# Patient Record
Sex: Female | Born: 1970 | Race: Black or African American | Hispanic: No | Marital: Married | Smoking: Never smoker
Health system: Southern US, Community
[De-identification: ages and names within clinical notes are randomized; demographics above are authoritative.]

## PROBLEM LIST (undated history)

## (undated) DIAGNOSIS — E669 Obesity, unspecified: Secondary | ICD-10-CM

## (undated) DIAGNOSIS — Z87768 Personal history of other specified (corrected) congenital malformations of integument, limbs and musculoskeletal system: Secondary | ICD-10-CM

## (undated) DIAGNOSIS — D352 Benign neoplasm of pituitary gland: Secondary | ICD-10-CM

## (undated) DIAGNOSIS — D259 Leiomyoma of uterus, unspecified: Secondary | ICD-10-CM

## (undated) DIAGNOSIS — O26899 Other specified pregnancy related conditions, unspecified trimester: Secondary | ICD-10-CM

## (undated) DIAGNOSIS — D649 Anemia, unspecified: Secondary | ICD-10-CM

## (undated) DIAGNOSIS — Z86010 Personal history of colonic polyps: Secondary | ICD-10-CM

## (undated) DIAGNOSIS — Z8776 Personal history of (corrected) congenital malformations of integument, limbs and musculoskeletal system: Secondary | ICD-10-CM

## (undated) DIAGNOSIS — R12 Heartburn: Secondary | ICD-10-CM

## (undated) DIAGNOSIS — Z8619 Personal history of other infectious and parasitic diseases: Secondary | ICD-10-CM

## (undated) DIAGNOSIS — Z5189 Encounter for other specified aftercare: Secondary | ICD-10-CM

## (undated) DIAGNOSIS — O09529 Supervision of elderly multigravida, unspecified trimester: Secondary | ICD-10-CM

## (undated) DIAGNOSIS — Z8742 Personal history of other diseases of the female genital tract: Secondary | ICD-10-CM

## (undated) DIAGNOSIS — IMO0002 Reserved for concepts with insufficient information to code with codable children: Secondary | ICD-10-CM

## (undated) DIAGNOSIS — Z8639 Personal history of other endocrine, nutritional and metabolic disease: Secondary | ICD-10-CM

## (undated) DIAGNOSIS — A630 Anogenital (venereal) warts: Secondary | ICD-10-CM

## (undated) DIAGNOSIS — I10 Essential (primary) hypertension: Secondary | ICD-10-CM

## (undated) DIAGNOSIS — T7840XA Allergy, unspecified, initial encounter: Secondary | ICD-10-CM

## (undated) HISTORY — DX: Personal history of other specified (corrected) congenital malformations of integument, limbs and musculoskeletal system: Z87.768

## (undated) HISTORY — DX: Obesity, unspecified: E66.9

## (undated) HISTORY — DX: Personal history of other endocrine, nutritional and metabolic disease: Z86.39

## (undated) HISTORY — DX: Leiomyoma of uterus, unspecified: D25.9

## (undated) HISTORY — DX: Personal history of colonic polyps: Z86.010

## (undated) HISTORY — DX: Anogenital (venereal) warts: A63.0

## (undated) HISTORY — DX: Reserved for concepts with insufficient information to code with codable children: IMO0002

## (undated) HISTORY — DX: Personal history of other diseases of the female genital tract: Z87.42

## (undated) HISTORY — PX: TUBAL LIGATION: SHX77

## (undated) HISTORY — DX: Personal history of other infectious and parasitic diseases: Z86.19

## (undated) HISTORY — DX: Allergy, unspecified, initial encounter: T78.40XA

## (undated) HISTORY — PX: COLONOSCOPY: SHX174

## (undated) HISTORY — DX: Supervision of elderly multigravida, unspecified trimester: O09.529

## (undated) HISTORY — DX: Benign neoplasm of pituitary gland: D35.2

## (undated) HISTORY — DX: Personal history of (corrected) congenital malformations of integument, limbs and musculoskeletal system: Z87.76

## (undated) SURGERY — Surgical Case
Anesthesia: *Unknown

---

## 1993-04-07 DIAGNOSIS — Z8619 Personal history of other infectious and parasitic diseases: Secondary | ICD-10-CM

## 1993-04-07 HISTORY — DX: Personal history of other infectious and parasitic diseases: Z86.19

## 1993-04-26 DIAGNOSIS — IMO0002 Reserved for concepts with insufficient information to code with codable children: Secondary | ICD-10-CM

## 1993-04-26 DIAGNOSIS — R87619 Unspecified abnormal cytological findings in specimens from cervix uteri: Secondary | ICD-10-CM

## 1993-04-26 HISTORY — DX: Reserved for concepts with insufficient information to code with codable children: IMO0002

## 1993-04-26 HISTORY — DX: Unspecified abnormal cytological findings in specimens from cervix uteri: R87.619

## 1998-10-13 ENCOUNTER — Other Ambulatory Visit: Admission: RE | Admit: 1998-10-13 | Discharge: 1998-10-13 | Payer: Self-pay | Admitting: Obstetrics and Gynecology

## 1999-10-25 DIAGNOSIS — D259 Leiomyoma of uterus, unspecified: Secondary | ICD-10-CM

## 1999-10-25 HISTORY — DX: Leiomyoma of uterus, unspecified: D25.9

## 1999-11-10 ENCOUNTER — Other Ambulatory Visit: Admission: RE | Admit: 1999-11-10 | Discharge: 1999-11-10 | Payer: Self-pay | Admitting: Obstetrics and Gynecology

## 2000-11-01 ENCOUNTER — Other Ambulatory Visit: Admission: RE | Admit: 2000-11-01 | Discharge: 2000-11-01 | Payer: Self-pay | Admitting: Obstetrics and Gynecology

## 2002-03-06 ENCOUNTER — Other Ambulatory Visit: Admission: RE | Admit: 2002-03-06 | Discharge: 2002-03-06 | Payer: Self-pay | Admitting: Obstetrics and Gynecology

## 2002-04-26 DIAGNOSIS — Z8742 Personal history of other diseases of the female genital tract: Secondary | ICD-10-CM | POA: Insufficient documentation

## 2002-04-26 HISTORY — DX: Personal history of other diseases of the female genital tract: Z87.42

## 2002-08-25 DIAGNOSIS — Z8639 Personal history of other endocrine, nutritional and metabolic disease: Secondary | ICD-10-CM | POA: Insufficient documentation

## 2002-08-25 HISTORY — DX: Personal history of other endocrine, nutritional and metabolic disease: Z86.39

## 2003-03-07 ENCOUNTER — Other Ambulatory Visit: Admission: RE | Admit: 2003-03-07 | Discharge: 2003-03-07 | Payer: Self-pay | Admitting: Obstetrics and Gynecology

## 2003-04-27 HISTORY — PX: UTERINE FIBROID SURGERY: SHX826

## 2003-05-08 ENCOUNTER — Ambulatory Visit (HOSPITAL_COMMUNITY): Admission: RE | Admit: 2003-05-08 | Discharge: 2003-05-08 | Payer: Self-pay | Admitting: Obstetrics and Gynecology

## 2003-07-26 HISTORY — PX: MYOMECTOMY: SHX85

## 2003-08-22 ENCOUNTER — Encounter (INDEPENDENT_AMBULATORY_CARE_PROVIDER_SITE_OTHER): Payer: Self-pay | Admitting: Specialist

## 2003-08-22 ENCOUNTER — Inpatient Hospital Stay (HOSPITAL_COMMUNITY): Admission: RE | Admit: 2003-08-22 | Discharge: 2003-08-25 | Payer: Self-pay | Admitting: Obstetrics and Gynecology

## 2004-03-30 ENCOUNTER — Other Ambulatory Visit: Admission: RE | Admit: 2004-03-30 | Discharge: 2004-03-30 | Payer: Self-pay | Admitting: Obstetrics and Gynecology

## 2004-04-26 DIAGNOSIS — Z5189 Encounter for other specified aftercare: Secondary | ICD-10-CM

## 2004-04-26 DIAGNOSIS — IMO0001 Reserved for inherently not codable concepts without codable children: Secondary | ICD-10-CM

## 2004-04-26 HISTORY — DX: Encounter for other specified aftercare: Z51.89

## 2004-04-26 HISTORY — DX: Reserved for inherently not codable concepts without codable children: IMO0001

## 2004-06-11 ENCOUNTER — Ambulatory Visit (HOSPITAL_COMMUNITY): Admission: RE | Admit: 2004-06-11 | Discharge: 2004-06-11 | Payer: Self-pay | Admitting: Obstetrics and Gynecology

## 2004-06-25 ENCOUNTER — Ambulatory Visit (HOSPITAL_COMMUNITY): Admission: RE | Admit: 2004-06-25 | Discharge: 2004-06-25 | Payer: Self-pay | Admitting: Obstetrics and Gynecology

## 2004-09-09 ENCOUNTER — Ambulatory Visit (HOSPITAL_COMMUNITY): Admission: RE | Admit: 2004-09-09 | Discharge: 2004-09-09 | Payer: Self-pay | Admitting: Obstetrics and Gynecology

## 2005-01-07 ENCOUNTER — Ambulatory Visit (HOSPITAL_COMMUNITY): Admission: RE | Admit: 2005-01-07 | Discharge: 2005-01-07 | Payer: Self-pay | Admitting: Obstetrics and Gynecology

## 2005-01-25 ENCOUNTER — Inpatient Hospital Stay (HOSPITAL_COMMUNITY): Admission: RE | Admit: 2005-01-25 | Discharge: 2005-01-28 | Payer: Self-pay | Admitting: Obstetrics and Gynecology

## 2005-01-25 ENCOUNTER — Encounter (INDEPENDENT_AMBULATORY_CARE_PROVIDER_SITE_OTHER): Payer: Self-pay | Admitting: Specialist

## 2005-09-09 ENCOUNTER — Other Ambulatory Visit: Admission: RE | Admit: 2005-09-09 | Discharge: 2005-09-09 | Payer: Self-pay | Admitting: Obstetrics and Gynecology

## 2005-11-10 ENCOUNTER — Ambulatory Visit: Payer: Self-pay | Admitting: Family Medicine

## 2006-01-05 ENCOUNTER — Ambulatory Visit: Payer: Self-pay | Admitting: Family Medicine

## 2006-06-20 ENCOUNTER — Ambulatory Visit: Payer: Self-pay | Admitting: Family Medicine

## 2008-09-02 ENCOUNTER — Ambulatory Visit: Payer: Self-pay | Admitting: Occupational Medicine

## 2009-07-09 ENCOUNTER — Ambulatory Visit: Payer: Self-pay | Admitting: Family Medicine

## 2009-07-10 ENCOUNTER — Encounter: Payer: Self-pay | Admitting: Family Medicine

## 2009-07-15 ENCOUNTER — Encounter: Admission: RE | Admit: 2009-07-15 | Discharge: 2009-07-15 | Payer: Self-pay | Admitting: Obstetrics and Gynecology

## 2009-08-24 LAB — HM MAMMOGRAPHY: HM Mammogram: NORMAL

## 2010-02-16 ENCOUNTER — Encounter: Payer: Self-pay | Admitting: Internal Medicine

## 2010-02-23 ENCOUNTER — Ambulatory Visit: Payer: Self-pay | Admitting: Internal Medicine

## 2010-02-23 DIAGNOSIS — E049 Nontoxic goiter, unspecified: Secondary | ICD-10-CM | POA: Insufficient documentation

## 2010-02-24 ENCOUNTER — Ambulatory Visit: Payer: Self-pay | Admitting: Internal Medicine

## 2010-02-25 ENCOUNTER — Encounter (INDEPENDENT_AMBULATORY_CARE_PROVIDER_SITE_OTHER): Payer: Self-pay | Admitting: *Deleted

## 2010-02-26 LAB — CONVERTED CEMR LAB
AST: 15 units/L (ref 0–37)
Basophils Absolute: 0 10*3/uL (ref 0.0–0.1)
CO2: 27 meq/L (ref 19–32)
Calcium: 8.8 mg/dL (ref 8.4–10.5)
Chloride: 109 meq/L (ref 96–112)
Eosinophils Relative: 1.4 % (ref 0.0–5.0)
Free T4: 0.7 ng/dL (ref 0.60–1.60)
HDL: 42.8 mg/dL (ref >39.00)
LDL Cholesterol: 55 mg/dL (ref 0–99)
Lymphocytes Relative: 22.9 % (ref 12.0–46.0)
Monocytes Relative: 5.2 % (ref 3.0–12.0)
Platelets: 276 10*3/uL (ref 150.0–400.0)
Potassium: 4.4 meq/L (ref 3.5–5.1)
RDW: 14.2 % (ref 11.5–14.6)
Sodium: 140 meq/L (ref 135–145)
T3, Free: 3 pg/mL (ref 2.3–4.2)
TSH: 1.99 microintl units/mL (ref 0.35–5.50)
Total CHOL/HDL Ratio: 3
Triglycerides: 46 mg/dL (ref 0.0–149.0)
WBC: 7.9 10*3/uL (ref 4.5–10.5)

## 2010-03-04 ENCOUNTER — Ambulatory Visit: Payer: Self-pay | Admitting: Internal Medicine

## 2010-03-05 LAB — CONVERTED CEMR LAB: Ferritin: 110.9 ng/mL (ref 10.0–291.0)

## 2010-04-03 ENCOUNTER — Encounter (INDEPENDENT_AMBULATORY_CARE_PROVIDER_SITE_OTHER): Payer: Self-pay

## 2010-04-06 ENCOUNTER — Ambulatory Visit: Payer: Self-pay | Admitting: Emergency Medicine

## 2010-04-07 ENCOUNTER — Ambulatory Visit: Payer: Self-pay | Admitting: Internal Medicine

## 2010-04-21 ENCOUNTER — Ambulatory Visit: Payer: Self-pay | Admitting: Internal Medicine

## 2010-04-21 DIAGNOSIS — Z860100 Personal history of colon polyps, unspecified: Secondary | ICD-10-CM

## 2010-04-21 HISTORY — DX: Personal history of colonic polyps: Z86.010

## 2010-04-21 HISTORY — DX: Personal history of colon polyps, unspecified: Z86.0100

## 2010-04-22 ENCOUNTER — Encounter: Payer: Self-pay | Admitting: Internal Medicine

## 2010-05-14 ENCOUNTER — Ambulatory Visit (HOSPITAL_COMMUNITY)
Admission: RE | Admit: 2010-05-14 | Discharge: 2010-05-14 | Payer: Self-pay | Source: Home / Self Care | Attending: Obstetrics and Gynecology | Admitting: Obstetrics and Gynecology

## 2010-05-28 NOTE — Miscellaneous (Signed)
Summary: Lec previsit  Clinical Lists Changes  Medications: Added new medication of MOVIPREP 100 GM  SOLR (PEG-KCL-NACL-NASULF-NA ASC-C) As per prep instructions. - Signed Rx of MOVIPREP 100 GM  SOLR (PEG-KCL-NACL-NASULF-NA ASC-C) As per prep instructions.;  #1 x 0;  Signed;  Entered by: Ulis Rias RN;  Authorized by: Iva Boop MD, [facility];  Method used: Electronically to [facility] [phone]*, [address] [place], [place]  [address], Ph: [phone], Fax: [phone] Observations: Added new observation of NKA: T (04/07/2010 10:04)    Prescriptions: MOVIPREP 100 GM  SOLR (PEG-KCL-NACL-NASULF-NA ASC-C) As per prep instructions.  #1 x 0   Entered by:   Ulis Rias RN   Authorized by:   Iva Boop MD, [facility]   Signed by:   Ulis Rias RN on 04/07/2010   Method used:   Electronically to        [facility] [phone]* (retail)       [address] [place], [place]  [address]       Ph: [phone]       Fax: [phone]   RxID:   [id number]

## 2010-05-28 NOTE — Letter (Signed)
Summary: Pre Visit Letter Revised  [facility]  [address] [place], [place] [address]   Phone: [phone]  Fax: [phone]        02/25/2010 MRN: [id number] Felicia Monroe [address] [name], [place]  [address]             Procedure Date:  04-21-10   Welcome to the Gastroenterology Division at [facility]    You are scheduled to see a nurse for your pre-procedure visit on 04-07-10 at 10:00a.m. on the 3rd floor at [facility], [address]  We ask that you try to arrive at our office 15 minutes prior to your appointment time to allow for check-in.  Please take a minute to review the attached form.  If you answer "Yes" to one or more of the questions on the first page, we ask that you call the person listed at your earliest opportunity.  If you answer "No" to all of the questions, please complete the rest of the form and bring it to your appointment.    Your nurse visit will consist of discussing your medical and surgical history, your immediate family medical history, and your medications.   If you are unable to list all of your medications on the form, please bring the medication bottles to your appointment and we will list them.  We will need to be aware of both prescribed and over the counter drugs.  We will need to know exact dosage information as well.    Please be prepared to read and sign documents such as consent forms, a financial agreement, and acknowledgement forms.  If necessary, and with your consent, a friend or relative is welcome to sit-in on the nurse visit with you.  Please bring your insurance card so that we may make a copy of it.  If your insurance requires a referral to see a specialist, please bring your referral form from your primary care physician.  No co-pay is required for this nurse visit.     If you cannot keep your appointment, please call [phone] to cancel or reschedule prior to your appointment date.   This allows [place] the opportunity to schedule an appointment for another patient in need of care.    Thank you for choosing [facility] for your medical needs.  We appreciate the opportunity to care for you.  Please visit [place] at our website  to learn more about our practice.  Sincerely, The Gastroenterology Division

## 2010-05-28 NOTE — Assessment & Plan Note (Signed)
Summary: BACK PAIN/STUFFY NOSE Room 4   Vital Signs:  Patient Profile:   40 Years Old Female CC:      Lower back pain x 1 wk/Congestion, cough x 3 wks LMP:     03/30/2010 Height:     65 inches Weight:      284 pounds O2 Sat:      100 % O2 treatment:    Room Air Temp:     97.6 degrees F oral Pulse rate:   85 / minute Pulse rhythm:   regular Resp:     16 per minute BP sitting:   136 / 84  (left arm) Cuff size:   large  Pt. in pain?   yes    Location:   lower back    Intensity:   4    Type:       dull  Vitals Entered By: Emilio Math (April 06, 2010 10:28 AM)  Menstrual History: LMP (date): 03/30/2010                   Current Allergies (reviewed today): No known allergies History of Present Illness Chief Complaint: Lower back pain x 1 wk/Congestion, cough x 3 wks History of Present Illness: Patient complains of onset of cold symptoms for 3 wks.  They have been using Claritin-D and Mucinex-DM which is helping a little bit. No sore throat No cough No pleuritic pain No wheezing + nasal congestion + post-nasal drainage + sinus pain/pressure No itchy/red eyes No earache No hemoptysis No SOB No chills/sweats No fever No nausea No vomiting No abdominal pain No diarrhea No skin rashes No fatigue No myalgias No headache   Also c/o back pain, left sided, doesn't radiate.  Happened last week when stepping out of shower, felt it freeze up and tighten.  Pain and discomfort since then.  No B/B dyxfx or saddle anesthesia.  No UTI symptoms noted  Current Meds * PRENATAL VITAMIN once a day LORATADINE 10 MG TABS (LORATADINE)  MUCINEX DM 30-600 MG XR12H-TAB (DEXTROMETHORPHAN-GUAIFENESIN)  ZITHROMAX Z-PAK 250 MG TABS (AZITHROMYCIN) use as directed MELOXICAM 7.5 MG TABS (MELOXICAM) 1 by mouth two times a day as needed for pain FLEXERIL 5 MG TABS (CYCLOBENZAPRINE HCL) 1 by mouth three times a day as needed for spasms  REVIEW OF SYSTEMS Constitutional Symptoms  Denies fever, chills, night sweats, weight loss, weight gain, and fatigue.  Eyes       Denies change in vision, eye pain, eye discharge, glasses, contact lenses, and eye surgery. Ear/Nose/Throat/Mouth       Complains of sinus problems.      Denies hearing loss/aids, change in hearing, ear pain, ear discharge, dizziness, frequent runny nose, frequent nose bleeds, sore throat, hoarseness, and tooth pain or bleeding.  Respiratory       Denies dry cough, productive cough, wheezing, shortness of breath, asthma, bronchitis, and emphysema/COPD.  Cardiovascular       Denies murmurs, chest pain, and tires easily with exhertion.    Gastrointestinal       Denies stomach pain, nausea/vomiting, diarrhea, constipation, blood in bowel movements, and indigestion. Genitourniary       Denies painful urination, kidney stones, and loss of urinary control. Neurological       Denies paralysis, seizures, and fainting/blackouts. Musculoskeletal       Complains of muscle pain.      Denies joint pain, joint stiffness, decreased range of motion, redness, swelling, muscle weakness, and gout.  Skin  Denies bruising, unusual mles/lumps or sores, and hair/skin or nail changes.  Psych       Denies mood changes, temper/anger issues, anxiety/stress, speech problems, depression, and sleep problems.  Past History:  Past Medical History: Reviewed history from 06/08/2006 and no changes required. Unremarkable  Past Surgical History: Reviewed history from 09/02/2008 and no changes required. c-section fibroid removal 2005  Family History: Reviewed history from 02/23/2010 and no changes required. breast ca-- no colon ca-- F , dx in his 5s  colon polyps-- B x 2 (2nd time at age 51) and sister  CAD-- aunts  DM--M, S, aunt  HTN-- M, F , B  Social History: Reviewed history from 02/23/2010 and no changes required. 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  Back FROM.  No midline tenderness.  L paraspinal spasms felt upper lumbar/lower thoracic Assessment New Problems: UPPER RESPIRATORY INFECTION, ACUTE (ICD-465.9) LOWER BACK PAIN (ICD-724.2)  likely viral URI   Patient Education: Patient and/or caregiver instructed in the following: rest, fluids, Tylenol prn.  Plan New Medications/Changes: FLEXERIL 5 MG TABS (CYCLOBENZAPRINE HCL) 1 by mouth three times a day as needed for spasms  #15 x 0, 04/06/2010, Hoyt Koch MD MELOXICAM 7.5 MG TABS (MELOXICAM) 1 by mouth two times a day as needed for pain  #30 x 0, 04/06/2010, Hoyt Koch MD ZITHROMAX Z-PAK 250 MG TABS (AZITHROMYCIN) use as directed  #1 x 0, 04/06/2010, Hoyt Koch MD  New Orders: Est. Patient Level IV [78469] UA Dipstick w/o Micro (automated)  [81003] Planning Comments:   1)  Take the prescribed antibiotic as instructed.  Hold Zpak for at least 5 days. 2)  Use nasal saline solution (over the counter) at least 3 times a day. 3)  Use over the counter decongestants like Zyrtec-D every 12 hours as needed to help with congestion. 4)  Can take tylenol every 6 hours or motrin every 8 hours for pain or fever. 5)  Follow up with your primary doctor  if no improvement in 5-7 days, sooner if increasing pain, fever, or new symptoms.  6) For your back pain, use a heating pad and the prescriptions as directed.  Massage and rest, but do work on gentle ROM.  If getting worse, if blood in urine, if UTI  symptoms, then please follow up with a physician.    The patient and/or caregiver has been counseled thoroughly with regard to medications prescribed including dosage, schedule, interactions, rationale for use, and possible side effects and they verbalize understanding.  Diagnoses and expected course of recovery discussed and will return if not improved as expected or if the condition worsens. Patient and/or caregiver verbalized understanding.  Prescriptions: FLEXERIL 5 MG TABS (CYCLOBENZAPRINE HCL) 1 by mouth three times a day as needed for spasms  #15 x 0   Entered and Authorized by:   Hoyt Koch MD   Signed by:   Hoyt Koch MD on 04/06/2010   Method used:   Print then Give to Patient   RxID:   [id number] MELOXICAM 7.5 MG  TABS (MELOXICAM) 1 by mouth two times a day as needed for pain  #30 x 0   Entered and Authorized by:   Hoyt Koch MD   Signed by:   Hoyt Koch MD on 04/06/2010   Method used:   Print then Give to Patient   RxID:   [id number] ZITHROMAX Z-PAK 250 MG TABS (AZITHROMYCIN) use as directed  #1 x 0   Entered and Authorized by:   Hoyt Koch MD   Signed by:   Hoyt Koch MD on 04/06/2010   Method used:   Print then Give to Patient   RxID:   [id number]   Orders Added: 1)  Est. Patient Level IV [32440] 2)  UA Dipstick w/o Micro (automated)  [81003]  Appended Document: BACK PAIN/STUFFY NOSE Room 4 UA: GLU: Neg BIL: Neg KET: Trace SG: 1.020 BLO: 1+ pH: 7.0 PRO: Neg URO: 1.0 NIT: Neg LEU: Neg

## 2010-05-28 NOTE — Letter (Signed)
Summary: Patient Notice- Polyp Results  [facility]  [address] [place], [place] [address]   Phone: [phone]  Fax: [phone]        April 22, 2010 MRN: [id number]    [facility] [address] [place], [place]  [address]    Dear Ms. 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 This means that they were pre-cancerous or that they had the potential to change into cancer over time.   I recommend that you have a repeat colonoscopy in 3 years to determine if you have developed any new polyps over time and screen for colorectal cancer. If you develop any new rectal bleeding, abdominal pain or significant bowel habit changes, please contact us before then.  In addition to repeating colonoscopy, changing health habits may reduce your risk of having more colon or rectal  polyps and possibly, colorectal cancer. You may lower your risk of future polyps and colorectal cancer by adopting healthy habits such as not smoking or using tobacco (if you do), being physically active, losing weight (if overweight), and eating a diet which includes fruits and vegetables and limits red meat.  Please call us if you are having persistent problems or have questions about your condition that have not been fully answered at this time.  Sincerely,  Iva Boop MD, [facility]  This letter has been electronically signed by your physician.  Appended Document: Patient Notice- Polyp Results Letter mailed

## 2010-05-28 NOTE — Assessment & Plan Note (Signed)
Summary: cpx/cbs   Vital Signs:  Patient profile:   40 year old female Height:      65 inches Weight:      283 pounds BMI:     47.26 Pulse rate:   91 / minute Pulse rhythm:   regular BP sitting:   132 / 84  (left arm) Cuff size:   large  Vitals Entered By: Army Fossa CMA (February 23, 2010 1:26 PM) CC: CPX, not fasting Comments having Ha's off and on [facility] due for pap in dec   History of Present Illness: last office visit w/ [place]  [facility] care more than 3 years ago NEW PATIENT CPX   Preventive Screening-Counseling & Management  Alcohol-Tobacco     Smoking Status: never  Caffeine-Diet-Exercise     Does Patient Exercise: no  Current Medications (verified): 1)  Prenatal Vitamin .... Once A Day  Allergies (verified): No Known Drug Allergies  Past History:  Past Medical History: Reviewed history from 06/08/2006 and no changes required. Unremarkable  Past Surgical History: Reviewed history from 09/02/2008 and no changes required. 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 CV:  Denies chest pain or discomfort and swelling of feet. Resp:  Denies cough and shortness of breath. GI:  Denies bloody stools, diarrhea, nausea, and vomiting. GU:  Denies dysuria and hematuria. Neuro:  HA from time to time , last month she had frequent headaches. They were dull, lasted about one day, no associated nausea, phono or photophobia. There were no there worse headache of her life. She is back to normal now. Endo:  her previous MD found her to have a large  thyroid gland, ultrasound showed thyromegaly with less than 1 cm nodules.  Physical Exam  General:  alert, well-developed, and overweight-appearing.   Head:  face symmetric Ears:  R ear normal and L ear normal.   Nose:  not congested Mouth:  no redness or discharge Neck:  barely palpable thyroid gland, nontender, no nodular Lungs:  normal respiratory effort, no intercostal retractions, no accessory muscle use, and normal breath sounds.   Heart:  normal rate, regular rhythm, and no murmur.   Abdomen:  soft, non-tender, no distention, no masses, no guarding, and no rigidity.   Extremities:  no pretibial edema bilaterally  Neurologic:  speech, gait and motor are intact Psych:  Oriented X3, memory intact for recent and remote, normally interactive, good eye contact, not anxious appearing, and not depressed appearing.     Impression & Recommendations:  Problem # 1:  ROUTINE GENERAL MEDICAL EXAM@HEALTH  CARE FACL (ICD-V70.0) Td last ? we agreed to do it next year  Had a flu shot already  sees gyn [facility]  ~ 08-2009  never had a Cscope , +FH, will refer to [facility] GI diet and exercise discussed  Orders: Gastroenterology Referral (GI)  Problem # 2:  GOITER (ICD-240.9) slightly enlarged thuroid gland  by physical exam  Ultrasound done few  days ago to thyromegaly with less than 1 cm nodules. Plan: TFTs Repeat ultrasound in 6  months  Complete Medication List: 1)  Prenatal Vitamin  .... Once a day  Patient Instructions: 1)  Fasting 2)  FLP, CBC, BMP, AST, ALT--v70 3)  TSH, free T3, free T4 --- dx goiter 4)  Please schedule a follow-up appointment in 6 months .    Orders Added: 1)  Gastroenterology Referral [GI] 2)  New Patient 40-64 years [99386]   Immunization History:  Influenza Immunization History:    Influenza:  historical (02/12/2010)   Immunization History:  Influenza Immunization History:    Influenza:  Historical (02/12/2010)   Risk Factors:  Tobacco use:   never Alcohol use:  no Exercise:  no  Mammogram History:     Date of Last Mammogram:  08/24/2009    Results:  normal-per pt     Preventive Care Screening  Mammogram:    Date:  08/24/2009    Results:  normal-per pt

## 2010-05-28 NOTE — Procedures (Signed)
Summary: Colonoscopy  Patient: Kanyia Heaslip Note: All result statuses are Final unless otherwise noted.  Tests: (1) Colonoscopy (COL)   COL Colonoscopy           DONE     [facility]     [address]     [place], [place]  [address]           COLONOSCOPY PROCEDURE REPORT           PATIENT:  Lace, Chenevert  MR#:  [id number]     BIRTHDATE:  [birth year], 39 yrs. old  GENDER:  female     ENDOSCOPIST:  Iva Boop, MD, [facility]     REF. BY:  Willow Ora, M.D.     PROCEDURE DATE:  04/21/2010     PROCEDURE:  Colonoscopy with snare polypectomy     ASA CLASS:  Class II     INDICATIONS:  Elevated Risk Screening, family history of colon     cancer, family Hx of polyps Father had colon cancer in early 61's,     brother and sister with polyps (40's-50's) - but not polyposis it     seems     MEDICATIONS:   Fentanyl 50 mcg IV, Versed 5 mg IV           DESCRIPTION OF PROCEDURE:   After the risks benefits and     alternatives of the procedure were thoroughly explained, informed     consent was obtained.  Digital rectal exam was performed and     revealed no abnormalities.   The LB CF-[id number] endoscope     was introduced through the anus and advanced to the cecum, which     was identified by both the appendix and ileocecal valve, without     limitations.  The quality of the prep was excellent, using     MoviPrep.  The instrument was then slowly withdrawn as the colon     was fully examined. Insertion: 1:40 minutes Withdrawal: 9:54     minutes     <<PROCEDUREIMAGES>>           FINDINGS:  Two polyps were found in the cecum. They were     diminutive. Polyps were snared without cautery. Retrieval was     successful. This was otherwise a normal examination of the colon.     Retroflexed views in the right colon and rectum revealed no     abnormalities.    The scope was then withdrawn from the patient     and the procedure completed.           COMPLICATIONS:  None     ENDOSCOPIC  IMPRESSION:     1) Two diminutive polyps removed from the cecum     2) Otherwise normal examination           REPEAT EXAM:  In for Colonoscopy, pending biopsy results.           Iva Boop, MD, [facility]           CC:  Willow Ora, MD     The Patient           n.     eSIGNED:   Iva Boop at 04/21/2010 09:46 AM           Salley Scarlet, [id number]  Note: An exclamation mark (!) indicates a result that was not dispersed into the flowsheet. Document Creation Date: 04/21/2010 9:47 AM _______________________________________________________________________  (1) Order result  status: Final Collection or observation date-time: 04/21/2010 09:38 Requested date-time:  Receipt date-time:  Reported date-time:  Referring Physician:   Ordering Physician: Stan Head [phone]) Specimen Source:  Source: Launa Grill Order Number: [id number] Lab site:   Appended Document: Colonoscopy   Colonoscopy  Procedure date:  04/21/2010  Findings:          1) Two diminutive polyps removed from the cecum     2) Otherwise normal examination   1. Colon, polyp(s), cecum :  -  TUBULAR ADENOMA (ONE FRAGMENT) -  SESSILE SERRATED POLYPS (THREE FRAGMENTS) -  NO EVIDENCE OF HIGH GRADE DYSPLASIA OR MALIGNANCY  Comments:      Repeat colonoscopy in 3 years.     Procedures Next Due Date:    Colonoscopy: 04/2013   Appended Document: Colonoscopy     Procedures Next Due Date:    Colonoscopy: 03/2013

## 2010-05-28 NOTE — Letter (Signed)
Summary: [facility] Instructions  [facility]  [address] [place], [place] [address]   Phone: [phone]  Fax: [phone]       Felicia Monroe    22-Dec-1970    MRN: [id number]        Procedure Day /Date:  Tuesday 04/21/2010     Arrival Time:  8:00 am      Procedure Time:  9:00 am     Location of Procedure:                    _x _  [facility] (4th Floor)                        PREPARATION FOR COLONOSCOPY WITH MOVIPREP   Starting 5 days prior to your procedure Thursday 12/22 do not eat nuts, seeds, popcorn, corn, beans, peas,  salads, or any raw vegetables.  Do not take any fiber supplements (e.g. Metamucil, Citrucel, and Benefiber).  THE DAY BEFORE YOUR PROCEDURE         DATE: Monday 12/26  1.  Drink clear liquids the entire day-NO SOLID FOOD  2.  Do not drink anything colored red or purple.  Avoid juices with pulp.  No orange juice.  3.  Drink at least 64 oz. (8 glasses) of fluid/clear liquids during the day to prevent dehydration and help the prep work efficiently.  CLEAR LIQUIDS INCLUDE: Water Jello Ice Popsicles Tea (sugar ok, no milk/cream) Powdered fruit flavored drinks Coffee (sugar ok, no milk/cream) Gatorade Juice: apple, white grape, white cranberry  Lemonade Clear bullion, consomm, broth Carbonated beverages (any kind) Strained chicken noodle soup Hard Candy                             4.  In the morning, mix first dose of MoviPrep solution:    Empty 1 Pouch A and 1 Pouch B into the disposable container    Add lukewarm drinking water to the top line of the container. Mix to dissolve    Refrigerate (mixed solution should be used within 24 hrs)  5.  Begin drinking the prep at 5:00 p.m. The MoviPrep container is divided by 4 marks.   Every 15 minutes drink the solution down to the next mark (approximately 8 oz) until the full liter is complete.   6.  Follow completed prep with 16 oz of clear liquid of your choice  (Nothing red or purple).  Continue to drink clear liquids until bedtime.  7.  Before going to bed, mix second dose of MoviPrep solution:    Empty 1 Pouch A and 1 Pouch B into the disposable container    Add lukewarm drinking water to the top line of the container. Mix to dissolve    Refrigerate  THE DAY OF YOUR PROCEDURE      DATE: Tuesday 12/27  Beginning at 4:00 a.m. (5 hours before procedure):         1. Every 15 minutes, drink the solution down to the next mark (approx 8 oz) until the full liter is complete.  2. Follow completed prep with 16 oz. of clear liquid of your choice.    3. You may drink clear liquids until 7:00 am (2 HOURS BEFORE PROCEDURE).   MEDICATION INSTRUCTIONS  Unless otherwise instructed, you should take regular prescription medications with a small sip of water   as early as possible the  morning of your procedure.         OTHER INSTRUCTIONS  You will need a responsible adult at least 42 years of age to accompany you and drive you home.   This person must remain in the waiting room during your procedure.  Wear loose fitting clothing that is easily removed.  Leave jewelry and other valuables at home.  However, you may wish to bring a book to read or  an iPod/MP3 player to listen to music as you wait for your procedure to start.  Remove all body piercing jewelry and leave at home.  Total time from sign-in until discharge is approximately 2-3 hours.  You should go home directly after your procedure and rest.  You can resume normal activities the  day after your procedure.  The day of your procedure you should not:   Drive   Make legal decisions   Operate machinery   Drink alcohol   Return to work  You will receive specific instructions about eating, activities and medications before you leave.    The above instructions have been reviewed and explained to me by   Ulis Rias RN  April 07, 2010 10:20 AM     I fully understand  and can verbalize these instructions _____________________________ Date _________

## 2010-05-28 NOTE — Assessment & Plan Note (Signed)
Summary: SORE THROAT & HEADACHE/KH   Vital Signs:  Patient Profile:   40 Years Old Female CC:      Sore throat, headaches x 2 days-son dx with strep yesterday Height:     67 inches Weight:      280 pounds O2 Sat:      100 % O2 treatment:    Room Air Temp:     98.1 degrees F oral Pulse rate:   78 / minute Pulse rhythm:   regular Resp:     18 per minute BP sitting:   146 / 91  (left arm) Cuff size:   large  Vitals Entered By: Avel Sensor, CMA                  Prior Medication List:  * PRENATAL VITAMIN once a day FLONASE 50 MCG/ACT SUSP (FLUTICASONE PROPIONATE) 2 sprays each nostril once a day   Current Allergies (reviewed today): No known allergies History of Present Illness Chief Complaint: Sore throat, headaches x 2 days-son dx with strep yesterday History of Present Illness: Patient is here due to a sore throat nnow of 2 days duration. Her son was DX 3 days ago. She also has general malaise and a headache that has been going on fo atkeast  week.  The headache was catorgorized as a 3/10 or mild.   Current Problems: ACUTE SINUSITIS, UNSPECIFIED (ICD-461.9) HEADACHE (ICD-784.0) ACUTE NASOPHARYNGITIS (ICD-460)   Current Meds * PRENATAL VITAMIN once a day FLONASE 50 MCG/ACT SUSP (FLUTICASONE PROPIONATE) 2 sprays each nostril once a day * CLARITIN -D 24HRS,OR ALLEGRA-D 24HRS sig 1 by mouth qday AUGMENTIN 875-125 MG TABS (AMOXICILLIN-POT CLAVULANATE) 1 by mouth 2 times daily  REVIEW OF SYSTEMS Constitutional Symptoms      Denies fever, chills, night sweats, weight loss, weight gain, and fatigue.  Eyes       Denies change in vision, eye pain, eye discharge, glasses, contact lenses, and eye surgery. Ear/Nose/Throat/Mouth       Complains of sore throat.      Denies hearing loss/aids, change in hearing, ear pain, ear discharge, dizziness, frequent runny nose, frequent nose bleeds, sinus problems, hoarseness, and tooth pain or bleeding.  Respiratory       Denies dry  cough, productive cough, wheezing, shortness of breath, asthma, bronchitis, and emphysema/COPD.  Cardiovascular       Denies murmurs, chest pain, and tires easily with exhertion.    Gastrointestinal       Denies stomach pain, nausea/vomiting, diarrhea, constipation, blood in bowel movements, and indigestion. Genitourniary       Denies painful urination, kidney stones, and loss of urinary control. Neurological       Complains of headaches.      Denies paralysis, seizures, and fainting/blackouts. Musculoskeletal       Denies muscle pain, joint pain, joint stiffness, decreased range of motion, redness, swelling, muscle weakness, and gout.  Skin       Denies bruising, unusual mles/lumps or sores, and hair/skin or nail changes.  Psych       Denies mood changes, temper/anger issues, anxiety/stress, speech problems, depression, and sleep problems.  Past History:  Family History: Last updated: 09/02/2008 mother - diabetes and high blood pressure father  - high blood pressure and previous colon cancer brother - high blood pressure sister - healthy  Social History: Last updated: 09/02/2008 denies smoking, drinking or recreational drug use  Past Medical History: Reviewed history from 06/08/2006 and no changes required. Unremarkable  Past Surgical  History: Reviewed history from 09/02/2008 and no changes required. c-section fibroid removal 2005  Family History: Reviewed history from 09/02/2008 and no changes required. mother - diabetes and high blood pressure father  - high blood pressure and previous colon cancer brother - high blood pressure sister - healthy  Social History: Reviewed history from 09/02/2008 and no changes required. denies smoking, drinking or recreational drug use Physical Exam General appearance: well developed, well nourished,mild  distress Head: normocephalic, atraumatic Ears: normal, no lesions or deformities Nasal: swollen red turbinates with  congestion Oral/Pharynx: tongue normal, posterior pharynx without erythema or exudate Neck: neck supple,  trachea midline, no masses Neurological: grossly intact and non-focal Skin: no obvious rashes or lesions MSE: oriented to time, place, and person Assessment New Problems: ACUTE SINUSITIS, UNSPECIFIED (ICD-461.9) HEADACHE (ICD-784.0) ACUTE NASOPHARYNGITIS (ICD-460)  sinusitis  strep exposure  Patient Education: Patient and/or caregiver instructed in the following: rest fluids and Tylenol.  Plan New Medications/Changes: AUGMENTIN 875-125 MG TABS (AMOXICILLIN-POT CLAVULANATE) 1 by mouth 2 times daily  #20 x 0, 07/09/2009, Hassan Rowan MD CLARITIN -D 24HRS,OR ALLEGRA-D 24HRS sig 1 by mouth qday  #30 x 0, 07/09/2009, Hassan Rowan MD  New Orders: Rapid Strep [04540] T-Culture, Rapid Strep [[id number]] New Patient Level III [95621] Planning Comments:   strep culture pending  Follow Up: Follow up in 2-3 days if no improvement, Follow up on an as needed basis, Follow up with Primary Physician  The patient and/or caregiver has been counseled thoroughly with regard to medications prescribed including dosage, schedule, interactions, rationale for use, and possible side effects and they verbalize understanding.  Diagnoses and expected course of recovery discussed and will return if not improved as expected or if the condition worsens. Patient and/or caregiver verbalized understanding.  Prescriptions: AUGMENTIN 875-125 MG TABS (AMOXICILLIN-POT CLAVULANATE) 1 by mouth 2 times daily  #20 x 0   Entered and Authorized by:   Hassan Rowan MD   Signed by:   Hassan Rowan MD on 07/09/2009   Method used:   Printed then faxed to ...       [facility] [phone]* (retail)       [address] [place], [place]  [address]       Ph: [phone]       Fax: [phone]   RxID:   [id number] CLARITIN -D 24HRS,OR ALLEGRA-D 24HRS sig 1 by mouth qday  #30 x 0   Entered and Authorized by:    Hassan Rowan MD   Signed by:   Hassan Rowan MD on 07/09/2009   Method used:   Printed then faxed to ...       [facility] [phone]* (retail)       [address] [place], [place]  [address]       Ph: [phone]       Fax: [phone]   RxID:   [id number]   Patient Instructions: 1)  Please schedule a follow-up appointment as needed. 2)  Please schedule an appointment with your primary doctor in :3 3)  Take your antibiotic as prescribed until ALL of it is gone, but stop if you develop a rash or swelling and contact our office as soon as possible. 4)  Acute sinusitis symptoms for less than 10 days are not helped by antibiotics.Use warm moist compresses, and over the counter decongestants ( only as directed). Call if no improvement in 5-7 days, sooner if increasing  pain, fever, or new symptoms.  Laboratory Results  Date/Time Received: July 09, 2009 11:29 AM  Date/Time Reported: July 09, 2009 11:29 AM   Other Tests  Rapid Strep: negative

## 2010-06-06 ENCOUNTER — Encounter: Payer: Self-pay | Admitting: Internal Medicine

## 2010-07-27 ENCOUNTER — Encounter: Payer: Self-pay | Admitting: Internal Medicine

## 2010-07-27 ENCOUNTER — Ambulatory Visit (INDEPENDENT_AMBULATORY_CARE_PROVIDER_SITE_OTHER): Payer: BC Managed Care – PPO | Admitting: Internal Medicine

## 2010-07-27 DIAGNOSIS — M549 Dorsalgia, unspecified: Secondary | ICD-10-CM | POA: Insufficient documentation

## 2010-07-27 DIAGNOSIS — E049 Nontoxic goiter, unspecified: Secondary | ICD-10-CM

## 2010-07-27 HISTORY — DX: Dorsalgia, unspecified: M54.9

## 2010-07-27 NOTE — Patient Instructions (Signed)
Check your BP twice a week Call if BP is > 145/85 consistently

## 2010-07-27 NOTE — Assessment & Plan Note (Signed)
Back pain, continue with Mobic and Flexeril as needed, if symptoms increase will refer to ortho or physical therapy

## 2010-07-27 NOTE — Progress Notes (Signed)
  Subjective:    Patient ID: Felicia Monroe, female    DOB: 09-May-1970, 40 y.o.   MRN: [id number]  HPI F/u on goiter, see last OV Also complaining of back pain, right-sided, no radiation, episodes happen ~ once a month. Saw another practitioner, prescribed Mobic and Flexeril which helped.    Review of Systems No dysuria or gross hematuria Her BPs found to be slightly high today, it has been okay at other medical offices. Menopause? Having hot flashes, encouraged to discuss her with her gynecologist    Objective:   Physical Exam  Constitutional: She is oriented to person, place, and time. She appears well-developed.       Morbidly obese  Neck:       Thyroid nonpalpable  Cardiovascular: Normal rate, regular rhythm and normal heart sounds.   Pulmonary/Chest: Effort normal and breath sounds normal. No respiratory distress.  Musculoskeletal: She exhibits no edema.       Tender in the back to palpation. No CVA tenderness  Neurological: She is alert and oriented to person, place, and time.       No motor deficits DTRs normal          Assessment & Plan:

## 2010-07-27 NOTE — Assessment & Plan Note (Signed)
Goiter: Thyroid gland was barely palpable @ last OV, today it is not palpable,all TFTs were (-) Plan observation

## 2010-07-30 ENCOUNTER — Other Ambulatory Visit: Payer: Self-pay | Admitting: Obstetrics and Gynecology

## 2010-07-30 DIAGNOSIS — Z0000 Encounter for general adult medical examination without abnormal findings: Secondary | ICD-10-CM

## 2010-08-11 ENCOUNTER — Ambulatory Visit
Admission: RE | Admit: 2010-08-11 | Discharge: 2010-08-11 | Disposition: A | Discharge status: Home / Self Care | Payer: BC Managed Care – PPO | Source: Ambulatory Visit | Attending: Obstetrics and Gynecology | Admitting: Obstetrics and Gynecology

## 2010-08-11 DIAGNOSIS — Z0000 Encounter for general adult medical examination without abnormal findings: Secondary | ICD-10-CM

## 2010-08-25 DIAGNOSIS — D369 Benign neoplasm, unspecified site: Secondary | ICD-10-CM

## 2010-08-25 HISTORY — DX: Benign neoplasm, unspecified site: D36.9

## 2010-09-11 NOTE — Discharge Summary (Signed)
NAME:  Felicia Monroe, Felicia Monroe              ACCOUNT NO.:  [id number]   MEDICAL RECORD NO.:  [id number]          PATIENT TYPE:  INP   LOCATION:  9125                          FACILITY:  [facility]   PHYSICIAN:  Crist Fat. Rivard, M.D. DATE OF BIRTH:  1971/02/22   DATE OF ADMISSION:  01/25/2005  DATE OF DISCHARGE:                                 DISCHARGE SUMMARY   ADMITTING DIAGNOSES:  1.  Intrauterine pregnancy at 38-and-a-half weeks.  2.  Prior myomectomy.  3.  Fibroids.   PROCEDURE:  Primary low transverse cesarean section.   DISCHARGE DIAGNOSES:  1.  Intrauterine pregnancy at term, delivered.  2.  Prior myomectomy.  3.  Fibroids.  4.  Primary low transverse cesarean section.  5.  Pelvic adhesions.   [name] is a 40 year old gravida 1 para 0 who presents at 38-and-a-half  weeks gestation for primary low transverse cesarean section secondary to  history of prior myomectomy. This was accomplished on January 25, 2005 by Dr.  Marline Backbone with the birth of a 7-pound 8-ounce female infant named Selena Batten  with Apgar scores of 8 at one minute and 9 at five minutes. Both patient and  infant have done well in the postoperative period. The patient's hemoglobin  on the first postoperative day was 7.3. She has been asymptomatic in her  anemia and declined transfusion. Her vital signs have remained stable. She  is afebrile. Her incision is clean, dry and intact. On this her third  postoperative day, her JP drain was removed easily without any difficulty.  It collected only a small amount of serosanguineous drainage in the past 24  hours. The patient is stable and ready for discharge.   DISCHARGE INSTRUCTIONS:  Per [facility] handout.   DISCHARGE MEDICATIONS:  1.  Motrin 600 mg p.o. q.6h. p.r.n. pain.  2.  Tylox one to two p.o. q.3-4h. pain.  3.  Prenatal vitamins.   The patient will follow up in the office of [facility] in 6 weeks      Rica Koyanagi, C.N.M.      Crist Fat Rivard,  M.D.  Electronically Signed    SDM/MEDQ  D:  01/28/2005  T:  01/28/2005  Job:  [id number]

## 2010-09-11 NOTE — Op Note (Signed)
NAME:  Felicia Monroe, Felicia Monroe                        ACCOUNT NO.:  [id number]   MEDICAL RECORD NO.:  [id number]                   PATIENT TYPE:  INP   LOCATION:  9399                                 [facility]ACILITY:  [facility]   PHYSICIAN:  Janine Limbo, M.D.            DATE OF BIRTH:  1970/12/07   DATE OF PROCEDURE:  08/22/2003  DATE OF DISCHARGE:                                 OPERATIVE REPORT   PREOPERATIVE DIAGNOSES:  1. Menorrhagia.  2. 14-week size fibroids.  3. Obesity (weight 267 pounds).  4. Anemia (hemoglobin 11.8).   POSTOPERATIVE DIAGNOSES:  1. Menorrhagia.  2. 20-week size fibroids.  3. Obesity (weight 267 pounds).  4. Anemia (hemoglobin 11.8).   PROCEDURES:  1. Multiple abdominal myomectomy.  2. Placement of a Jackson-Pratt drain.   SURGEON:  Janine Limbo, M.D.   FIRST ASSISTANT:  Elmira J. Powell, P.A.C.   ANESTHESIA:  General anesthesia.   INDICATIONS FOR PROCEDURE:  Felicia Monroe is a 40 year old female, gravida 0,  who presents with the above mentioned diagnoses.  She understands the  indications for her procedure and she accepts the risks of, but not limited  to, anesthetic complications, bleeding, infections, and possible damage to  the surrounding organs.   FINDINGS:  The uterus was 20-week size and there were multiple very large  fibroids.  The largest fibroid measured approximately 14 cm in size and it  was pedunculated at the left fundus.  There were multiple subserosal,  intramural, and submucosal fibroids present.  The fallopian tubes and the  ovaries appear normal.  The bowel and the other pelvic structures appeared  normal.  The estimated weight of the fibroids was 2784 g.   DESCRIPTION OF PROCEDURE:  The patient was taken to the operating room where  a general anesthetic was given.  The patient's abdomen, perineum, and vagina  were prepped with multiple layers of Betadine.  A Foley catheter was placed  in the bladder.  The patient was sterilely  draped.  The lower abdomen was  injected with 20 mL of 0.5% Marcaine with epinephrine.  A low transverse  incision was made and carried sharply through the subcutaneous tissue, the  fascia and the anterior peritoneum.  The pelvic organs were examined with  findings as mentioned above.  The uterus was much larger than we had  anticipated.  The low transverse incision in the abdomen was then extended.  The fascia was also extended.  Attempts were made to rotate the large  pedunculated fibroid through the incision but these attempts were  unsuccessful.  We then began to morcellate the large pedunculated uterus.  Bleeding was brisk.  We were able to finally bring the large fibroid uterus  through the incision.  It was at that point that we could appreciate that  the uterus was indeed 20-week size.  We then began to remove each fibroid by  injecting the serosal surface of the uterus  with Pitressin and saline,  incising the serosal surface, dissecting the fibroid from the surrounding  myometrium, and then completely removing each fibroid.  Bleeding was brisk  as we proceeded.  Hemostasis was achieved using a combination of figure-of-  eight sutures of 0 Vicryl and 3-0 Vicryl and pressure in the incisions.  Care was taken not to damage the fallopian tubes.  We were able to remove  approximately 15 fibroids and the estimated weight was 2784 g.  We then  closed the deep defects in the myometrium using figure-of-eight sutures.  The serosal surface of the uterus was closed over each defect using 3-0  Vicryl.  At the end of our procedure, hemostasis was noted to be adequate.  There was deep penetration into the uterus and therefore, a cesarean section  is recommended when we are ready to deliver her children in the future.  The  cause of the extensive blood loss and because there were episodes where the  patient's urine output was decreased and there was a question of a drop in  blood pressure.  We  elected to transfuse the patient two units of packed red  cells.  The family was notified prior to the transfusion.  Risks and  benefits of blood transfusions were reviewed with the family.  Their consent  was given.  The pelvis was vigorously irrigated.  Intercede was placed on  each incision.  The anterior peritoneum was closed using a running suture of  3-0 Vicryl.  The fascia was closed using a running suture of 0 Vicryl  followed by three interrupted sutures of 0 Vicryl.  A Jackson-Pratt drain  was placed in the subcutaneous layer and brought out through the right lower  quadrant.  It was sutured into place using 3-0 silk.  The subcutaneous layer  was closed using 0 Vicryl.  The skin was reapproximated using skin staples.  Sponge, needle and instrument counts were correct on two occasions.  The  estimated blood loss for the procedure was 2500 mL.  The patient received  5100 mL of IV fluid.  She was noted to have 200 mL of clear urine output.  Her blood pressure and her pulse were stable when the patient was taken to  the recovery room.  The patient tolerated the procedure well.  Pictures were  taken of the uterus before all of the fibroids were removed and pictures  were taken of the fibroids as well.                                               Janine Limbo, M.D.    AVS/MEDQ  D:  08/22/2003  T:  08/22/2003  Job:  [id number]

## 2010-09-11 NOTE — Discharge Summary (Signed)
NAME:  Felicia Monroe, Felicia Monroe                        ACCOUNT NO.:  [id number]   MEDICAL RECORD NO.:  [id number]                   PATIENT TYPE:  INP   LOCATION:  9310                                 [facility]   PHYSICIAN:  Janine Limbo, M.D.            DATE OF BIRTH:  1970/08/28   DATE OF ADMISSION:  08/22/2003  DATE OF DISCHARGE:  08/25/2003                                 DISCHARGE SUMMARY   DISCHARGE DIAGNOSIS:  Symptomatic uterine fibroids and menorrhagia.   OPERATION:  On the date of admission the patient underwent an abdominal  myomectomy with placement of a JP drain, tolerating all procedures well.  The patient was also transfused intraoperatively 2 units of packed red blood  cells.  The patient was found to have a fibroid uterus measuring 18- to 20-  week size with normal-appearing tubes and ovaries bilaterally.  The  aggregate weight of the patient's fibroids upon removal was 2739 g.   HISTORY OF PRESENT ILLNESS:  Felicia Monroe is a 40 year old married African-  American female gravida 0 who presents for abdominal myomectomy because of  symptomatic uterine fibroids.  Please see the patient's dictated History and  Physical Examination for details.   PREOPERATIVE PHYSICAL EXAMINATION:  VITAL SIGNS:  The weight is 267 pounds.  GENERAL:  Within normal limits.  PELVIC:  External genitalia is normal, vagina is normal, cervix is  nontender, uterus is approximately 14 weeks size and irregular.  Adnexa  without masses.  Rectovaginal exam confirms.   HOSPITAL COURSE:  On the date of admission the patient underwent  aforementioned procedures, tolerating them all well.  Postoperative course  was marked by the patient spiking a temperature of 101.8.  However, she  quickly defervesced and was afebrile for 24 hours prior to discharge.  The  patient also had a postoperative hemoglobin (immediately postoperative) of  8.7, postoperative day #1 7.1, and at the time of discharge of 6.6.   The  patient's preoperative hemoglobin was 11.8.  A discussion was held with the  patient regarding further transfusion.  However, due to her being  asymptomatic with this hemoglobin she declined.  By postoperative day #3 the  patient had resumed bowel and bladder function and was therefore deemed  ready for discharge home.   DISCHARGE MEDICATIONS:  1. Iron 325 mg one tablet twice daily for 6 weeks.  2. Colace 100 mg one tablet twice daily until bowel movements are regular.  3. Phenergan 25 mg one tablet q.6h. for nausea.  4. Ibuprofen 600 mg one tablet with food q.6h. for 5 days then as needed for     pain.  5. Vicodin one to two tablets q.4-6h. as needed for pain   FOLLOW-UP:  The patient is scheduled for a staple removal at [facility] on July 29, 2003 at 2 p.m.  She is scheduled for a 6 weeks  postoperative visit with Dr. Stefano Gaul on October 03, 2003 at 2:30 p.m.   DISCHARGE INSTRUCTIONS:  1. The patient was given a copy of [facility] postoperative     instruction sheet.  2. She was further advised to avoid driving for 2 weeks, heavy lifting for 4     weeks, and intercourse for 6 weeks.  3. The patient's diet was without restriction.   FINAL PATHOLOGY:  Myomectomy:  Leiomyomata.     Felicia Monroe.                    Janine Limbo, M.D.    EJP/MEDQ  D:  09/04/2003  T:  09/04/2003  Job:  [id number]

## 2010-09-11 NOTE — H&P (Signed)
NAME:  Felicia Monroe, GARRETSON NO.:  [id number]   MEDICAL RECORD NO.:  [id number]          PATIENT TYPE:  INP   LOCATION:  NA                            [facility]   PHYSICIAN:  Janine Limbo, M.D.DATE OF BIRTH:  [birth year]   DATE OF ADMISSION:  01/25/2005  DATE OF DISCHARGE:                                HISTORY & PHYSICAL   HISTORY OF PRESENT ILLNESS:  [name] is a 40 year old female, gravida 1,  para 0, who presents at [redacted] weeks gestation [facility] February 02, 2005) for  cesarean section. She has been followed at the [facility]  and Gynecology division of [facility].  This pregnancy  has been complicated by the fact that her age is greater than 35.  The  patient declined amniocentesis and quad screen.  The patient has had a prior  myomectomy with deep penetration into the uterus.  She desires primary  cesarean delivery.  She also is obese (weight 268 pounds).  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   PAST MEDICAL HISTORY:  Please see history of present illness.  The patient  denies hypertension and diabetes.   SOCIAL HISTORY:  The patient denies alcohol use, cigarette use, and  recreational drug use.   REVIEW OF SYSTEMS:  Normal pregnancy complaints.   FAMILY HISTORY:  Noncontributory.   PHYSICAL EXAMINATION:  VITAL SIGNS:  Weight 268 pounds.  HEENT: Within normal limits.  CHEST:  Clear.  HEART:  Regular rate and rhythm.  BREASTS: Without masses.  ABDOMEN:  Gravid, with a fundal height of 40 cm.  EXTREMITIES:  Grossly normal.  NEUROLOGIC:  Grossly normal.  PELVIC:  Cervix was closed and long when last checked.   LABORATORY DATA:  Blood type is B positive, antibody screen negative.  VDRL  nonreactive.  Rubella immune.  HBSAG negative.  HIV negative.  Third  trimester beta strep is negative.  Third trimester GC negative.  Third  trimester Chlamydia is negative.   ASSESSMENT:  1.  Thirty-nine weeks gestation.  2.  Prior myomectomy with deep penetration.  3.  Obesity.  4.  Advanced maternal age.   PLAN:  The patient will undergo a primary cesarean delivery.  She  understands the indications for her surgical procedure and she accepts the  risks of, but not the limited to, anesthetic complications, bleeding,  infection, and possible damage to the surrounding organs.      Janine Limbo, M.D.  Electronically Signed     AVS/MEDQ  D:  01/21/2005  T:  01/21/2005  Job:  [id number]

## 2010-09-11 NOTE — H&P (Signed)
NAME:  Felicia Monroe, Felicia Monroe                        ACCOUNT NO.:  [id number]   MEDICAL RECORD NO.:  [id number]                   PATIENT TYPE:  INP   LOCATION:  NA                                   [facility]   PHYSICIAN:  Janine Limbo, M.D.            DATE OF BIRTH:  09/29/1970   DATE OF ADMISSION:  08/22/2003  DATE OF DISCHARGE:                                HISTORY & PHYSICAL   HISTORY OF PRESENT ILLNESS:  Felicia Monroe is a 40 year old female, gravida 0,  who presents for an abdominal myomectomy. The patient has a history of  fibroids.  Her most recent ultrasound showed a 17 cm x 12.4 cm uterus with  multiple fibroids.  The largest fibroid measured 8.3 cm in size.  The  patient did have a sonohysterogram performed that showed that the  endometrial cavity was irregular.  The patient has a past history of  irregular cycles.  The patient has had an abnormal Pap smear in the past.  She has a history of the human papilloma virus.  Her most recent Pap smear  was in November of 2004 and it was within normal limits.   PAST MEDICAL HISTORY:  The patient has a history of hyperprolactinemia.  She  has been evaluated by a neurosurgeon and she does not have a pituitary  microadenoma.  She is doing well. She has had galactorrhea in the past.   SOCIAL HISTORY:  The patient is married and she currently works as a [employer] officer. She drinks alcohol socially. She denies cigarette use and  recreational drug use.   ALLERGIES:  No known drug allergies.   REVIEW OF SYSTEMS:  The patient has occasional hot flashes.   FAMILY HISTORY:  The patient's father had hypertension.  Her father also had  colon cancer.   PHYSICAL EXAMINATION:  GENERAL:  Weight is 267 pounds.  HEENT:  Within normal limits.  CHEST:  Clear.  HEART:  Regular rate and rhythm.  BREASTS:  Without masses.  ABDOMEN:  Nontender.  EXTREMITIES:  Grossly normal.  NEUROLOGIC:  Gross normal.  PELVIC:  External genitalia is normal,  vagina is normal.  Cervix is  nontender. The uterus is 14 week size and irregular.  Adnexa no masses.  Rectovaginal exam confirms.   PLAN:  The patient will undergo an abdominal myomectomy. She understands the  indications for her procedure and she accepts the risks of, but not limited  to, anesthetic complications, bleeding, infections and possible damage to  the surrounding organs.                                               Janine Limbo, M.D.   AVS/MEDQ  D:  08/20/2003  T:  08/20/2003  Job:  [id number]

## 2010-09-11 NOTE — H&P (Signed)
NAME:  Felicia Monroe, Felicia Monroe NO.:  [id number]   MEDICAL RECORD NO.:  [id number]          PATIENT TYPE:  INP   LOCATION:  9199                          FACILITY:  [facility]   PHYSICIAN:  Janine Limbo, M.D.DATE OF BIRTH:  1970-10-18   DATE OF ADMISSION:  01/25/2005  DATE OF DISCHARGE:                                HISTORY & PHYSICAL   HISTORY OF PRESENT ILLNESS:  Felicia Monroe is a 40 year old married African-  American female gravida 1 para 0 admitted at 43 weeks 6 days gestation for  primary low transverse cesarean section secondary to history of previous  uterine surgery in 2005 for a myomectomy. The patient's pregnancy remarkable  for:  1.  Advanced maternal age.  2.  Questionable LMP.  3.  History of myomectomy.  4.  History of infertility.  5.  History of polydactyly.   The patient denies any uterine contractions, leakage of fluid, bleeding. The  patient reports that her fetus is moving normally.   PRENATAL LABORATORY DATA:  Hemoglobin 11.1, hematocrit 32.6, platelets 317  at initial prenatal visit. Blood type B positive, antibody screen negative.  RPR nonreactive. Rubella immune. Hepatitis B surface antigen negative. HIV  negative. Pap smear December 2005 within normal limits. Gonorrhea and  chlamydia February 2006 negative. Urine culture negative. Glucola at 23  weeks 124. RPR nonreactive. Hemoglobin 10.5 at 23 weeks. Glucola repeat at  26 weeks 134. Group B strep is negative.   HISTORY OF PRESENT PREGNANCY:  The patient entered care at [facility] at [redacted] weeks gestation. The patient followed by M.D. service at [facility]. The patient declined amniocentesis and quadruple screen  secondary to maternal age of 33 years ago. The patient with constipation in  the early second trimester which resolved. The patient underwent ultrasound  at [facility] [place] at [redacted] weeks gestation revealing a single  intrauterine pregnancy, normal  fluid, normal placenta, cervix 4.5 cm, and  normal anatomy with Down's risk of 1 in 216 based on ultrasound. The patient  underwent Glucola at 23 weeks which returned with a value of 124, hemoglobin  10.5, RPR was negative. The patient also underwent repeat ultrasound at 26  weeks secondary to size greater than dates revealing normal fluid, cervix  4.7 cm, vertex presentation, and EFW at 90th percentile. The patient  underwent repeat Glucola at 26 weeks with a value of 134 and hemoglobin of  10.4. The remainder of the patient's pregnancy has been unremarkable.   OBSTETRICAL HISTORY:  The patient is a G1 P0.   PAST GYNECOLOGICAL HISTORY:  The patient reports menarche at age 8 with  menses every 28 days. The patient with a history of myomectomy in 2005  secondary to fibroids. The patient also with a history of infertility. The  patient used oral contraceptive pills in the past. The patient reports  history of abnormal Pap in college. However, follow-ups have been normal.  The patient with a history of chlamydia, Trichomonas, and condyloma in the  past, none currently.   PAST MEDICAL HISTORY:  Varicose veins, blood transfusion secondary to  myomectomy in 2005, uterine fibroids, and history of pyelonephritis x1  treated as an outpatient.   PAST SURGICAL HISTORY:  Myomectomy, April 2005; otherwise negative.   ALLERGIES:  No known drug allergies.   MEDICATIONS:  Prenatal vitamins daily; otherwise, negative.   FAMILY HISTORY:  A maternal grandmother with coronary artery disease and  diabetes. Mother:  Hypertension, diabetes.  Father:  Hypertension, varicose  veins, colon cancer. Brother:  Hypertension. Sister:  Anemia and  polydactyly.   GENETIC HISTORY:  The patient with a history of polydactyly with one extra  finger on each hand, as well as her sister with polydactyly as well. Father-  of-the-baby's sister is a twin. Maternal aunt with a hole in her heart.   SOCIAL HISTORY:  The  patient is a Emergency planning/management officer. The patient is married to  [place], who is involved and supportive. The patient denies use of alcohol,  tobacco, or street drugs throughout the pregnancy; however, she did have one  serving of alcohol prior to knowing that she was pregnant. The patient's  domestic violence screen is negative.   REVIEW OF SYSTEMS:  Negative.   PHYSICAL EXAMINATION:  VITAL SIGNS:  The patient is afebrile. Vital signs  are stable.  HEENT:  Within normal limits.  LUNGS:  Clear.  HEART:  Regular rate and rhythm without murmur, rub, or gallop.  BREASTS:  Soft.  ABDOMEN:  Soft and nontender, gravid in contour. Uterine fundus is soft and  nontender and fundal height consistent with 38+ weeks gestation. Post fetal  heart tones with Doppler.  PELVIC:  Cervical exam and pelvic exam are declined.  EXTREMITIES:  No edema present and Felicia Monroe' sign is negative bilaterally.   ASSESSMENT:  1.  Intrauterine pregnancy at 81 and six-sevenths weeks.  2.  History of previous uterine surgery in 2005.   PLAN:  1.  The patient admitted to [facility] [place].  2.  The patient is to undergo primary low transverse cesarean section      secondary to history of previous uterine surgery.   The patient is in agreement with this plan.     ______________________________  Rhona Leavens, CNM      Janine Limbo, M.D.  Electronically Signed    NOS/MEDQ  D:  01/25/2005  T:  01/25/2005  Job:  [id number]

## 2010-09-11 NOTE — Op Note (Signed)
NAME:  Felicia Monroe, Felicia Monroe NO.:  [id number]   MEDICAL RECORD NO.:  [id number]          PATIENT TYPE:  INP   LOCATION:  9125                          [facility]:  [facility]   PHYSICIAN:  Janine Limbo, M.D.DATE OF BIRTH:  04-14-1971   DATE OF PROCEDURE:  01/25/2005  DATE OF DISCHARGE:                                 OPERATIVE REPORT   PREOPERATIVE DIAGNOSES:  1.  Term intrauterine pregnancy.  2.  Prior myomectomy.  3.  Fibroid uterus.   POSTOPERATIVE DIAGNOSES:  1.  Term intrauterine pregnancy.  2.  Prior myomectomy.  3.  Fibroid uterus.  4.  Pelvic adhesions   PROCEDURE:  Primary low transverse cesarean section.   SURGEON:  Janine Limbo, M.D.   FIRST ASSISTANT:  Rhona Leavens, C.N.M.   ANESTHETIC:  Spinal.   DISPOSITION:  Felicia Monroe is a 40 year old female, gravida 1, para 0, who  presents at 62 weeks' gestation.  The patient has had a prior myomectomy  with deep penetration into the uterine muscle.  She understands the  indications for her cesarean section and she accepts the risk of, but not  limited to, anesthetic complications, bleeding, infections, and possible  damage to surrounding organs.   FINDINGS:  A 7 pound 8 ounce female infant Selena Batten) was delivered.  The Apgars  were 8 at one minute and 9 at five minutes.  There were small fibroids  present measuring less than 2 cm in size.  The fallopian tubes and the  ovaries appeared normal.  There were some moderate adhesions between the  omentum and the left adnexa.   PROCEDURE:  The the patient was taken to the operating room, where a spinal  anesthetic was given.  The patient's abdomen and perineum were prepped with  multiple layers of Betadine.  A Foley catheter was placed in the bladder.  The patient was sterilely draped.  The lower abdomen was injected with 10 mL  of 0.5% Marcaine with epinephrine.  A low transverse incision was made and  carried sharply through the subcutaneous tissue, the  fascia, and the  anterior peritoneum.  There was scarring present in the layers and it was  quite difficult to enter the peritoneal cavity.  An incision was made in the  lower uterine segment and brisk bleeding was encountered from varicosities.  The incision was extended in a low transverse fashion.  The amniotic cavity  was entered..  The fluid was noted to be clear.  The fetal head was  delivered without difficulty.  One nuchal cord was present.  The nuchal cord  was reduced and the mouth and nose were suctioned using bulb suction.  The  remainder of the infant was then delivered.  The cord was clamped and cut and the infant was handed to the awaiting  pediatric team.  Routine cord blood studies were obtained.  The placenta was  removed.  The uterine cavity was cleaned of amniotic fluid, clotted blood,  and membranes.  The uterine incision was closed using a running locking  suture of 2-0 Vicryl followed by an imbricating suture of 2-0  Vicryl.  Figure-of-eight sutures of 2-0 Vicryl were used for hemostasis.  Hemostasis  was noted be adequate throughout.  The pelvis was vigorously irrigated.  The  anterior peritoneum and the abdominal musculature were reapproximated in the  midline using 2-0 Vicryl.  The fascia and the subcutaneous layer were  irrigated.  Again hemostasis was adequate.  The fascia was closed using a  running suture of 0 Vicryl followed by three interrupted sutures of 0  Vicryl.  A Jackson-Pratt drain was placed in the subcutaneous space and  brought out through the left lower quadrant.  It was sutured into place  using 3-0 silk.  The subcutaneous layer was closed using a running suture of  0 Vicryl.  The skin was reapproximated using a subcuticular suture of 3-0  Monocryl.  Sponge, needle, and instrument counts were correct on two  occasions.  The estimated blood loss for the procedure was 1400 mL.  The  patient tolerated her procedure well.  She was taken to the recovery  room in  stable condition.  The infant was taken to the full-term nursery in stable  condition.      Janine Limbo, M.D.  Electronically Signed     AVS/MEDQ  D:  01/25/2005  T:  01/25/2005  Job:  [id number]

## 2010-09-23 ENCOUNTER — Encounter: Payer: Self-pay | Admitting: *Deleted

## 2010-09-23 ENCOUNTER — Telehealth: Payer: Self-pay

## 2010-09-23 NOTE — Telephone Encounter (Signed)
She is okay to do exercises as long as she does not hurt her back. If she has severe back pain, she needs to see orthopedics

## 2010-09-23 NOTE — Telephone Encounter (Signed)
Left detailed message for pt that letter was ready for pick up.

## 2010-09-23 NOTE — Telephone Encounter (Signed)
Pt is getting ready to join exercise program but needs note from Dr.

## 2011-02-11 LAB — OB RESULTS CONSOLE HIV ANTIBODY (ROUTINE TESTING): HIV: NONREACTIVE

## 2011-02-11 LAB — OB RESULTS CONSOLE ABO/RH: RH Type: POSITIVE

## 2011-02-11 LAB — OB RESULTS CONSOLE RPR: RPR: NONREACTIVE

## 2011-02-11 LAB — OB RESULTS CONSOLE GC/CHLAMYDIA
Chlamydia: NEGATIVE
Gonorrhea: NEGATIVE

## 2011-03-28 ENCOUNTER — Emergency Department
Admission: EM | Admit: 2011-03-28 | Discharge: 2011-03-28 | Disposition: A | Discharge status: Nursing Home (Includes ICF) | Payer: BC Managed Care – PPO | Source: Home / Self Care

## 2011-03-28 ENCOUNTER — Emergency Department (HOSPITAL_BASED_OUTPATIENT_CLINIC_OR_DEPARTMENT_OTHER)
Admission: EM | Admit: 2011-03-28 | Discharge: 2011-03-28 | Disposition: A | Discharge status: Home / Self Care | Payer: BC Managed Care – PPO

## 2011-03-30 ENCOUNTER — Emergency Department (INDEPENDENT_AMBULATORY_CARE_PROVIDER_SITE_OTHER)
Admission: EM | Admit: 2011-03-30 | Discharge: 2011-03-30 | Disposition: A | Discharge status: Home / Self Care | Payer: BC Managed Care – PPO | Source: Home / Self Care | Attending: Family Medicine | Admitting: Family Medicine

## 2011-03-30 ENCOUNTER — Encounter: Payer: Self-pay | Admitting: *Deleted

## 2011-03-30 DIAGNOSIS — J069 Acute upper respiratory infection, unspecified: Secondary | ICD-10-CM

## 2011-03-30 LAB — POCT CBC W AUTO DIFF (K'VILLE URGENT CARE)

## 2011-03-30 NOTE — ED Notes (Signed)
Patient c/o dry cough x 1 week. Denies fever. Vomited once last night. Taken sudafed and delsym OTC.

## 2011-03-30 NOTE — ED Provider Notes (Signed)
History     CSN: [id number] Arrival date & time: 03/30/2011  8:57 AM   First MD Initiated Contact with Patient 03/30/11 0920      Chief Complaint  Patient presents with  . Cough    (Consider location/radiation/quality/duration/timing/severity/associated sxs/prior treatment) HPI Comments: Patient complains of approximately 7 day history of gradually progressive URI symptoms beginning with progressive nasal congestion but no sore throat.  A cough started about 5 days ago.  Complains of fatigue but no myalgias.  Cough is now worse at night and generally non-productive during the day.  There has been no pleuritic pain, shortness of breath, or wheezes.  She is now about 15+ weeks pregnant.  Her husband has been diagnosed with the flu.  She has not had a flu shot  Patient is a 40 y.o. female presenting with cough.  Cough    Past Medical History  Diagnosis Date  . Gestational age less than 24 weeks     15weeks    Past Surgical History  Procedure Date  . Cesarean section   . Cesarean section   . Uterine fibroid surgery     Family History  Problem Relation Age of Onset  . Cancer Father 52    colon  . Colon polyps Brother     x 2  . Colon polyps Sister   . Coronary artery disease      aunt  . Diabetes Mother   . Diabetes Sister   . Hypertension Mother   . Hypertension Father   . Hypertension Brother     History  Substance Use Topics  . Smoking status: Never Smoker   . Smokeless tobacco: Not on file  . Alcohol Use: No    OB History    Grav Para Term Preterm Abortions TAB SAB Ect Mult Living   1               Review of Systems  Respiratory: Positive for cough.   No sore throat + cough No pleuritic pain No wheezing + nasal congestion No post-nasal drainage No sinus pain/pressure No itchy/red eyes No earache No hemoptysis No SOB No fever/chills No nausea No vomiting No abdominal pain No diarrhea No urinary symptoms No skin rashes + fatigue No  myalgias + mild headache Used OTC meds without relief  Allergies  Review of patient's allergies indicates no known allergies.  Home Medications   Current Outpatient Rx  Name Route Sig Dispense Refill  . CYCLOBENZAPRINE HCL 5 MG PO TABS Oral Take 5 mg by mouth 3 (three) times daily as needed.      Marland Kitchen LORATADINE 10 MG PO TABS Oral Take 10 mg by mouth daily.      . MELOXICAM 7.5 MG PO TABS Oral Take 7.5 mg by mouth 2 (two) times daily as needed.      Marland Kitchen PRENATAL VITAMIN PO Oral Take by mouth.        BP 124/83  Pulse 107  Temp(Src) 98.8 F (37.1 C) (Oral)  Resp 18  Ht 5\' 5"  (1.651 m)  Wt 282 lb 12.8 oz (128.277 kg)  BMI 47.06 kg/m2  SpO2 98%  Physical Exam Nursing notes and Vital Signs reviewed. Appearance:  Patient appears healthy, stated age, and in no acute distress.  She is morbidly obese (BMI 47.2) Eyes:  Pupils are equal, round, and reactive to light and accomodation.  Extraocular movement is intact.  Conjunctivae are not inflamed  Ears:  Canals normal.  Tympanic membranes normal.  Nose:  Mildly congested turbinates.  No sinus tenderness.   Pharynx:  Normal  Neck:  Supple.  No adenopathy is present.  Lungs:  Clear to auscultation.  Breath sounds are equal.  Heart:  Regular rate and rhythm without murmurs, rubs, or gallops.  Abdomen:  Gravid, nontender without masses or hepatosplenomegaly.  Bowel sounds are present.  No CVA or flank tenderness.  Extremities:  No edema.   Skin:  No rash present.  ED Course  Procedures  none   Labs Reviewed  POCT CBC W AUTO DIFF ([facility]) CBC:  WBC 8.0; LY 21.5; MO 3.2; GR 75.3; Hgb 11/3       1. Acute upper respiratory infections of unspecified site       MDM  There is no evidence of bacterial infection today.   Treat symptomatically for now:  Rest, increased fluids. follow-up with OB        Donna Christen, MD 03/31/11 2310

## 2011-04-27 HISTORY — DX: Maternal care for unspecified type scar from previous cesarean delivery: O34.219

## 2011-06-14 ENCOUNTER — Other Ambulatory Visit: Payer: Self-pay | Admitting: Obstetrics and Gynecology

## 2011-06-15 ENCOUNTER — Other Ambulatory Visit: Payer: Self-pay | Admitting: Obstetrics and Gynecology

## 2011-06-17 ENCOUNTER — Other Ambulatory Visit: Payer: Self-pay | Admitting: Obstetrics and Gynecology

## 2011-06-21 ENCOUNTER — Other Ambulatory Visit: Payer: Self-pay | Admitting: Obstetrics and Gynecology

## 2011-06-29 ENCOUNTER — Other Ambulatory Visit: Payer: Managed Care, Other (non HMO)

## 2011-06-29 ENCOUNTER — Encounter: Payer: Managed Care, Other (non HMO) | Admitting: Obstetrics and Gynecology

## 2011-06-29 ENCOUNTER — Other Ambulatory Visit (INDEPENDENT_AMBULATORY_CARE_PROVIDER_SITE_OTHER): Payer: Managed Care, Other (non HMO)

## 2011-06-29 DIAGNOSIS — Z331 Pregnant state, incidental: Secondary | ICD-10-CM

## 2011-06-29 DIAGNOSIS — O09529 Supervision of elderly multigravida, unspecified trimester: Secondary | ICD-10-CM

## 2011-07-15 ENCOUNTER — Encounter (INDEPENDENT_AMBULATORY_CARE_PROVIDER_SITE_OTHER): Payer: Managed Care, Other (non HMO) | Admitting: Obstetrics and Gynecology

## 2011-07-15 DIAGNOSIS — Z3480 Encounter for supervision of other normal pregnancy, unspecified trimester: Secondary | ICD-10-CM

## 2011-07-26 DIAGNOSIS — E669 Obesity, unspecified: Secondary | ICD-10-CM

## 2011-07-26 HISTORY — DX: Morbid (severe) obesity due to excess calories: E66.01

## 2011-08-02 ENCOUNTER — Encounter (INDEPENDENT_AMBULATORY_CARE_PROVIDER_SITE_OTHER): Payer: Managed Care, Other (non HMO) | Admitting: Obstetrics and Gynecology

## 2011-08-02 DIAGNOSIS — Z331 Pregnant state, incidental: Secondary | ICD-10-CM

## 2011-08-11 ENCOUNTER — Ambulatory Visit (INDEPENDENT_AMBULATORY_CARE_PROVIDER_SITE_OTHER): Payer: Managed Care, Other (non HMO) | Admitting: Obstetrics and Gynecology

## 2011-08-11 ENCOUNTER — Encounter: Payer: Self-pay | Admitting: Obstetrics and Gynecology

## 2011-08-11 VITALS — BP 116/72 | Wt 278.0 lb

## 2011-08-11 DIAGNOSIS — Z331 Pregnant state, incidental: Secondary | ICD-10-CM

## 2011-08-11 DIAGNOSIS — Z9889 Other specified postprocedural states: Secondary | ICD-10-CM

## 2011-08-11 DIAGNOSIS — O34219 Maternal care for unspecified type scar from previous cesarean delivery: Secondary | ICD-10-CM | POA: Insufficient documentation

## 2011-08-11 HISTORY — DX: Other specified postprocedural states: Z98.890

## 2011-08-11 NOTE — Progress Notes (Signed)
Pt. Stated no issues today.  

## 2011-08-11 NOTE — Progress Notes (Signed)
No complaints 1 hour Glucola 07/15/11=147 Seen by Dr Stefano Gaul 08/02/11: recommended 3hr GTT was not scheduled Instructed with loading diet

## 2011-08-16 ENCOUNTER — Other Ambulatory Visit: Payer: Self-pay | Admitting: Obstetrics and Gynecology

## 2011-08-17 ENCOUNTER — Telehealth: Payer: Self-pay | Admitting: Obstetrics and Gynecology

## 2011-08-17 LAB — GLUCOSE TOLERANCE, 3 HOURS
Glucose Tolerance, 1 hour: 124 mg/dL (ref 70–189)
Glucose Tolerance, 2 hour: 122 mg/dL (ref 70–164)
Glucose Tolerance, Fasting: 86 mg/dL (ref 70–104)
Glucose, GTT - 3 Hour: 105 mg/dL (ref 70–144)

## 2011-08-17 NOTE — Telephone Encounter (Signed)
Routed to triage 

## 2011-08-17 NOTE — Telephone Encounter (Signed)
TC from pt.  States x2 nights had vag pressure and lower abd pain. Is better today.  Also felt constipated.  +FM.   Irreg cont. No vag leakage. Had normal BM last PM.  Not drinking sufficient water.   Advised rest, increase water to 8-10 glasses /day, warm bath, Tylenol.   To call if no improvement or other concerns. Pt verbalizes comprehension.

## 2011-08-19 ENCOUNTER — Ambulatory Visit (INDEPENDENT_AMBULATORY_CARE_PROVIDER_SITE_OTHER): Payer: Managed Care, Other (non HMO) | Admitting: Obstetrics and Gynecology

## 2011-08-19 VITALS — BP 118/64 | Wt 273.0 lb

## 2011-08-19 DIAGNOSIS — Z331 Pregnant state, incidental: Secondary | ICD-10-CM

## 2011-08-19 DIAGNOSIS — O34219 Maternal care for unspecified type scar from previous cesarean delivery: Secondary | ICD-10-CM

## 2011-08-19 DIAGNOSIS — M549 Dorsalgia, unspecified: Secondary | ICD-10-CM

## 2011-08-19 DIAGNOSIS — Z9889 Other specified postprocedural states: Secondary | ICD-10-CM

## 2011-08-19 NOTE — Progress Notes (Signed)
3hr GTT nl No complaints Checklist reviewed GBS done

## 2011-08-22 LAB — CULTURE, BETA STREP (GROUP B ONLY)

## 2011-08-23 ENCOUNTER — Encounter (HOSPITAL_COMMUNITY): Payer: Self-pay | Admitting: Pharmacist

## 2011-08-25 ENCOUNTER — Ambulatory Visit (INDEPENDENT_AMBULATORY_CARE_PROVIDER_SITE_OTHER): Payer: Managed Care, Other (non HMO) | Admitting: Obstetrics and Gynecology

## 2011-08-25 VITALS — BP 130/64 | Wt 280.0 lb

## 2011-08-25 DIAGNOSIS — Z331 Pregnant state, incidental: Secondary | ICD-10-CM

## 2011-08-25 NOTE — Progress Notes (Signed)
GBS done 08-19-11 was Negative. Pt states she has no concerns today.

## 2011-08-25 NOTE — Progress Notes (Signed)
Doing well.  Return to office in 1 week.  C-section in 2 weeks.  Dr. Stefano Gaul

## 2011-09-01 ENCOUNTER — Encounter (HOSPITAL_COMMUNITY)
Admission: RE | Admit: 2011-09-01 | Discharge: 2011-09-01 | Disposition: A | Discharge status: Home / Self Care | Payer: Managed Care, Other (non HMO) | Source: Ambulatory Visit | Attending: Obstetrics and Gynecology | Admitting: Obstetrics and Gynecology

## 2011-09-01 ENCOUNTER — Encounter (HOSPITAL_COMMUNITY): Payer: Self-pay

## 2011-09-01 VITALS — BP 146/88 | Ht 65.0 in | Wt 283.0 lb

## 2011-09-01 DIAGNOSIS — O34219 Maternal care for unspecified type scar from previous cesarean delivery: Secondary | ICD-10-CM

## 2011-09-01 HISTORY — DX: Encounter for other specified aftercare: Z51.89

## 2011-09-01 HISTORY — DX: Other specified pregnancy related conditions, unspecified trimester: O26.899

## 2011-09-01 HISTORY — DX: Anemia, unspecified: D64.9

## 2011-09-01 HISTORY — DX: Heartburn: R12

## 2011-09-01 LAB — CBC
Hemoglobin: 9 g/dL — ABNORMAL LOW (ref 12.0–15.0)
MCH: 28.1 pg (ref 26.0–34.0)
Platelets: 208 10*3/uL (ref 150–400)
RBC: 3.2 MIL/uL — ABNORMAL LOW (ref 3.87–5.11)
WBC: 10 10*3/uL (ref 4.0–10.5)

## 2011-09-01 LAB — RPR: RPR Ser Ql: NONREACTIVE

## 2011-09-01 NOTE — Patient Instructions (Addendum)
YOUR PROCEDURE IS SCHEDULED ON:09/10/11  ENTER THROUGH THE MAIN ENTRANCE OF [facility] AT:8am  USE DESK PHONE AND DIAL [phone] TO INFORM us OF YOUR ARRIVAL  CALL [phone] IF YOU HAVE ANY QUESTIONS OR PROBLEMS PRIOR TO YOUR ARRIVAL.  REMEMBER: DO NOT EAT AFTER MIDNIGHT : Thursday  SPECIAL INSTRUCTIONS:water ok until 5am Friday   YOU MAY BRUSH YOUR TEETH THE MORNING OF SURGERY   TAKE THESE MEDICINES THE DAY OF SURGERY WITH SIP OF WATER:none   DO NOT WEAR JEWELRY, EYE MAKEUP, LIPSTICK OR DARK FINGERNAIL POLISH DO NOT WEAR LOTIONS  DO NOT SHAVE FOR 48 HOURS PRIOR TO SURGERY  YOU WILL NOT BE ALLOWED TO DRIVE YOURSELF HOME.  NAME OF DRIVER: Josie Saunders

## 2011-09-02 ENCOUNTER — Encounter: Payer: Self-pay | Admitting: Obstetrics and Gynecology

## 2011-09-02 ENCOUNTER — Ambulatory Visit (INDEPENDENT_AMBULATORY_CARE_PROVIDER_SITE_OTHER): Payer: Managed Care, Other (non HMO) | Admitting: Obstetrics and Gynecology

## 2011-09-02 VITALS — BP 128/80 | Wt 280.0 lb

## 2011-09-02 DIAGNOSIS — Z331 Pregnant state, incidental: Secondary | ICD-10-CM

## 2011-09-02 NOTE — Progress Notes (Signed)
The patient complains of pressure. Ready for C-section and tubal ligation next week.  Dr. Stefano Gaul

## 2011-09-02 NOTE — Progress Notes (Signed)
Pt want cervix checked  Lm  Pt states that her bp was elevated in right arm at the hospital. Today it is better. lm

## 2011-09-09 MED ORDER — CEFAZOLIN SODIUM-DEXTROSE 2-3 GM-% IV SOLR
2.0000 g | INTRAVENOUS | Status: AC
  Administered 2011-09-10: 2 g via INTRAVENOUS
  Filled 2011-09-09: qty 50

## 2011-09-09 NOTE — H&P (Signed)
NAME:  Felicia Monroe, CRANDLE NO.:  [id number]  MEDICAL RECORD NO.:  [id number]  LOCATION:  PERIO                         [facility]ACILITY:  [facility]  PHYSICIAN:  Janine Limbo, M.D.DATE OF BIRTH:  11-Mar-1971  DATE OF ADMISSION:  09/10/2011 DATE OF DISCHARGE:                             HISTORY & PHYSICAL   HISTORY OF PRESENT ILLNESS:  Ms. Tep is a 41 year old female, gravida 2, para 1-0-0-1, who presents at [redacted] weeks gestation (EDC is Sep 16, 2011).  The patient has been followed at the [facility].  The patient presents for repeat cesarean section and bilateral tubal ligation.  She had a myomectomy performed in the past.  She has had a prior cesarean section.  She desires repeat cesarean section.  OBSTETRICAL HISTORY:  In October 2006, the patient had a cesarean section at term because of a prior myomectomy.  She was noted to have adhesions at that time.  DRUG ALLERGIES:  No known drug allergies.  PAST MEDICAL HISTORY:  Please see history of present illness.  SOCIAL HISTORY:  The patient denies alcohol use, cigarette use, and recreational drug use.  REVIEW OF SYSTEMS:  Normal pregnancy complaints.  FAMILY HISTORY:  Noncontributory.  PHYSICAL EXAMINATION:  VITAL SIGNS:  Weight is 280 pounds. HEENT:  Within normal limits. CHEST:  Clear. HEART:  Regular rate and rhythm. BREASTS:  Without masses. ABDOMEN:  Gravid. EXTREMITIES:  Grossly normal. NEUROLOGIC:  Grossly normal. PELVIC:  Cervix was closed and long.  LABORATORY VALUES:  Blood type is B positive.  Antibody screen negative, sickle cell negative, VDRL negative, rubella immune, HBsAg negative, HIV is negative.  Third trimester beta strep is negative.  Hemoglobin 9.0, hematocrit is 27.1%.  ASSESSMENT: 1. A [redacted] weeks gestation. 2. Prior myomectomy. 3. Prior cesarean section. 4. Desires repeat cesarean section and desires  sterilization. 5. Obesity. 6. Anemia.  PLAN:  The patient will undergo a repeat low transverse cesarean section and bilateral tubal sterilization procedure.  She understands the indications for her surgical procedure and she accepts the risks of, but not limited to, anesthetic complications, bleeding, infection, possible damage to surrounding organs, and possible tubal failure (17/1000).     Janine Limbo, M.D.     AVS/MEDQ  D:  09/09/2011  T:  09/09/2011  Job:  [id number]

## 2011-09-10 ENCOUNTER — Encounter (HOSPITAL_COMMUNITY): Payer: Self-pay | Admitting: *Deleted

## 2011-09-10 ENCOUNTER — Encounter (HOSPITAL_COMMUNITY): Payer: Self-pay | Admitting: Anesthesiology

## 2011-09-10 ENCOUNTER — Inpatient Hospital Stay (HOSPITAL_COMMUNITY)
Admission: RE | Admit: 2011-09-10 | Discharge: 2011-09-15 | DRG: 765 | Disposition: A | Discharge status: Home / Self Care | Payer: Managed Care, Other (non HMO) | Source: Ambulatory Visit | Attending: Obstetrics and Gynecology | Admitting: Obstetrics and Gynecology

## 2011-09-10 ENCOUNTER — Inpatient Hospital Stay (HOSPITAL_COMMUNITY): Payer: Managed Care, Other (non HMO) | Admitting: Anesthesiology

## 2011-09-10 ENCOUNTER — Encounter (HOSPITAL_COMMUNITY)
Admission: RE | Disposition: A | Discharge status: Home / Self Care | Payer: Self-pay | Source: Ambulatory Visit | Attending: Obstetrics and Gynecology

## 2011-09-10 DIAGNOSIS — Z01812 Encounter for preprocedural laboratory examination: Secondary | ICD-10-CM

## 2011-09-10 DIAGNOSIS — I10 Essential (primary) hypertension: Secondary | ICD-10-CM

## 2011-09-10 DIAGNOSIS — Z98891 History of uterine scar from previous surgery: Secondary | ICD-10-CM | POA: Diagnosis not present

## 2011-09-10 DIAGNOSIS — O34219 Maternal care for unspecified type scar from previous cesarean delivery: Principal | ICD-10-CM | POA: Diagnosis present

## 2011-09-10 DIAGNOSIS — O9902 Anemia complicating childbirth: Secondary | ICD-10-CM | POA: Diagnosis present

## 2011-09-10 DIAGNOSIS — O09529 Supervision of elderly multigravida, unspecified trimester: Secondary | ICD-10-CM | POA: Diagnosis present

## 2011-09-10 DIAGNOSIS — Z01818 Encounter for other preprocedural examination: Secondary | ICD-10-CM

## 2011-09-10 DIAGNOSIS — O135 Gestational [pregnancy-induced] hypertension without significant proteinuria, complicating the puerperium: Secondary | ICD-10-CM | POA: Diagnosis not present

## 2011-09-10 DIAGNOSIS — Z302 Encounter for sterilization: Secondary | ICD-10-CM

## 2011-09-10 DIAGNOSIS — D649 Anemia, unspecified: Secondary | ICD-10-CM | POA: Diagnosis present

## 2011-09-10 DIAGNOSIS — R34 Anuria and oliguria: Secondary | ICD-10-CM | POA: Diagnosis not present

## 2011-09-10 LAB — CBC
HCT: 19.3 % — ABNORMAL LOW (ref 36.0–46.0)
Hemoglobin: 6.5 g/dL — CL (ref 12.0–15.0)
MCHC: 33.7 g/dL (ref 30.0–36.0)
RBC: 2.29 MIL/uL — ABNORMAL LOW (ref 3.87–5.11)

## 2011-09-10 SURGERY — Surgical Case
Anesthesia: Spinal | Site: Abdomen | Wound class: Clean Contaminated

## 2011-09-10 MED ORDER — SIMETHICONE 80 MG PO CHEW
80.0000 mg | CHEWABLE_TABLET | Freq: Three times a day (TID) | ORAL | Status: DC
  Administered 2011-09-11 – 2011-09-15 (×10): 80 mg via ORAL

## 2011-09-10 MED ORDER — NALOXONE HCL 0.4 MG/ML IJ SOLN: 0.4000 mg | INTRAMUSCULAR | Status: DC | PRN

## 2011-09-10 MED ORDER — PHENYLEPHRINE HCL 10 MG/ML IJ SOLN
INTRAMUSCULAR | Status: DC | PRN
  Administered 2011-09-10 (×2): 40 ug via INTRAVENOUS
  Administered 2011-09-10 (×2): 80 ug via INTRAVENOUS

## 2011-09-10 MED ORDER — LACTATED RINGERS IV SOLN: INTRAVENOUS | Status: DC

## 2011-09-10 MED ORDER — TETANUS-DIPHTH-ACELL PERTUSSIS 5-2.5-18.5 LF-MCG/0.5 IM SUSP
0.5000 mL | Freq: Once | INTRAMUSCULAR | Status: AC
  Administered 2011-09-12: 0.5 mL via INTRAMUSCULAR
  Filled 2011-09-10: qty 0.5

## 2011-09-10 MED ORDER — SCOPOLAMINE 1 MG/3DAYS TD PT72
1.0000 | MEDICATED_PATCH | Freq: Once | TRANSDERMAL | Status: DC
  Administered 2011-09-10: 1.5 mg via TRANSDERMAL

## 2011-09-10 MED ORDER — MEPERIDINE HCL 25 MG/ML IJ SOLN: 6.2500 mg | INTRAMUSCULAR | Status: DC | PRN

## 2011-09-10 MED ORDER — METOCLOPRAMIDE HCL 5 MG/ML IJ SOLN
INTRAMUSCULAR | Status: DC | PRN
  Administered 2011-09-10: 10 mg via INTRAVENOUS

## 2011-09-10 MED ORDER — PANTOPRAZOLE SODIUM 40 MG PO TBEC
DELAYED_RELEASE_TABLET | ORAL | Status: AC
  Administered 2011-09-10: 40 mg via ORAL
  Filled 2011-09-10: qty 1

## 2011-09-10 MED ORDER — PSEUDOEPHEDRINE HCL 30 MG PO TABS: 30.0000 mg | ORAL_TABLET | Freq: Two times a day (BID) | ORAL | Status: DC | PRN

## 2011-09-10 MED ORDER — EPHEDRINE 5 MG/ML INJ
INTRAVENOUS | Status: AC
  Filled 2011-09-10: qty 10

## 2011-09-10 MED ORDER — MEDROXYPROGESTERONE ACETATE 150 MG/ML IM SUSP: 150.0000 mg | INTRAMUSCULAR | Status: DC | PRN

## 2011-09-10 MED ORDER — IBUPROFEN 600 MG PO TABS
600.0000 mg | ORAL_TABLET | Freq: Four times a day (QID) | ORAL | Status: DC
  Administered 2011-09-11 – 2011-09-15 (×17): 600 mg via ORAL
  Filled 2011-09-10 (×17): qty 1

## 2011-09-10 MED ORDER — SENNOSIDES-DOCUSATE SODIUM 8.6-50 MG PO TABS
2.0000 | ORAL_TABLET | Freq: Every day | ORAL | Status: DC
  Administered 2011-09-11: 2 via ORAL

## 2011-09-10 MED ORDER — PRENATAL MULTIVITAMIN CH
1.0000 | ORAL_TABLET | Freq: Every day | ORAL | Status: DC
  Administered 2011-09-11 – 2011-09-15 (×4): 1 via ORAL
  Filled 2011-09-10 (×5): qty 1

## 2011-09-10 MED ORDER — LACTATED RINGERS IV SOLN
Freq: Once | INTRAVENOUS | Status: AC
  Administered 2011-09-10 (×5): via INTRAVENOUS

## 2011-09-10 MED ORDER — ONDANSETRON HCL 4 MG/2ML IJ SOLN: 4.0000 mg | Freq: Three times a day (TID) | INTRAMUSCULAR | Status: DC | PRN

## 2011-09-10 MED ORDER — ONDANSETRON HCL 4 MG PO TABS: 4.0000 mg | ORAL_TABLET | ORAL | Status: DC | PRN

## 2011-09-10 MED ORDER — LACTATED RINGERS IV SOLN
INTRAVENOUS | Status: DC
  Administered 2011-09-10 – 2011-09-11 (×4): via INTRAVENOUS

## 2011-09-10 MED ORDER — DIPHENHYDRAMINE HCL 50 MG/ML IJ SOLN: 12.5000 mg | INTRAMUSCULAR | Status: DC | PRN

## 2011-09-10 MED ORDER — MEASLES, MUMPS & RUBELLA VAC ~~LOC~~ INJ: 0.5000 mL | INJECTION | Freq: Once | SUBCUTANEOUS | Status: DC

## 2011-09-10 MED ORDER — SODIUM CHLORIDE 0.9 % IV SOLN: 1.0000 ug/kg/h | INTRAVENOUS | Status: DC | PRN

## 2011-09-10 MED ORDER — DIPHENHYDRAMINE HCL 25 MG PO CAPS: 25.0000 mg | ORAL_CAPSULE | ORAL | Status: DC | PRN

## 2011-09-10 MED ORDER — MORPHINE SULFATE 0.5 MG/ML IJ SOLN
INTRAMUSCULAR | Status: AC
  Filled 2011-09-10: qty 10

## 2011-09-10 MED ORDER — METOCLOPRAMIDE HCL 5 MG/ML IJ SOLN
INTRAMUSCULAR | Status: AC
  Filled 2011-09-10: qty 2

## 2011-09-10 MED ORDER — IBUPROFEN 600 MG PO TABS: 600.0000 mg | ORAL_TABLET | Freq: Four times a day (QID) | ORAL | Status: DC | PRN

## 2011-09-10 MED ORDER — OXYTOCIN 10 UNIT/ML IJ SOLN
INTRAMUSCULAR | Status: AC
  Filled 2011-09-10: qty 4

## 2011-09-10 MED ORDER — OXYTOCIN 20 UNITS IN LACTATED RINGERS INFUSION - SIMPLE: 125.0000 mL/h | INTRAVENOUS | Status: AC

## 2011-09-10 MED ORDER — PHENYLEPHRINE 40 MCG/ML (10ML) SYRINGE FOR IV PUSH (FOR BLOOD PRESSURE SUPPORT)
PREFILLED_SYRINGE | INTRAVENOUS | Status: AC
  Filled 2011-09-10: qty 5

## 2011-09-10 MED ORDER — ZOLPIDEM TARTRATE 5 MG PO TABS: 5.0000 mg | ORAL_TABLET | Freq: Every evening | ORAL | Status: DC | PRN

## 2011-09-10 MED ORDER — KETOROLAC TROMETHAMINE 60 MG/2ML IM SOLN
60.0000 mg | Freq: Once | INTRAMUSCULAR | Status: AC | PRN
  Administered 2011-09-10: 60 mg via INTRAMUSCULAR

## 2011-09-10 MED ORDER — SODIUM CHLORIDE 0.9 % IJ SOLN: 3.0000 mL | INTRAMUSCULAR | Status: DC | PRN

## 2011-09-10 MED ORDER — KETOROLAC TROMETHAMINE 30 MG/ML IJ SOLN
30.0000 mg | Freq: Four times a day (QID) | INTRAMUSCULAR | Status: AC | PRN
  Administered 2011-09-10 – 2011-09-11 (×2): 30 mg via INTRAVENOUS
  Filled 2011-09-10 (×2): qty 1

## 2011-09-10 MED ORDER — OXYCODONE-ACETAMINOPHEN 5-325 MG PO TABS: 1.0000 | ORAL_TABLET | ORAL | Status: DC | PRN

## 2011-09-10 MED ORDER — LACTATED RINGERS IV BOLUS (SEPSIS)
500.0000 mL | Freq: Once | INTRAVENOUS | Status: AC
  Administered 2011-09-10: 500 mL via INTRAVENOUS

## 2011-09-10 MED ORDER — PANTOPRAZOLE SODIUM 40 MG PO TBEC
40.0000 mg | DELAYED_RELEASE_TABLET | Freq: Every day | ORAL | Status: DC
  Administered 2011-09-10: 40 mg via ORAL

## 2011-09-10 MED ORDER — BUPIVACAINE-EPINEPHRINE 0.5% -1:200000 IJ SOLN
INTRAMUSCULAR | Status: DC | PRN
  Administered 2011-09-10: 10 mL

## 2011-09-10 MED ORDER — KETOROLAC TROMETHAMINE 30 MG/ML IJ SOLN: 30.0000 mg | Freq: Four times a day (QID) | INTRAMUSCULAR | Status: AC | PRN

## 2011-09-10 MED ORDER — DIPHENHYDRAMINE HCL 25 MG PO CAPS: 25.0000 mg | ORAL_CAPSULE | Freq: Four times a day (QID) | ORAL | Status: DC | PRN

## 2011-09-10 MED ORDER — SCOPOLAMINE 1 MG/3DAYS TD PT72
MEDICATED_PATCH | TRANSDERMAL | Status: AC
  Administered 2011-09-10: 1.5 mg via TRANSDERMAL
  Filled 2011-09-10: qty 1

## 2011-09-10 MED ORDER — FENTANYL CITRATE 0.05 MG/ML IJ SOLN
INTRAMUSCULAR | Status: AC
  Filled 2011-09-10: qty 2

## 2011-09-10 MED ORDER — MENTHOL 3 MG MT LOZG: 1.0000 | LOZENGE | OROMUCOSAL | Status: DC | PRN

## 2011-09-10 MED ORDER — DIBUCAINE 1 % RE OINT: 1.0000 "application " | TOPICAL_OINTMENT | RECTAL | Status: DC | PRN

## 2011-09-10 MED ORDER — MORPHINE SULFATE (PF) 0.5 MG/ML IJ SOLN
INTRAMUSCULAR | Status: DC | PRN
  Administered 2011-09-10: .1 mg via INTRATHECAL

## 2011-09-10 MED ORDER — BUPIVACAINE-EPINEPHRINE (PF) 0.5% -1:200000 IJ SOLN
INTRAMUSCULAR | Status: AC
  Filled 2011-09-10: qty 10

## 2011-09-10 MED ORDER — DIPHENHYDRAMINE HCL 50 MG/ML IJ SOLN: 25.0000 mg | INTRAMUSCULAR | Status: DC | PRN

## 2011-09-10 MED ORDER — BUPIVACAINE IN DEXTROSE 0.75-8.25 % IT SOLN
INTRATHECAL | Status: DC | PRN
  Administered 2011-09-10: 12 mg via INTRATHECAL

## 2011-09-10 MED ORDER — NALBUPHINE HCL 10 MG/ML IJ SOLN
5.0000 mg | INTRAMUSCULAR | Status: DC | PRN
  Filled 2011-09-10: qty 1

## 2011-09-10 MED ORDER — METOCLOPRAMIDE HCL 5 MG/ML IJ SOLN: 10.0000 mg | Freq: Three times a day (TID) | INTRAMUSCULAR | Status: DC | PRN

## 2011-09-10 MED ORDER — EPHEDRINE SULFATE 50 MG/ML IJ SOLN
INTRAMUSCULAR | Status: DC | PRN
  Administered 2011-09-10: 10 mg via INTRAVENOUS
  Administered 2011-09-10 (×3): 5 mg via INTRAVENOUS
  Administered 2011-09-10: 15 mg via INTRAVENOUS
  Administered 2011-09-10: 10 mg via INTRAVENOUS

## 2011-09-10 MED ORDER — SCOPOLAMINE 1 MG/3DAYS TD PT72
1.0000 | MEDICATED_PATCH | Freq: Once | TRANSDERMAL | Status: DC
  Filled 2011-09-10: qty 1

## 2011-09-10 MED ORDER — NALBUPHINE HCL 10 MG/ML IJ SOLN
5.0000 mg | INTRAMUSCULAR | Status: DC | PRN
Start: 2011-09-10 — End: 2011-09-15
  Filled 2011-09-10: qty 1

## 2011-09-10 MED ORDER — SIMETHICONE 80 MG PO CHEW: 80.0000 mg | CHEWABLE_TABLET | ORAL | Status: DC | PRN

## 2011-09-10 MED ORDER — FENTANYL CITRATE 0.05 MG/ML IJ SOLN: 25.0000 ug | INTRAMUSCULAR | Status: DC | PRN

## 2011-09-10 MED ORDER — CITRIC ACID-SODIUM CITRATE 334-500 MG/5ML PO SOLN
ORAL | Status: AC
  Administered 2011-09-10: 30 mL via ORAL
  Filled 2011-09-10: qty 15

## 2011-09-10 MED ORDER — WITCH HAZEL-GLYCERIN EX PADS: 1.0000 "application " | MEDICATED_PAD | CUTANEOUS | Status: DC | PRN

## 2011-09-10 MED ORDER — ONDANSETRON HCL 4 MG/2ML IJ SOLN
4.0000 mg | INTRAMUSCULAR | Status: DC | PRN
  Administered 2011-09-10: 4 mg via INTRAVENOUS
  Filled 2011-09-10: qty 2

## 2011-09-10 MED ORDER — OXYTOCIN 20 UNITS IN LACTATED RINGERS INFUSION - SIMPLE
INTRAVENOUS | Status: DC | PRN
  Administered 2011-09-10 (×2): 20 [IU] via INTRAVENOUS

## 2011-09-10 MED ORDER — ONDANSETRON HCL 4 MG/2ML IJ SOLN
INTRAMUSCULAR | Status: AC
Start: 2011-09-10 — End: 2011-09-10
  Filled 2011-09-10: qty 2

## 2011-09-10 MED ORDER — FENTANYL CITRATE 0.05 MG/ML IJ SOLN
INTRAMUSCULAR | Status: DC | PRN
  Administered 2011-09-10: 25 ug via INTRATHECAL

## 2011-09-10 MED ORDER — CITRIC ACID-SODIUM CITRATE 334-500 MG/5ML PO SOLN
30.0000 mL | Freq: Once | ORAL | Status: AC
  Administered 2011-09-10: 30 mL via ORAL

## 2011-09-10 MED ORDER — LORATADINE 10 MG PO TABS
10.0000 mg | ORAL_TABLET | Freq: Every day | ORAL | Status: DC | PRN
  Filled 2011-09-10: qty 1

## 2011-09-10 MED ORDER — KETOROLAC TROMETHAMINE 60 MG/2ML IM SOLN
INTRAMUSCULAR | Status: AC
  Filled 2011-09-10: qty 2

## 2011-09-10 MED ORDER — LANOLIN HYDROUS EX OINT: 1.0000 "application " | TOPICAL_OINTMENT | CUTANEOUS | Status: DC | PRN

## 2011-09-10 SURGICAL SUPPLY — 41 items
CHLORAPREP W/TINT 26ML (MISCELLANEOUS) ×2 IMPLANT
CLOTH BEACON ORANGE TIMEOUT ST (SAFETY) ×2 IMPLANT
CONTAINER PREFILL 10% NBF 15ML (MISCELLANEOUS) IMPLANT
DRAIN JACKSON PRT FLT 7MM (DRAIN) ×1 IMPLANT
DRESSING TELFA 8X3 (GAUZE/BANDAGES/DRESSINGS) ×2 IMPLANT
ELECT REM PT RETURN 9FT ADLT (ELECTROSURGICAL) ×2
ELECTRODE REM PT RTRN 9FT ADLT (ELECTROSURGICAL) ×1 IMPLANT
EVACUATOR SILICONE 100CC (DRAIN) ×1 IMPLANT
EXTRACTOR VACUUM M CUP 4 TUBE (SUCTIONS) ×1 IMPLANT
GAUZE SPONGE 4X4 12PLY STRL LF (GAUZE/BANDAGES/DRESSINGS) ×4 IMPLANT
GLOVE BIOGEL PI IND STRL 8.5 (GLOVE) ×1 IMPLANT
GLOVE BIOGEL PI INDICATOR 8.5 (GLOVE) ×1
GLOVE ECLIPSE 8.0 STRL XLNG CF (GLOVE) ×4 IMPLANT
GOWN PREVENTION PLUS LG XLONG (DISPOSABLE) ×4 IMPLANT
GOWN PREVENTION PLUS XXLARGE (GOWN DISPOSABLE) ×2 IMPLANT
KIT ABG SYR 3ML LUER SLIP (SYRINGE) IMPLANT
NDL HYPO 25X1 1.5 SAFETY (NEEDLE) ×1 IMPLANT
NDL HYPO 25X5/8 SAFETYGLIDE (NEEDLE) IMPLANT
NEEDLE HYPO 25X1 1.5 SAFETY (NEEDLE) ×2 IMPLANT
NEEDLE HYPO 25X5/8 SAFETYGLIDE (NEEDLE) IMPLANT
PACK C SECTION WH (CUSTOM PROCEDURE TRAY) ×2 IMPLANT
PAD ABD 7.5X8 STRL (GAUZE/BANDAGES/DRESSINGS) ×2 IMPLANT
RINGERS IRRIG 1000ML POUR BTL (IV SOLUTION) ×2 IMPLANT
SLEEVE SCD COMPRESS KNEE MED (MISCELLANEOUS) IMPLANT
SPONGE GAUZE 4X4 12PLY (GAUZE/BANDAGES/DRESSINGS) ×1 IMPLANT
STAPLER VISISTAT 35W (STAPLE) IMPLANT
SUT MNCRL AB 3-0 PS2 27 (SUTURE) IMPLANT
SUT PLAIN 0 NONE (SUTURE) IMPLANT
SUT SILK 3 0 FS 1X18 (SUTURE) IMPLANT
SUT VIC AB 0 CT1 27 (SUTURE) ×4
SUT VIC AB 0 CT1 27XBRD ANBCTR (SUTURE) ×2 IMPLANT
SUT VIC AB 2-0 CTX 36 (SUTURE) ×4 IMPLANT
SUT VIC AB 3-0 CT1 27 (SUTURE)
SUT VIC AB 3-0 CT1 TAPERPNT 27 (SUTURE) IMPLANT
SUT VIC AB 3-0 SH 27 (SUTURE)
SUT VIC AB 3-0 SH 27X BRD (SUTURE) IMPLANT
SYR CONTROL 10ML LL (SYRINGE) ×2 IMPLANT
TAPE CLOTH SURG 4X10 WHT LF (GAUZE/BANDAGES/DRESSINGS) ×1 IMPLANT
TOWEL OR 17X24 6PK STRL BLUE (TOWEL DISPOSABLE) ×4 IMPLANT
TRAY FOLEY CATH 14FR (SET/KITS/TRAYS/PACK) ×2 IMPLANT
WATER STERILE IRR 1000ML POUR (IV SOLUTION) ×1 IMPLANT

## 2011-09-10 NOTE — Progress Notes (Signed)
adden to above progress note:  Notified CNM, Nigel Bridgeman, MD in surgery.

## 2011-09-10 NOTE — Op Note (Signed)
OPERATIVE NOTE  Patient's Name: Felicia Monroe  Date of Birth: [birth year]   Medical Records Number: [id number]   Date of Operation: 09/10/2011   Preoperative diagnosis:  [redacted] weeks gestation Prior C/S  Prior Myometomy  Desires Sterilization Obesity Desires Repeat Cesarean Section  Postoperative diagnosis:  [redacted] weeks gestation Prior C/S  Prior Myometomy  Desires Sterilization Obesity Desires Repeat Cesarean Section Pelvic adhesions  Procedure:  Repeat low transverse cesarean section Bilateral tubal sterilization procedure Lysis of adhesions Placement of a Jackson-Pratt drain  Surgeon:  Leonard Schwartz, M.D.  Assistant:  Lavera Guise, certified nurse midwife  Anesthesia:  Spinal  Disposition:  Felicia Monroe is a 41 y.o. female, gravida 2 para 1 -0 -0-1, who presents at [redacted] weeks gestation. The patient has been followed at the [facility] obstetrics and gynecology division of [facility] health care for women. This pregnancy has been complicated by a prior cesarean section. The patient desires a repeat cesarean section. She understands the indications for her procedure and she accepts the risk of, but not limited to, anesthetic complications, bleeding, infections, and possible damage to the surrounding organs.  Findings:  A  female Felicia Monroe) was delivered from a occiput transverse position. The Apgar scores were 8 and 9. The uterus, fallopian tubes, and ovaries were normal for the gravid state. There were adhesions between the omentum and the anterior uterus. There adhesions between the omentum and the fundus of the uterus. The bladder was adhered to the lower uterine segment. There were dense adhesions present between the fascia, and the abdominal musculature. The fallopian tubes were adhered to the posterior uterus.  Procedure:  The patient was taken to the operating room where a spinal anesthetic was given. The patient's abdomen was prepped with  Chloraprep. The perineum was prepped with betadine. A Foley catheter was placed in the bladder. The patient was sterilely draped. The lower abdomen was injected with half percent Marcaine with epinephrine. A low transverse incision was made in the abdomen and carried sharply through the subcutaneous tissue, the fascia, and the anterior peritoneum. Dissection was complicated by the patient's dense adhesions and the patient's obese abdomen. Care was taken not to in her the bladder as we dissected the bladder from the lower uterine segment. An incision was made in the lower uterine segment. The incision was extended in a low transverse fashion. The membranes were ruptured. The fetal head was delivered with the assistance of a vacuum extractor. The mouth and nose were suctioned. The remainder of the infant was then delivered. The cord was clamped and cut. The infant was handed to the awaiting pediatric team. The placenta was removed. The placenta was sent to the cord blood Registry team. The placenta was then sent to labor and delivery. The uterine cavity was cleaned of amniotic fluid, clotted blood, and membranes. The uterine incision was closed using a running locking suture of 2-0 Vicryl. An imbricating suture of 2-0 Vicryl was placed. The pelvis was vigorously irrigated. Hemostasis was adequate. The adhesions between the omentum and the uterus needed to be removed so that we could perform the tubal sterilization procedure. The right fallopian tube was identified and followed to its fimbriated end. Hemostasis was adequate. An identical procedure was carried out on the opposite side. The anterior peritoneum and the abdominal musculature were closed using 2-0 Vicryl. The fascia was closed using a running suture of 0 Vicryl followed by 3 interrupted sutures of 0 Vicryl. A Jackson-Pratt drain was placed in the subcutaneous space  and brought out through the lateral quadrant. He was sutured into place using 2-0 silk. The  subcutaneous layer was closed using interrupted sutures of 2-0 Vicryl. The skin was reapproximated using a subcuticular suture of 3-0 Monocryl. Sponge, needle, and instrument counts were correct on 2 occasions. The estimated blood loss for the procedure was 900 cc. The patient tolerated her procedure well. She was transported to the recovery room in stable condition. The infant remained in the operating room with the parents. The placenta was sent to labor and delivery. The cut portions of the fallopian tubes were sent to pathology.  Leonard Schwartz, M.D.

## 2011-09-10 NOTE — Progress Notes (Signed)
CRITICAL VALUE ALERT  Critical value received:  Hg 6.5  Date of notification: 5/17  Time of notification:  2050  Critical value read back: yes  Nurse who received alert: Reche Dixon, RN  MD notified (1st page):  Nigel Bridgeman, CNM notified  Time of first page:  2050  MD notified (2nd page):  Time of second page:  Responding MD:  Nigel Bridgeman, CNM  Time MD responded:

## 2011-09-10 NOTE — Progress Notes (Addendum)
Called to see post-op C/S patient with decreased urine output (525 cc since admission to earlier today). BP 80s/50s, pulse 90-100. Patient sleepy, but arousable.  Assessment: Patient alert to my exam, holding baby, but "tired". Fundus firm, 2 below. Incision dressing CDI. Lochia scant. JP drain with 10-15 cc serosanguinous drainage. Urine output approx 25 cc from 7-8pm.  Plan: IV bolus Re-check CBC. Close observation. Dr. Stefano Gaul notified.  Nigel Bridgeman, CNM, [place] 09/10/11 9:25p  Addendum:  Results for orders placed during the hospital encounter of 09/10/11 (from the past 24 hour(s))  CBC     Status: Abnormal   Collection Time   09/10/11  8:17 PM      Component Value Range   WBC 24.4 (*) 4.0 - 10.5 (K/uL)   RBC 2.29 (*) 3.87 - 5.11 (MIL/uL)   Hemoglobin 6.5 (*) 12.0 - 15.0 (g/dL)   HCT 46.9 (*) 62.9 - 46.0 (%)   MCV 84.3  78.0 - 100.0 (fL)   MCH 28.4  26.0 - 34.0 (pg)   MCHC 33.7  30.0 - 36.0 (g/dL)   RDW 52.8  41.3 - 24.4 (%)   Platelets 271  150 - 400 (K/uL)   Filed Vitals:   09/10/11 1830 09/10/11 1945 09/10/11 2014 09/10/11 2042  BP: 82/53 88/52 81/54  97/64  Pulse: 108 99 97 103  Temp: 97.6 F (36.4 C) 97.5 F (36.4 C)    TempSrc: Oral Oral    Resp: 16 16 18 18   Height:      Weight:      SpO2: 98% 98% 98% 98%   Consulted with Dr. Stefano Gaul. Will continue to observe. Check orthostatics in the am. Repeat CBC in am.  Nigel Bridgeman, CNM, [place] 09/10/11 9:30pm

## 2011-09-10 NOTE — Transfer of Care (Signed)
Immediate Anesthesia Transfer of Care Note  Patient: Felicia Monroe  Procedure(s) Performed: Procedure(s) (LRB): CESAREAN SECTION WITH BILATERAL TUBAL LIGATION (N/A)  Patient Location: PACU  Anesthesia Type: Spinal  Level of Consciousness: awake, alert  and oriented  Airway & Oxygen Therapy: Patient Spontanous Breathing  Post-op Assessment: Report given to PACU RN and Post -op Vital signs reviewed and stable  Post vital signs: Reviewed and stable  Complications: No apparent anesthesia complications

## 2011-09-10 NOTE — Anesthesia Preprocedure Evaluation (Signed)
Anesthesia Evaluation  Patient identified by MRN, date of birth, ID band Patient awake    Reviewed: Allergy & Precautions, H&P , NPO status , Patient's Chart, lab work & pertinent test results, reviewed documented beta blocker date and time   History of Anesthesia Complications Negative for: history of anesthetic complications  Airway Mallampati: III TM Distance: >3 FB Neck ROM: full    Dental  (+) Teeth Intact   Pulmonary neg pulmonary ROS,  breath sounds clear to auscultation        Cardiovascular negative cardio ROS  Rhythm:regular Rate:Normal     Neuro/Psych negative neurological ROS  negative psych ROS   GI/Hepatic Neg liver ROS, GERD- (with pregnancy, takes tums)  ,  Endo/Other  Morbid obesity  Renal/GU negative Renal ROS  negative genitourinary   Musculoskeletal   Abdominal   Peds  Hematology  (+) Blood dyscrasia (hgb 9.0), anemia , H/o blood transfusion during myomectomy   Anesthesia Other Findings   Reproductive/Obstetrics (+) Pregnancy (h/o c/s x1 and prior myomectomy surgery)                           Anesthesia Physical Anesthesia Plan  ASA: III  Anesthesia Plan: Spinal   Post-op Pain Management:    Induction:   Airway Management Planned:   Additional Equipment:   Intra-op Plan:   Post-operative Plan:   Informed Consent: I have reviewed the patients History and Physical, chart, labs and discussed the procedure including the risks, benefits and alternatives for the proposed anesthesia with the patient or authorized representative who has indicated his/her understanding and acceptance.   Dental Advisory Given  Plan Discussed with: Surgeon and CRNA  Anesthesia Plan Comments:         Anesthesia Quick Evaluation

## 2011-09-10 NOTE — Anesthesia Procedure Notes (Signed)
Spinal  Patient location during procedure: OR Start time: 09/10/2011 9:41 AM Staffing Anesthesiologist: Brayton Caves R Performed by: anesthesiologist  Preanesthetic Checklist Completed: patient identified, site marked, surgical consent, pre-op evaluation, timeout performed, IV checked, risks and benefits discussed and monitors and equipment checked Spinal Block Patient position: sitting Prep: DuraPrep Patient monitoring: heart rate, cardiac monitor, continuous pulse ox and blood pressure Approach: midline Location: L3-4 Injection technique: single-shot Needle Needle type: Sprotte  Needle gauge: 24 G Needle length: 9 cm Assessment Sensory level: T4 Additional Notes Patient identified.  Risk benefits discussed including failed block, incomplete pain control, headache, nerve damage, paralysis, blood pressure changes, nausea, vomiting, reactions to medication both toxic or allergic, and postpartum back pain.  Patient expressed understanding and wished to proceed.  All questions were answered.  Sterile technique used throughout procedure.  CSF was clear.  No parasthesia or other complications.  Please see nursing notes for vital signs.

## 2011-09-10 NOTE — H&P (Signed)
The patient was interviewed and examined today.  The previously documented history and physical examination was reviewed. There are no changes. The operative procedure was reviewed. The risks and benefits were outlined again. The specific risks include, but are not limited to, anesthetic complications, bleeding, infections, and possible damage to the surrounding organs. The patient's questions were answered.  We are ready to proceed as outlined. The likelihood of the patient achieving the goals of this procedure is very likely.   Aune Adami Vernon Kinsey Karch, M.D.  

## 2011-09-10 NOTE — Progress Notes (Signed)
Pt restless, sleepy; low urine output (concentrated); fundus firm at umbilicus,  Abdomen not guarded; small lochia since arrival to unit; abd dressing old drainage; B/Ps trending downward, tachycardiac. MD notified. Orders received.

## 2011-09-10 NOTE — Anesthesia Postprocedure Evaluation (Signed)
Anesthesia Post Note  Patient: Felicia Monroe  Procedure(s) Performed: Procedure(s) (LRB): CESAREAN SECTION WITH BILATERAL TUBAL LIGATION (N/A)  Anesthesia type: Spinal  Patient location: PACU  Post pain: Pain level controlled  Post assessment: Post-op Vital signs reviewed  Last Vitals:  Filed Vitals:   09/10/11 1215  BP: 126/64  Pulse: 67  Temp: 36.4 C  Resp: 19    Post vital signs: Reviewed  Level of consciousness: awake  Complications: No apparent anesthesia complications

## 2011-09-11 DIAGNOSIS — D649 Anemia, unspecified: Secondary | ICD-10-CM | POA: Diagnosis not present

## 2011-09-11 DIAGNOSIS — R34 Anuria and oliguria: Secondary | ICD-10-CM | POA: Diagnosis not present

## 2011-09-11 DIAGNOSIS — Z98891 History of uterine scar from previous surgery: Secondary | ICD-10-CM | POA: Diagnosis not present

## 2011-09-11 HISTORY — DX: History of uterine scar from previous surgery: Z98.891

## 2011-09-11 LAB — COMPREHENSIVE METABOLIC PANEL
ALT: 13 U/L (ref 0–35)
Alkaline Phosphatase: 81 U/L (ref 39–117)
BUN: 12 mg/dL (ref 6–23)
CO2: 20 mEq/L (ref 19–32)
Chloride: 99 mEq/L (ref 96–112)
GFR calc Af Amer: 62 mL/min — ABNORMAL LOW (ref >90)
GFR calc non Af Amer: 54 mL/min — ABNORMAL LOW (ref >90)
Glucose, Bld: 127 mg/dL — ABNORMAL HIGH (ref 70–99)
Potassium: 4.4 mEq/L (ref 3.5–5.1)
Total Bilirubin: 0.5 mg/dL (ref 0.3–1.2)
Total Protein: 4.1 g/dL — ABNORMAL LOW (ref 6.0–8.3)

## 2011-09-11 LAB — URINALYSIS, ROUTINE W REFLEX MICROSCOPIC
Ketones, ur: NEGATIVE mg/dL
Leukocytes, UA: NEGATIVE
Nitrite: NEGATIVE
Protein, ur: 100 mg/dL — AB
Urobilinogen, UA: 1 mg/dL (ref 0.0–1.0)

## 2011-09-11 LAB — ABO/RH: ABO/RH(D): B POS

## 2011-09-11 LAB — CCBB MATERNAL DONOR DRAW

## 2011-09-11 LAB — CBC
Hemoglobin: 5.4 g/dL — CL (ref 12.0–15.0)
MCH: 27.7 pg (ref 26.0–34.0)
RBC: 1.95 MIL/uL — ABNORMAL LOW (ref 3.87–5.11)
WBC: 24.2 10*3/uL — ABNORMAL HIGH (ref 4.0–10.5)

## 2011-09-11 MED ORDER — ACETAMINOPHEN 325 MG PO TABS
650.0000 mg | ORAL_TABLET | Freq: Once | ORAL | Status: AC
  Administered 2011-09-11: 650 mg via ORAL
  Filled 2011-09-11: qty 2

## 2011-09-11 MED ORDER — SODIUM CHLORIDE 0.9 % IV SOLN: 500.0000 mL | Freq: Once | INTRAVENOUS | Status: DC

## 2011-09-11 MED ORDER — DIPHENHYDRAMINE HCL 25 MG PO CAPS
25.0000 mg | ORAL_CAPSULE | Freq: Once | ORAL | Status: AC
  Administered 2011-09-11: 25 mg via ORAL
  Filled 2011-09-11: qty 1

## 2011-09-11 NOTE — Progress Notes (Signed)
CRITICAL VALUE ALERT  Critical value received: Hg 5.4  Date of notification:  5/18  Time of notification:  0630  Critical value read back:yes  Nurse who received alert:  Reche Dixon, RN  MD notified (1st page):  Manfred Arch, CNM  Time of first page:  0630  MD notified (2nd page):  Time of second page:  Responding MD:  Manfred Arch, CNM  Time MD responded:

## 2011-09-11 NOTE — Progress Notes (Signed)
Pt agrees to blood transfusion, risks and benefits discussed. Plan for 3 units, HL IV after infusion and foley out then, collaboration with Dr. Stefano Gaul. Lavera Guise, CNM

## 2011-09-11 NOTE — Addendum Note (Signed)
Addendum  created 09/11/11 0925 by Earmon Phoenix, CRNA   Modules edited:Notes Section

## 2011-09-11 NOTE — Progress Notes (Signed)
See orthostatics. Patient feels better. Transfusion discussed and recommended. She will consider and let us know. Bmet and CBC in the morning.  AVS

## 2011-09-11 NOTE — Progress Notes (Signed)
Subjective: Postpartum Day 1: Cesarean Delivery Patient reports incisional pain. Some dizziness yesterday but not today.    Objective: Vital signs in last 24 hours: Temp:  [97.4 F (36.3 C)-98 F (36.7 C)] 98 F (36.7 C) (05/18 0800) Pulse Rate:  [62-108] 88  (05/18 0800) Resp:  [16-21] 18  (05/18 0800) BP: (80-130)/(37-78) 90/59 mmHg (05/18 0800) SpO2:  [96 %-99 %] 96 % (05/18 0800)  Physical Exam:  General: alert, cooperative and no distress Lochia: appropriate Uterine Fundus: firm Incision: Dressing is clean and dry DVT Evaluation: No evidence of DVT seen on physical exam.  Urine output: 25-35 cc per hour  Creatinine: 1.22   Basename 09/11/11 0510 09/10/11 2017  HGB 5.4* 6.5*  HCT 16.4* 19.3*    Assessment/Plan: Status post Cesarean section. Postoperative course complicated by Anemia and decreased urine output . No evidence of bleeding at this point. Check orthostatics blood pressures and pulses. Transfusion discussed. Risk and benefits reviewed. I believe her blood transfusion will help the patient increase her urine output. I believe her elevation in creatinine will improve once her urine output improved.  Felicia Monroe 09/11/2011, 10:12 AM

## 2011-09-11 NOTE — Progress Notes (Signed)
Urine output still minimal, despite initial 500 cc bolus and 250 cc/hour since.  Filed Vitals:   09/10/11 2100 09/10/11 2329 09/11/11 0126 09/11/11 0335  BP: 80/52 91/59 91/62  101/65  Pulse: 108 96 92 88  Temp: 97.7 F (36.5 C)  97.7 F (36.5 C)   TempSrc: Oral  Oral   Resp: 18 16 18 16   Height:      Weight:      SpO2: 97% 99% 97% 98%   Resting comfortably.  Lochia minimal. Dressing CDI. JP drain with small amount serosanguinous drainage. VSS.  Consulted with Dr. Stefano Gaul. Will send UA. I added CMP to CBC this am. Will CTO.  Nigel Bridgeman, CNM, [place] 09/11/11 4:20a

## 2011-09-11 NOTE — Anesthesia Postprocedure Evaluation (Signed)
  Anesthesia Post-op Note  Patient: Felicia Monroe  Procedure(s) Performed: Procedure(s) (LRB): CESAREAN SECTION WITH BILATERAL TUBAL LIGATION (N/A)  Patient Location: Mother/Baby  Anesthesia Type: Spinal  Level of Consciousness: awake, alert  and oriented  Airway and Oxygen Therapy: Patient Spontanous Breathing  Post-op Pain: mild  Post-op Assessment: Patient's Cardiovascular Status Stable and Respiratory Function Stable  Post-op Vital Signs: stable  Complications: No apparent anesthesia complications

## 2011-09-12 LAB — BASIC METABOLIC PANEL
CO2: 24 mEq/L (ref 19–32)
Chloride: 103 mEq/L (ref 96–112)
Glucose, Bld: 109 mg/dL — ABNORMAL HIGH (ref 70–99)
Potassium: 4 mEq/L (ref 3.5–5.1)
Sodium: 135 mEq/L (ref 135–145)

## 2011-09-12 LAB — CBC
Hemoglobin: 6.7 g/dL — CL (ref 12.0–15.0)
MCH: 28.4 pg (ref 26.0–34.0)
RBC: 2.36 MIL/uL — ABNORMAL LOW (ref 3.87–5.11)

## 2011-09-12 NOTE — Progress Notes (Signed)
Subjective: Postpartum Day 2: Cesarean Delivery Patient reports tolerating PO.    Objective: Vital signs in last 24 hours: Temp:  [98.2 F (36.8 C)-98.8 F (37.1 C)] 98.5 F (36.9 C) (05/19 0653) Pulse Rate:  [87-108] 97  (05/19 0653) Resp:  [18-20] 20  (05/19 0653) BP: (94-120)/(53-77) 119/77 mmHg (05/19 1610)  Physical Exam:  General: alert, cooperative and no distress Lochia: appropriate Uterine Fundus: firm Incision: clean dressing DVT Evaluation: No evidence of DVT seen on physical exam.   Basename 09/12/11 0700 09/11/11 0510  HGB 6.7* 5.4*  HCT 19.7* 16.4*   BMET    Component Value Date/Time   NA 135 09/12/2011 0700   K 4.0 09/12/2011 0700   CL 103 09/12/2011 0700   CO2 24 09/12/2011 0700   GLUCOSE 109* 09/12/2011 0700   BUN 12 09/12/2011 0700   CREATININE 0.71 09/12/2011 0700   CALCIUM 8.6 09/12/2011 0700   GFRNONAA >90 09/12/2011 0700   GFRAA >90 09/12/2011 0700      Assessment/Plan: Status post Cesarean section. Doing well postoperatively.  Better after transfusion. Improved creatinine. Ambulate. Home in the morning.  Seerat Peaden V 09/12/2011, 10:00 AM

## 2011-09-13 LAB — CBC
HCT: 20.3 % — ABNORMAL LOW (ref 36.0–46.0)
Hemoglobin: 6.6 g/dL — CL (ref 12.0–15.0)
MCV: 85.3 fL (ref 78.0–100.0)
RBC: 2.38 MIL/uL — ABNORMAL LOW (ref 3.87–5.11)
WBC: 9.2 10*3/uL (ref 4.0–10.5)

## 2011-09-13 MED ORDER — DIPHENHYDRAMINE HCL 25 MG PO CAPS
25.0000 mg | ORAL_CAPSULE | Freq: Once | ORAL | Status: AC
  Administered 2011-09-13: 25 mg via ORAL
  Filled 2011-09-13: qty 1

## 2011-09-13 MED ORDER — ACETAMINOPHEN 325 MG PO TABS
650.0000 mg | ORAL_TABLET | Freq: Once | ORAL | Status: AC
  Administered 2011-09-13: 650 mg via ORAL
  Filled 2011-09-13: qty 2

## 2011-09-13 MED ORDER — LOPERAMIDE HCL 2 MG PO CAPS
2.0000 mg | ORAL_CAPSULE | ORAL | Status: DC | PRN
  Administered 2011-09-13 (×2): 2 mg via ORAL
  Filled 2011-09-13 (×2): qty 1

## 2011-09-13 NOTE — Progress Notes (Addendum)
Patient ID: Felicia Monroe, female   DOB: 11-Aug-1970, 41 y.o.   MRN: [id number] Subjective: Postpartum Day 3: Cesarean Delivery and BTL  Patient reports tolerating PO, + flatus and no problems voiding.  Diarrhea since yest no complaints, up ad lib without syncope Pain well controlled with po meds BF started, but baby lost weight, so supp w bottle, baby on phototherapy Mood stable, bonding well   Objective: Vital signs in last 24 hours: Temp:  [98.3 F (36.8 C)-98.7 F (37.1 C)] 98.4 F (36.9 C) (05/20 0756) Pulse Rate:  [99-120] 102  (05/20 0801) Resp:  [18-21] 20  (05/20 0801) BP: (133-164)/(74-83) 141/74 mmHg (05/20 0801) Filed Vitals:   09/13/11 0550 09/13/11 0756 09/13/11 0800 09/13/11 0801  BP: 148/82 133/75 139/78 141/74  Pulse: 112 99 109 102  Temp: 98.3 F (36.8 C) 98.4 F (36.9 C)    TempSrc: Oral Oral    Resp: 20 18 18 20   Height:      Weight:      SpO2:         Physical Exam:  General: alert and no distress Breasts:  Soft, nipples intact Heart: RRR Lungs: CTAB Abdomen: BS x4, JP drain intact Uterine Fundus: firm Incision: healing well Lochia: appropriate DVT Evaluation: No evidence of DVT seen on physical exam. Negative Homan's sign. No significant calf/ankle edema.   Basename 09/13/11 0525 09/12/11 0700  HGB 6.6* 6.7*  HCT 20.3* 19.7*    Assessment/Plan: Status post Cesarean section. Doing well postoperatively.  Anemia, s/p blood tx yesterday Slightly tachycardic Will recheck vitals and consider d/c later today  Amador Braddy M 09/13/2011, 9:05 AM   D/W Dr Su Hilt Will tx 2units PRBC's and plan d/c tmrow rv'd pt, pt agrees.  Will give imodium for diarrhea

## 2011-09-14 DIAGNOSIS — I10 Essential (primary) hypertension: Secondary | ICD-10-CM

## 2011-09-14 HISTORY — DX: Essential (primary) hypertension: I10

## 2011-09-14 LAB — TYPE AND SCREEN
Unit division: 0
Unit division: 0

## 2011-09-14 LAB — DIFFERENTIAL
Basophils Absolute: 0 10*3/uL (ref 0.0–0.1)
Basophils Relative: 0 % (ref 0–1)
Eosinophils Absolute: 0.2 10*3/uL (ref 0.0–0.7)
Lymphs Abs: 1.7 10*3/uL (ref 0.7–4.0)
Neutrophils Relative %: 69 % (ref 43–77)

## 2011-09-14 LAB — COMPREHENSIVE METABOLIC PANEL
ALT: 14 U/L (ref 0–35)
AST: 16 U/L (ref 0–37)
Albumin: 2.1 g/dL — ABNORMAL LOW (ref 3.5–5.2)
Alkaline Phosphatase: 82 U/L (ref 39–117)
Calcium: 8.3 mg/dL — ABNORMAL LOW (ref 8.4–10.5)
Potassium: 3.4 mEq/L — ABNORMAL LOW (ref 3.5–5.1)
Sodium: 139 mEq/L (ref 135–145)
Total Protein: 5.1 g/dL — ABNORMAL LOW (ref 6.0–8.3)

## 2011-09-14 LAB — CBC
MCH: 27.9 pg (ref 26.0–34.0)
MCHC: 32.9 g/dL (ref 30.0–36.0)
Platelets: 182 10*3/uL (ref 150–400)
RBC: 2.76 MIL/uL — ABNORMAL LOW (ref 3.87–5.11)
RDW: 15.3 % (ref 11.5–15.5)

## 2011-09-14 MED ORDER — DIPHENOXYLATE-ATROPINE 2.5-0.025 MG PO TABS
1.0000 | ORAL_TABLET | Freq: Four times a day (QID) | ORAL | Status: DC | PRN
  Administered 2011-09-14: 2 via ORAL
  Filled 2011-09-14: qty 2

## 2011-09-14 MED ORDER — LABETALOL HCL 200 MG PO TABS
200.0000 mg | ORAL_TABLET | Freq: Two times a day (BID) | ORAL | Status: DC
  Administered 2011-09-14 – 2011-09-15 (×3): 200 mg via ORAL
  Filled 2011-09-14 (×5): qty 1

## 2011-09-14 MED ORDER — HYDROCORTISONE ACE-PRAMOXINE 1-1 % RE FOAM
1.0000 | Freq: Two times a day (BID) | RECTAL | Status: DC
  Administered 2011-09-14 – 2011-09-15 (×2): 1 via RECTAL
  Filled 2011-09-14: qty 10

## 2011-09-14 MED ORDER — WITCH HAZEL-GLYCERIN EX PADS: MEDICATED_PAD | CUTANEOUS | Status: DC | PRN

## 2011-09-14 NOTE — Progress Notes (Addendum)
Patient ID: Felicia Monroe, female   DOB: 02/24/1971, 41 y.o.   MRN: [id number] Subjective: Postpartum Day 3: Cesarean Delivery Patient reports tolerating PO, + flatus, + BM and no problems voiding.    Objective: Vital signs in last 24 hours: Temp:  [97.8 F (36.6 C)-99.1 F (37.3 C)] 99.1 F (37.3 C) (05/21 1543) Pulse Rate:  [68-83] 80  (05/21 1834) Resp:  [18-20] 20  (05/21 1543) BP: (145-185)/(77-103) 155/77 mmHg (05/21 1834) SpO2:  [98 %-100 %] 98 % (05/21 0618)  Physical Exam:  General: alert Lochia: appropriate Uterine Fundus: firm Incision: healing well, mild drainage from JP drain DVT Evaluation: No evidence of DVT seen on physical exam. Excoriations just out side of rectum.  No hemorrhoids seen  Basename 09/14/11 0110 09/13/11 0525  HGB 7.7* 6.6*  HCT 23.4* 20.3*    Assessment/Plan: Status post Cesarean section. Doing well postoperatively.  Continue current care. awatiting 24 hour urine results Pt stable on labetalol Pt is POD #4 Proctofoam and tucks for discomfort  Dana Debo A 09/14/2011, 7:23 PM

## 2011-09-14 NOTE — Progress Notes (Addendum)
PostOp day 4  s/p repeat c/s w BTL and severe anemia  S/p total 5 units PRBC's tx'd 2 today and 3 yesterday  S: pt denies any c/o, has been resting some tonight, infant remains at bs on phototherapy Denies HA/N/V epigastric pain Denies edema States some incisional discomfort but well controlled w pain meds Continues to have diarrhea tonight Ambulating well, no dizziness , last episode of diarrhea  around 0030 Bottle feeding tonight, breasts leaking some    Filed Vitals:   09/13/11 2320 09/13/11 2322 09/13/11 2330 09/14/11 0105  BP: 185/103 183/103 162/81 160/93  Pulse: 73   68  Temp:    97.8 F (36.6 C)  TempSrc: Oral   Oral  Resp: 20   18  Height:      Weight:      SpO2: 100%   100%   O:  GEN: A&O, NAD HENT: WNL Lungs: CTAB Heart: RRR ABD: soft, bs x4 incision healing JP dran scant serosanguinous drainage Ext: mild BLE - no change from this AM, reflexes 2+ neg clous Pelvic deferred  Results for orders placed during the hospital encounter of 09/10/11 (from the past 24 hour(s))  CBC     Status: Abnormal   Collection Time   09/13/11  5:25 AM      Component Value Range   WBC 9.2  4.0 - 10.5 (K/uL)   RBC 2.38 (*) 3.87 - 5.11 (MIL/uL)   Hemoglobin 6.6 (*) 12.0 - 15.0 (g/dL)   HCT 16.1 (*) 09.6 - 46.0 (%)   MCV 85.3  78.0 - 100.0 (fL)   MCH 27.7  26.0 - 34.0 (pg)   MCHC 32.5  30.0 - 36.0 (g/dL)   RDW 04.5  40.9 - 81.1 (%)   Platelets 192  150 - 400 (K/uL)  PREPARE RBC (CROSSMATCH)     Status: Normal   Collection Time   09/13/11 10:30 AM      Component Value Range   Order Confirmation ORDER PROCESSED BY BLOOD BANK    CBC     Status: Abnormal   Collection Time   09/14/11  1:10 AM      Component Value Range   WBC 8.3  4.0 - 10.5 (K/uL)   RBC 2.76 (*) 3.87 - 5.11 (MIL/uL)   Hemoglobin 7.7 (*) 12.0 - 15.0 (g/dL)   HCT 91.4 (*) 78.2 - 46.0 (%)   MCV 84.8  78.0 - 100.0 (fL)   MCH 27.9  26.0 - 34.0 (pg)   MCHC 32.9  30.0 - 36.0 (g/dL)   RDW 95.6  21.3 - 08.6 (%)   Platelets 182  150 - 400 (K/uL)  DIFFERENTIAL     Status: Normal   Collection Time   09/14/11  1:10 AM      Component Value Range   Neutrophils Relative 69  43 - 77 (%)   Neutro Abs 5.7  1.7 - 7.7 (K/uL)   Lymphocytes Relative 20  12 - 46 (%)   Lymphs Abs 1.7  0.7 - 4.0 (K/uL)   Monocytes Relative 8  3 - 12 (%)   Monocytes Absolute 0.7  0.1 - 1.0 (K/uL)   Eosinophils Relative 2  0 - 5 (%)   Eosinophils Absolute 0.2  0.0 - 0.7 (K/uL)   Basophils Relative 0  0 - 1 (%)   Basophils Absolute 0.0  0.0 - 0.1 (K/uL)  COMPREHENSIVE METABOLIC PANEL     Status: Abnormal   Collection Time   09/14/11  1:10 AM  Component Value Range   Sodium 139  135 - 145 (mEq/L)   Potassium 3.4 (*) 3.5 - 5.1 (mEq/L)   Chloride 106  96 - 112 (mEq/L)   CO2 24  19 - 32 (mEq/L)   Glucose, Bld 91  70 - 99 (mg/dL)   BUN 10  6 - 23 (mg/dL)   Creatinine, Ser 4.09  0.50 - 1.10 (mg/dL)   Calcium 8.3 (*) 8.4 - 10.5 (mg/dL)   Total Protein 5.1 (*) 6.0 - 8.3 (g/dL)   Albumin 2.1 (*) 3.5 - 5.2 (g/dL)   AST 16  0 - 37 (U/L)   ALT 14  0 - 35 (U/L)   Alkaline Phosphatase 82  39 - 117 (U/L)   Total Bilirubin 1.5 (*) 0.3 - 1.2 (mg/dL)   GFR calc non Af Amer >90  >90 (mL/min)   GFR calc Af Amer >90  >90 (mL/min)  URIC ACID     Status: Normal   Collection Time   09/14/11  1:10 AM      Component Value Range   Uric Acid, Serum 4.2  2.4 - 7.0 (mg/dL)  LACTATE DEHYDROGENASE     Status: Normal   Collection Time   09/14/11  1:10 AM      Component Value Range   LDH 241  94 - 250 (U/L)   A/P HYpertension postpartum  Anemia postpartum - hgb improving after transfusion  Will send straight cath to check for protein Will switch order from imodium to lomotil - 2 tabs now  cdiff pending, tho not likely Will d/w Dr Su Hilt   Addendum: After d/w Dr Su Hilt will send 24hr urine - will place foley now, straight cath had not been done, yet Begin labetalol 200mg  PO now

## 2011-09-14 NOTE — Progress Notes (Signed)
Blood pressure 185/103, no signs of PIH, diarrhea, and low urine output reported to [facility] CNM. Told to recheck vitals and awaiting further orders. Will continue to monitor.

## 2011-09-14 NOTE — Progress Notes (Addendum)
Per RN pt cont to have diarrhea and elevated BP Filed Vitals:   09/13/11 2145 09/13/11 2320 09/13/11 2322 09/13/11 2330  BP: 158/84 185/103 183/103 162/81  Pulse: 81 73    Temp: 97.8 F (36.6 C)     TempSrc: Oral Oral    Resp: 20 20    Height:      Weight:      SpO2:  100%      Ordered cdiff, cbc, cmet, uric acid and ldh Will recheck BP in 1 hour

## 2011-09-15 LAB — PROTEIN, URINE, 24 HOUR
Collection Interval-UPROT: 24 hours
Protein, Urine: 16 mg/dL
Urine Total Volume-UPROT: 1475 mL

## 2011-09-15 MED ORDER — IBUPROFEN 600 MG PO TABS: 600.0000 mg | ORAL_TABLET | Freq: Four times a day (QID) | ORAL | Status: AC | PRN

## 2011-09-15 MED ORDER — OXYCODONE-ACETAMINOPHEN 5-325 MG PO TABS: 1.0000 | ORAL_TABLET | ORAL | Status: DC | PRN

## 2011-09-15 NOTE — Discharge Summary (Signed)
Obstetric Discharge Summary Reason for Admission: cesarean section Prenatal Procedures: ultrasound Intrapartum Procedures: cesarean: low cervical, transverse, tubal ligation and spinal anesthesia Postpartum Procedures: transfusion 5 units total and 24 hr urine to r/o PreEclampsia and initiation of Labetalol for PPHTN Complications-Operative and Postpartum: anemia; elevated BP following transfusions. Hemoglobin  Date Value Range Status  09/14/2011 7.7* 12.0-15.0 (g/dL) Final     HCT  Date Value Range Status  09/14/2011 23.4* 36.0-46.0 (%) Final  Hospital course:  Pt admitted 09/10/11 for scheduled repeat LTCS and BTL at 39 weeks.  Preop Hgb=9.  Prepped for OR and procedure performed by attending Dr. Leonard Schwartz, MD.  Delivery of VFI at 1009.  Pt and newborn tolerated procedure well.  By evening change of shift, pt's UOP less than 30cc/hr, BP trending low (80s/50s), tachycardic, and pt restless.  IVF bolus ordered and stat cbc sent, and Hgb down to 6.5.  CBC, CMP & u/a ordered for AM POD#1; labs WNL except hgb down to low of 5.4 and creatinine elevated at 1.22.  Dr. Stefano Gaul rec'd transfusion, and 3 units PRBC's given.  Hgb post-transfusion up to 6.7 on POD#2, but creatinine improved to 0.71.  Pt w/ loose stools while on mother/baby and treated w/ both immodium and lomotil and stool specimen sent for c-diff cx and negative.  Over the course of POD#2 to POD#3 pt's BP started to trend up.  PIH labs were drawn on POD#3 and Hgb stable, but still at 6.6.  Dr. Su Hilt rec'd pt receive additional 2 units of PRB and pt agreed to transfusion.  On POD#4, BP reached a max of 185/103, and pt started on Labetalol 200mg  po bid; 24 hr urine was also started and was WNL=236mg  total protein.  PIH labs on POD#4 WNL; Hgb up to 7.7.  BP on 09/15/11 (POD#5) ranging 140-155/75-83 on Labetalol .  Newborn had been receiving phototherapy for jaundice, but was therapy d/c'd by 09/15/11; pt was lactating, but b/c of  newborn's weight loss and jaundice pediatrician had rec'd to also supplement w/ formula, so pt doing breast and bottle.  Per c/w dr. Stefano Gaul, pt deemed to have received full benefit of her hospital stay after nml 24 hr urine result on 5/22, and was d/c'd home in stable condition w/ PIH precautions.  She had been using minimal percocet for pain.  Was moving about w/o dizziness or syncope by time of d/c.  Her parents are in town from [place] to help her at home for a few more days.  Plan 1 week f/u for BP check at [facility] (lives in [place] so unable to get [employer] check), or f/u prn; continue Labetalol 200mg  po bid.    Physical Exam:  General: alert, cooperative, no distress and morbidly obese Lochia: appropriate, rubra Uterine Fundus: firm; below umbilicus Incision: healing well, no significant drainage, JP d/c'd w/o difficulty DVT Evaluation: No evidence of DVT seen on physical exam. Negative Homan's sign. Calf/Ankle edema is present.  Discharge Diagnoses: Term Pregnancy-delivered and Lactating; s/p repeat LTCS and BTL; PP HTN; severe anemia; Monroe/o anal fissures; obese; newborn w/ jaundice and received phototherapy and formula supplementation.  Discharge Information: Date: 09/15/2011 POD#5 Activity: pelvic rest Diet: iron-rich Medications: PNV, Ibuprofen, Iron, Percocet and Labetalol 200mg  po bid Condition: stable Instructions: refer to practice specific booklet and PIH precautions Discharge to: home Follow-up Information    Follow up with [facility] in 1 week. (office will call to make appointment, or call as needed with any questions or concerns)  Newborn Data: Live born female "[place]"  (delivery provider:  Dr. Leonard Schwartz, MD) Birth Weight: 7 lb 0.7 oz (3195 g) APGAR: 9, 9  Home with mother.  Felicia Monroe 09/15/2011, 5:59 PM

## 2011-09-15 NOTE — Discharge Instructions (Signed)
Cesarean Delivery Care After Refer to this sheet in the next few weeks. These instructions provide you with information on caring for yourself after your procedure. Your caregiver may also give you more specific instructions. Your treatment has been planned according to current medical practices, but problems sometimes occur. Call your caregiver if you have any problems or questions after your procedure. HOME CARE INSTRUCTIONS Healing will take time. You will have discomfort, tenderness, swelling, and bruising at the surgery site for a couple of weeks. This is normal and will get better as time goes on. Activity  Rest as much as possible the first 2 weeks.   When possible, have someone help you with your household activities and your baby for 2 to 3 weeks.   Limit your housework and social activity. Increase your activity gradually as your strength returns.   Do not climb stairs more than 2 to 3 times a day.   Do not lift anything heavier than your baby.   Follow your caregiver's instructions about driving a car.   Exercise only as directed by your caregiver.  Nutrition  You may return to your usual diet. Eat a well-balanced diet.   Drink enough fluid to keep your urine clear or pale yellow.   Keep taking your prenatal or multivitamins.   Do not drink alcohol until your caregiver says it is okay.  Elimination You should return to your usual bowel function. If you develop constipation, ask your caregiver about taking a mild laxative that will help you go to the bathroom. Bran foods and fluids help with constipation. Gradually add fruit, vegetables, and bran to your diet.  Hygiene  You may shower, wash your hair, and take tub baths unless your caregiver tells you otherwise.   Continue perineal care until your vaginal bleeding and discharge stops.   Do not douche or use tampons until your caregiver says it is okay.  Fever If you feel feverish or have shaking chills, take your  temperature. The fever may indicate infection. Infections can be treated with antibiotic medicine. Pain Control and Medicine  Only take over-the-counter or prescription medicine as directed by your caregiver. Do not take aspirin. It can cause bleeding.   Do not drive when taking pain medicine.   Talk to your caregiver about restarting or adjusting your normal medicines.  Incision Care  Clean your cut (incision) gently with soap and water, then pat dry.   If your caregiver says it is okay, leave the incision without a bandage (dressing) unless it is draining fluid or irritated.   If you have small adhesive strips across the incision and they do not fall off within 7 days, carefully peel them off.   Check the incision daily for increased redness, drainage, swelling, or separation of skin.   Hug a pillow when coughing or sneezing. This helps to relieve pain.  Vaginal Care You may have a vaginal discharge or bleeding for up to 6 weeks. If the vaginal discharge becomes bright red, bad smelling, heavy in amount, has blood clots, or if you have burning or frequent urination, call your caregiver.If your bleeding slows down and then gets heavier, your body is telling you to slow down and relax more. Sexual Intercourse  Check with your caregiver before resuming sexual activity. Often, after 4 to 6 weeks, if you feel good and are well rested, sexual activity may be resumed. Avoid positions that strain the incision site.   You can become pregnant before you have a period.   If you decide to have sexual intercourse, use birth control if you do not want to become pregnant right away.  Health Practices  Keep all your postpartum appointments as recommended by your caregiver. Generally, your caregiver will want to see you in 2 to 3 weeks.   Continue with your yearly pelvic exams.   Continue monthly self-breast exams and yearly physical exams with a Pap test.  Breast Care  If you are not  breastfeeding and your breasts become tender, hard, or leak milk, you may wear a tight-fitting bra and apply ice to your breasts.   If you are breastfeeding, wear a good support bra.   Call your caregiver if you have breast pain, flu-like symptoms, fever, or hardness and reddening of your breasts.  Postpartum Blues You may have a period of low spirits or "blues" after your baby is born. Discuss your feelings with your partner, family, and friends. This may be caused by the changing hormone levels in your body. You may want to contact your caregiver if this is worrisome. Miscellaneous  Limit wearing support panties or control-top hose.   If you breastfeed, you may not have a period for several months or longer. This is normal for the nursing mother. If you do not menstruate within 6 weeks after you stop breastfeeding, see your caregiver.   If you are not breastfeeding, you can expect to menstruate within 6 to 10 weeks after birth. If you have not started by the 11th week, check with your caregiver.  SEEK MEDICAL CARE IF:   There is swelling, redness, or increasing pain in the wound area.   You have pus coming from the wound.   You notice a bad smell from the wound or surgical dressing.   You have pain, redness, and swelling from the intravenous (IV) site.   Your wound breaks open (the edges are not staying together).   You feel dizzy or feel like fainting.   You develop pain or bleeding when you urinate.   You develop diarrhea.   You develop nausea and vomiting.   You develop abnormal vaginal discharge.   You develop a rash.   You have any type of abnormal reaction or develop an allergy to your medicine.   Your pain is not relieved by your medicine or becomes worse.   Your temperature is 101 F (38.3 C), or is 100.4 F (38 C) taken 2 times in a 4 hour period.  SEEK IMMEDIATE MEDICAL CARE:  You develop a temperature of 102 F (38.9 C) or higher.   You develop abdominal  pain.   You develop chest pain.   You develop shortness of breath.   You faint.   You develop pain, swelling, or redness of your leg.   You develop heavy vaginal bleeding with or without blood clots.  Document Released: 01/02/2002 Document Revised: 04/01/2011 Document Reviewed: 07/08/2010 [facility] Patient Information 2012 [place], [place]. Breastfeeding Challenges Breastfeeding is often the best way to feed your baby. Challenges may discourage you from breastfeeding. But solutions can usually be found to help you. Some babies have conditions that may interfere with or make breastfeeding more difficult. But, in all of the following cases, breastfeeding is still best for a baby's health.  ADVANTAGES OF BREASTFEEDING  Breastfed babies tend to be healthier and less affected by disease.   Breastfed babies may have better brain development and be less likely to be overweight than formulafed babies.   Breastfeeding also benefits the mother. It will  give you time to be close to your baby and helps create a strong bond. It also:   Delays the return of your periods.   Stimulates your uterus to contract back to normal.   Helps you lose some of the weight you gained during pregnancy.   Breastfeeding is also cheaper than formula feeding. It also does not require mixing formula, heating bottles, or washing extra dishes.   Breastfeeding mothers have a lower risk of developing breast cancer.   Breastfeeding should be encouraged in women with gestational diabetes and diabetes type I and type II.  BREASTFEEDING CHALLENGES  Breastfeeding involves taking the time to get to know your baby's patterns and responding to his or her cues. Once breastfeeding is well established, feedings usually become more regular and more widely spaced. Some mothers do not nurse their babies because they come across problems early on. If at all possible, begin breastfeeding your baby within an hour after delivery. The  first milk you produce is called colostrum. It is packed with nutrients and disease-fighting substances. These will help nourish and protect your baby against infections as he or she grows up.   Some babies are unable to breastfeed because of premature birth and small size along with weakness and difficulty sucking. Sometimes with birth defects of the mouth (cleft lip or cleft palate) the mother may be able to pump breastmilk to give to her baby. Some digestive problems (breast milk jaundice, galactosemia) may be reasons not to nurse. See a lactation consultant if you have a breast infection or breast abscess, breast cancer or other cancer, previous surgery or radiation treatment, or inadequate milk supply (uncommon).  SOME MOTHERS ARE ADVISED NOT TO BREASTFEED DUE TO HEALTH PROBLEMS SUCH AS:  Serious illnesses.   Severe malnutrition.   Undergoing radiation therapy.   Taking psychiatric medication.   Active herpes lesions on the breast.   Chickenpox or shingles.   Active, untreated tuberculosis.   HIV (human immunodeficiency virus) infection.   Drug or alcohol addiction.   Undergoing radioactive iodine therapy.   Leukemia human cell Type I or II.  BREASTFEEDING SHOULD NOT BE PAINFUL  It is natural for minor problems to arise in first time breastfeeding. Problems you may have and some solutions are as follows:   Nipple soreness may be caused by:   Improper position of baby.   Improper feeding techniques.   Improper nipple care.   For many women, there is no identified cause. A simple change in your baby's position while feeding may relieve nipple soreness. Some breastfeeding mothers report nipple soreness only during the initial adjustment period.   If there is tenderness at first, it should gradually go away as the days go by. Poor latch-on and positioning are common causes of sore nipples. This is usually because the baby is not getting enough of the areola into his or her  mouth, and is sucking mostly on the nipple. The areola is the colored portion around the nipple. In general, nurse early and often. Nurse with the nipple and areola in the baby's mouth, not just the nipple. And feed your baby on demand.   If you have sore nipples and put off feedings because of the pain, this can lead to your breasts becoming overly full. This may lead to plugged milk ducts in the breast followed by engorgement or even infection of the breasts. If your baby is latched on correctly, he or she should be able to nurse as long as needed  without causing any pain. If it hurts, take the baby off of your breast and try again. Ask for help if it is still painful for you.   Check the positioning of your baby's body and the way she latches on and sucks. To minimize soreness, your baby's mouth should be open wide with as much of the areola in his or her mouth as possible. You should find that it feels better right away once the baby is positioned correctly.   Do not delay feedings. Try to relax so your let-down reflex comes easily. You also can hand-express a little milk before beginning the feeding so your baby does not clamp down harder, waiting for the milk to come.   If your nipples are very sore, it may be helpful to change positions each time you nurse. This puts the pressure on a different part of the nipple.   After nursing, you can also express a few drops of milk and gently rub it on your nipples. Human milk has natural healing properties. Let your nipples air-dry after feeding, or wear a soft-cotton shirt.   Wearing a nipple shield during nursing will not relieve sore nipples. They actually can prolong soreness by making it hard for the baby to learn to nurse without the shield.   Avoid wearing bras or clothes that are too tight and put pressure on your nipples. If you wear a bra, get one that offers good support to your breasts.   Change nursing pads often to avoid trapping in  moisture. Only use cotton pads.   Avoid using soap, ointments or other chemicals on your nipples. Make sure to avoid products that must be removed before nursing. Washing with clean water is all that is necessary to keep your nipples and breasts clean.   Try rubbing pure lanolin on your nipples after breastfeeding to soothe the pain.   Making sure you get enough rest, eating healthy foods, and getting enough fluids also can help the healing process. If you have very sore nipples, you can ask your caregiver about using non-aspirin pain relievers.   Another cause of sore nursing is a condition called thrush. This is a fungal infection that can form on your nipples from the milk. Other signs of thrush include itching, flaking and drying skin, tender or pink skin. The infection also can form in the baby's mouth from having contact with your nipples. There it appears as little white spots on the inside of the cheeks, gums, or tongue. It also can appear as a diaper rash on your baby that will not go away by using regular diaper rash ointments. If you have any of these symptoms or think you have thrush, contact your doctor and your baby's doctor, or a Advertising copywriter. A lactation consultant is a breastfeeding Risk analyst. You can get medication for your nipples and for your baby.   If you still have sore nipples after following the above tips, you may need to see someone who is trained in breastfeeding, like a Advertising copywriter.  ENGORGEMENT Engorgement is a condition after pregnancy, when your breasts feel very hard and painful. You also may have breast swelling, tenderness, warmth, redness, throbbing and flattening of the nipple. Engorgement may cause a low-grade fever. This can be confused with a breast infection. Engorgement is the result of the milk building up. It usually happens during the third to fifth day after birth. This slows circulation. When blood and lymph move through the  breasts,  fluid from the blood vessels can seep into the breast tissues. All of the following can cause engorgement.  Poor positioning.   Infrequent feedings.   Giving supplementary bottles of water, juice, formula, or breast milk or using a pacifier. All of these cut down on your feeding and may lead to engorgement.   Changing the breastfeeding schedule with decreasing in feeding.   The baby changes the nursing pattern.   Having a baby with a weak suck who is not able to nurse effectively.   Fatigue, stress, or anemia in the mother.   An overabundant milk supply.   Nipple damage.   Breast abnormalities.  Engorgement can lead to plugged ducts or a breast infection. So it is important to try to prevent it before this happens. If treated properly, engorgement should only last for one to two days.  Minimize engorgement by making sure the baby is latched on and positioned correctly at your breast.   Nurse frequently after birth. Allow the baby to nurse as long as he/she likes, as long as he/she is latched on well and sucking effectively.   In the early days when your milk is coming in, you should awaken a sleepy baby every 2 to 3 hours to breastfeed. Breastfeeding often on the affected side helps to remove the milk and keeps milk moving freely. This prevents overfilling of the breast.   Avoid additional bottles and pacifiers.   Try hand expressing or pumping a little milk to first soften the breast, areola, and nipple before breastfeeding. Or massage the breast before feeding.   Cold compresses in between feedings can help ease pain and swelling.   If you are returning to work, try to pump your milk on the same schedule your baby was fed.   Eat a well balanced diet and drink plenty of fluids.   Use a well-fitting, supportive bra that is not too tight.   If your engorgement lasts for more than two days even after treating it, contact a Advertising copywriter.   Use a breast pump to  keep up with your nursing schedule.   Use a breast pump if your baby is not taking enough milk or you feel you may be getting engorged.  PLUGGED DUCTS AND INFECTION Plugged ducts and breast infection (mastitis) are common sources of sore breasts postpartum. It is common for many women to have a plugged duct in the breast at some point if breastfeeding.   A plugged milk duct feels like a tender, sore, lump in the breast. It is not accompanied by a fever or other symptoms. It happens when a milk duct does not properly drain. Then, pressure builds up behind the plug, and surrounding tissue becomes inflamed. A plugged duct usually only occurs in one breast at a time.   A breast infection (mastitis), on the other hand, is soreness or a lump in the breast that can be accompanied by a fever and/or flu-like symptoms. You may feel run down or very achy. Some women with a breast infection also have nausea and vomiting. You also may have yellowish discharge from the nipple that looks like colostrum. Or the breasts feel warm or hot to the touch and appear pink or red. A breast infection can occur when other family members have a cold or the flu. Like a plugged duct, it usually only occurs in one breast. It is not always easy to tell the difference between a breast infection and a plugged duct. Both have similar  symptoms and can improve within 24 to 48 hours.   Treatment for plugged ducts and breast infections is similar. But some breast infections need to also be treated with an antibiotic.   If mastitis is not treated quickly, it may lead to a breast abscess.   It may help to massage the area, starting behind the sore spot. Use your fingers in a circular motion and massage toward the nipple.   Breastfeed more often on the affected side. This helps loosen the plug, keeps the milk moving freely, and the breast from becoming overly full. Nursing every two hours, both day and night on the affected side first can be  helpful.   Getting extra sleep or relaxing with your feet up can help speed healing. Often a plugged duct or breast infection is the first sign that a mother is doing too much and becoming overly tired.   Wear a well-fitting supportive bra that is not too tight, since this can constrict milk ducts.   If you do not feel better within 24 hours of trying these steps, and you have a fever or your symptoms worsen, call your doctor. You may need an antibiotic. Also, if you have a breast infection in which both breasts look affected, or there is pus or blood in the milk, red streaks near the area, or your symptoms came on severely and suddenly, see your doctor right away.   Even if you need an antibiotic, continuing to breastfeed during treatment is best for both you and your baby. Most antibiotics will not affect your baby through your breast milk.  THRUSH Thrush (yeast) is a fungal infection that can form on your nipples or in your breast because it thrives on milk. The infection forms from an overgrowth of the candida organism. Candida usually exists in our bodies and is kept at healthy levels by the natural bacteria in our bodies. But, when the natural balance of bacteria is upset, candida can overgrow, causing an infection. Some of the things that can cause thrush include:   Having an overly moist environment on your skin or nipples that are sore or cracked.   Taking antibiotics, birth control pills or steroids.   Having a diet that contains large amounts of sugar or foods with yeast.   Having a chronic illness like HIV infection, diabetes, or anemia.  If you have sore nipples that last more than a few days even after you make sure your baby's latch and positioning is correct, or you suddenly get sore nipples after several weeks of unpainful nursing, you could have thrush. Some other signs of thrush include:   Pink, flaky, shiny, itchy or cracked nipples.   Deep pink and blistered nipples.    You also could have shooting pains deep in the breast during or after feedings, or achy breasts.  The infection also can form in your baby's mouth from having contact with your nipples, and appear as little white spots on the inside of the cheeks, gums, or tongue. It also can appear as a diaper rash (small red dots around a rash) on your baby that will not go away by using regular diaper rash ointments. Many babies with thrush refuse to nurse, or are gassy or cranky.  Solution:   If you or your baby have any of these symptoms, contact your doctor and your baby's doctor so you both can be correctly diagnosed.   You can get medication for your nipples and for your baby. Medication  for a mother is usually an ointment for the nipples. Your baby can be given a liquid medication for his/her mouth, and/or an ointment for any diaper rash.   Change disposable nursing pads often. Wash any towels or clothing that come in contact with the yeast in very hot water (above 122 F or 50 C).   Wear a clean bra every day. Wash your hands often, and wash your baby's hands often, especially if he or she sucks on his/her fingers.   Only use cotton pads.   Boil any pacifiers, bottle nipples, or toys your baby puts in his or her mouth once a day for 20 minutes to kill the thrush. After one week of treatment, discard pacifiers and nipples and buy new ones.   Boil daily for 20 minutes all breast pump parts that touch the milk.   Make sure other family members are free of thrush or other fungal infections. If they have symptoms, get them treatment.  NURSING STRIKE A nursing strike is when your baby has been nursing well for months, then suddenly loses interest in breastfeeding and begins to refuse the breast. A nursing strike can mean several things are happening with your baby and that she or he is trying to communicate with you to let you know that something is wrong. Not all babies will react the same to different  situations that can cause a nursing strike. Some will continue to breastfeed without a problem, others may just become fussy at the breast, and others will refuse the breast entirely. Some of the major causes of a nursing strike include:  Mouth pain from teething, a fungal infection, or a cold sore.   An ear infection.   Pain from a certain nursing position.   Being upset about a long separation from the mother or a major change in routine.   Being interested in other things around him or her.   A cold or stuffy nose that makes breathing difficult.   Reduced milk supply from supplementing with bottles or overuse of a pacifier.   Responding to the mother's strong reaction if the baby has bitten her.   Being upset about hearing arguing or people talking in a harsh voice with other family members while nursing.   Reacting to stress, over-stimulation, or having been repeatedly put off when wanting to nurse.   If your baby is on a nursing strike, it is normal to feel frustrated and upset, especially if your baby is unhappy. It is important not to feel guilty or that you have done something wrong. Your breasts also may become uncomfortable as the milk builds up.  Treatment  Try to express your milk manually or with a breast pump on the same schedule as the baby used to breastfeed to avoid engorgement and plugged ducts.   Try another feeding method temporarily to give your baby your milk, such as a cup, dropper, or spoon. Keep track of your baby's wet diapers to make sure he/she is getting enough milk (5 to 6 per day).   Keep offering your breast to the baby. If the baby is frustrated, stop and try again later. Try when the baby is sleeping or very sleepy.   Try various breastfeeding positions.   Focus on the baby with all of your attention and comfort him or her with extra touching and cuddling.   Try nursing while rocking and in a quiet room free of distractions.  INVERTED OR LARGE  NIPPLES Some women  have nipples that naturally are inverted, or that turn inward instead of protruding, or that are flat and do not protrude. Inverted or flat nipples can sometimes make it harder to breastfeed because your baby can have a harder time latching on. But remember that for breastfeeding to work, your baby has to latch on to both the nipple and the breast, so even inverted nipples can work just fine. Very large nipples can make it hard for the baby to get enough of the areola into his or her mouth to compress the milk ducts and get enough milk.  Know what type of nipples you have before you have your baby, so you can be prepared in case you have a problem getting your baby to latch on correctly.   Talk with a lactation consultant at the hospital or at a breastfeeding clinic for extra help if you have flat, inverted, or very large nipples.   Sometimes a Advertising copywriter can help inverted nipples to be pulled out with a small device before your baby is brought to your breast.   In many cases, inverted nipples will protrude more as the baby starts to latch on and as time passes. The baby's sucking will help.   Flat nipples cause fewer problems than inverted nipples. Good latch-on and positioning are usually enough to ensure that a baby latched to a flat nipple breastfeeds well.   The latch for babies of mothers with very large nipples will improve with time as the baby grows. In some cases, it might take several weeks to get the baby to latch well. A good milk supply helps insure that her baby will get enough milk.  RETURNING TO WORK More and more women are breastfeeding when they return to work because they believe in the benefits of breastfeeding. You can purchase or rent effective breast pumps and storage containers for your milk. Many employers are willing to set up special rooms for mothers who pump. But others are not as educated about the benefits of breastfeeding. Also, many women  are not able to take off as much time as they'd like after having their babies and might have to return to work before breastfeeding is well established. After you have your baby, take as much time off as possible. This will help you get your breastfeeding established well and may reduce the number of months you may need to pump your milk while you are at work.   If you plan to have your baby take a bottle of expressed breast milk while you are at work, you can introduce your baby to a bottle when he or she is around four weeks old. Otherwise, the baby might not accept the bottle later on. Once your baby is comfortable taking a bottle, it is a good idea to have Dad or another family member offer a bottle of pumped breast milk on a regular basis so the baby stays in practice.   Let your employer or Geophysical data processor know that you plan to continue breastfeeding once you return to work. Before you return to work, or even before you have your baby, start talking with your employer about breastfeeding. Do not be afraid to request a clean and private area where you can pump your milk. If you do not have your own office space, you can ask to use a supervisor's office during certain times. Or you can ask to have a clean, clutter free corner of a storage room. All you need  is a chair, a small table, and an outlet if you are using an electric pump. Many electric pumps also can run on batteries and do not require an outlet. You can lock the door and place a small sign on it that asks for some privacy. You can pump your breast milk during lunch or other breaks. You could suggest to your employer that you are willing to make up work time for time spent pumping milk.   After pumping, you can refrigerate your milk, place it in a cooler, or freeze it for the baby to be fed later. Many breast pumps come with carrying cases that have a section to store your milk with ice packs. If you do not have access to a  refrigerator, you can leave it at room temperatures for up to 6 hours.   Many employers are not aware of state laws that state they have to allow you to breastfeed at your job. Under these laws, your employer is required to set up a space for you to breastfeed and/or allow paid/unpaid time for breastfeeding employees. To see if your state has a breastfeeding law for employers, go to https://www.warren.info/ or call us at 1-800-LALECHE (in [place]).  JAUNDICE  Jaundice is a condition that is common in many newborns. It appears as a yellowing of the skin and eyes. It is caused by an excess of bilirubin, a yellow pigment that is a product in the blood. All babies are born with extra red blood cells that undergo a process of being broken down and eliminated from the body. Bilirubin levels in the blood can be high because of:  Higher production of it in a newborn.   Increased ability of the newborn intestine to absorb it.   Limited ability of the newborn liver to handle large amounts of it.  Many cases of jaundice do not need to be treated. Your baby's doctor will carefully monitor your baby's bilirubin levels. Sometimes infants have to be temporarily separated from their mothers to receive special treatment with phototherapy (aiming lights on the baby). In these cases, breastfeeding is encouraged but supplements may also be given to the baby. American Academy of Pediatrics advises against stopping breastfeeding in jaundiced babies and suggests continuing frequent breastfeeding, even during treatment. If your baby is jaundiced or develops jaundice, it is important to discuss with your baby's caregiver all possible treatment options. Share that you do not want to interrupt nursing if this is at all possible.  REFLUX  It is not unusual for babies to spit up after nursing. Usually, babies can spit up and show no other signs of illness. The spitting up disappears as the baby's digestive system matures.  As long as the baby has 6 to 8 wet diapers and at least 2 bowel movements in a 24 hour period (under 59 weeks of age), and your baby is gaining weight (at least 4 ounces a week) you can be assured your baby is getting enough milk.  However, some babies have a condition called gastroesophageal reflux (GER). This happens when the muscle at the opening of the stomach opens at the wrong times, allowing milk and food to come back up into the the tube in the throat (esophagus). Symptoms of GER can include:   Crying as if in discomfort.   Waking up frequently at night.   Problems swallowing.   Frequent red or sore throat.   Signs of asthma, bronchitis, wheezing or problems breathing.   Projectile vomiting.  Arching of the back as if in severe pain.   Slow weight gain.   Gagging or choking.   Frequent hiccupping or burping.   Severe spitting up, or spitting up after every feeding, or hours after eating.   Refusal to eat.  Many healthy babies might have some of these symptoms and do not have GER. But there are babies who might only have a few of these symptoms and have a severe case of GER. Not all babies with GER spit up or vomit.  Some babies with GER do not have a serious medical problem. But caring for them can be hard since they tend to be very fussy and wake up frequently at night. More severe cases of GER may need to be treated with medication if the baby, in addition to spitting up, also refuses to nurse, gains weight poorly or is losing weight, or has periods of gagging or choking.   If your baby spits up after every feeding and has any of the other symptoms mentioned above, it is best to see his or her doctor for a correct diagnosis. Other than GER, your baby could have another condition that needs treatment. If there are no other signs of illness, he/she could just be sensitive to a food in your diet or a medication he/she is receiving. If your baby has GER, it is important to try to  continue to breastfeed since breast milk still is more easily digested than formula. Try smaller, more frequent feedings, thorough burping, and putting the baby in an upright position during and after feedings.  CLEFT PALATE AND CLEFT LIP  Cleft palate and cleft lip are some of the most common birth defects that happen as a baby is developing in the womb. A cleft, or opening, in either the palate or lip can happen together or separately and both can be corrected through surgery. Both conditions can prevent babies from breastfeeding because a baby cannot form a good seal around the nipple and areola with his or her mouth, or get milk out of the breast well.   Cleft palate can happen on one or both sides of a baby's mouth and be partial or complete. Right after birth, a mother whose baby has a cleft palate can try to breastfeed her baby, and she can start expressing her milk right away to keep up her supply. Even if her baby cannot latch on well to her breast, the baby can be fed breast milk by cup. In some hospitals, babies with cleft palate are fitted with a mouthpiece called an obturator. This fits into the cleft and seals it for easier feeding. The baby should be able to exclusively breastfeed after surgery.   Cleft lip can happen on one or both sides of a baby's lip. But a mother can try different breastfeeding positions and use her thumb or breast to help fill in the opening left by the lip to form a seal around the breast. With cleft lip repair, breastfeeding may only have to be stopped for a few hours.   If your baby is born with a cleft palate or cleft lip, talk with a lactation consultant in the hospital for assistance as soon as possible. Human milk and early breastfeeding is still best for your baby's health.  TWINS OR MULTIPLES Mothers of twins or multiples might feel overwhelmed with the idea of breastfeeding more than one baby at a time. The benefits of human milk to both these mothers and  babies are the same as for all mothers and babies. When breastfeeding twins, your breast milk will increase to the amount the babies will need. You will have to take in more calories and liquids when nursing twins. If the babies are premature and unable to nurse, you can pump your breasts and freeze the milk until the babies are ready to nurse. But mothers of multiples get even more benefits from breastfeeding:  Their uterus contracts, which is helpful because it has stretched even more to hold more than one baby.   Hormones are released that relax the mother, which is helpful with the added stress of caring for more than one baby.   Eight to ten hours per week are saved because there is no need to prepare formula or bottles and the mother's milk is available right away.   Breastfeeding early and often for a mother of multiples is important to keep up her milk supply. A good latch-on for each baby is important to avoid sore nipples. Many mothers find that it is easier to nurse the babies together rather than separately, and that it gets easier as the babies get older. There are many breastfeeding holds that make it easier to nurse more than one baby at a time. If you are having multiples, talk with a lactation consultant about more ways you can successfully breastfeed your babies.  BREASTFEEDING DURING PREGNANCY While most mothers who are nursing a toddler stop breastfeeding if they find out they are pregnant, it is an individual choice to decide whether to keep breastfeeding during the pregnancy. It is not unsafe for the unborn child if you continue to breastfeed an older child during this time. But, if you are having some problems in your pregnancy such as uterine pain or bleeding, a history of preterm labor or problems gaining weight during pregnancy, your doctor may advise you to wean. Your child also may decide to wean on his or her own because pregnancy changes the amount and flavor of your milk.  Some women also choose to wean at this time because they have nipple soreness caused by pregnancy hormones, are nauseous, tire more easily, or find that their growing stomachs make breastfeeding uncomfortable. You will need more calories when you breastfeed while pregnant. Your milk production usually slows down around the fourth month of pregnancy.  BREASTFEEDING AFTER BREAST SURGERY   If you have had breast surgery, including breast implants, you might be worried about whether you will be able to breastfeed. The most important things that affect whether you can produce enough milk for your baby are how your surgery was done and where your incisions are, and the reasons for your surgery. For example, women who have had incisions in the fold under the breasts are less likely to have problems producing milk than women who have had incisions around or across the areola. Incisions around the areola can cut into milk ducts and nerves, where milk is produced and travels. Women who have had breast surgery to augment breasts that never fully developed may not have enough glands to produce a full milk supply.   If you had breast surgery and are worried about how it will affect breastfeeding, talk with a Advertising copywriter. If you are planning breast surgery and are worried about how it will affect breastfeeding, talk with your surgeon about ways he or she can preserve as much of the breast tissue and milk ducts as possible.  INDUCING LACTATION  Many mothers who adopt  want to breastfeed their babies and can do it successfully with some help. It is successful ? to  of the time it is tried. Many will need to supplement their breast milk with donated breast milk or infant formula. But some adoptive mothers can breastfeed exclusively, especially if they have been pregnant before. Lactation is a hormonal response to a physical action. So the stimulation of the baby nursing causes the body to see a need for and produce  milk. The more the baby nurses, the more a woman's body will produce milk.   Beginning a combination of hormone treatment several months before the baby is born should help facilitate lactation in many cases.   One thing you can do to prepare is to pump every 3 hours around the clock for two to three weeks before your baby arrives. Or you can wait until the baby arrives and starts to nurse. Breast milk can be frozen until you are ready to nurse the baby. A supplemental nursing system (SNS) or a lactation aid can help ensure that your baby gets enough nutrition and that your breasts are stimulated to produce milk at the same time.   If you are an adoptive mother who wants to breastfeed, you should see both a Advertising copywriter and your doctor for help in establishing an initial milk supply.  FOR MORE INFORMATION [employer]: www.llli.org Document Released: 10/04/2005 Document Revised: 04/01/2011 Document Reviewed: 12/27/2007 [facility] Patient Information 2012 [place], [place].

## 2011-09-16 ENCOUNTER — Other Ambulatory Visit: Payer: Self-pay

## 2011-09-16 ENCOUNTER — Other Ambulatory Visit: Payer: Self-pay | Admitting: Obstetrics and Gynecology

## 2011-09-16 MED ORDER — OXYCODONE-ACETAMINOPHEN 5-325 MG PO TABS: 1.0000 | ORAL_TABLET | ORAL | Status: AC | PRN

## 2011-09-16 MED ORDER — LABETALOL HCL 200 MG PO TABS: 200.0000 mg | ORAL_TABLET | Freq: Two times a day (BID) | ORAL | Status: DC

## 2011-09-16 NOTE — Progress Notes (Signed)
Pt D?C from hospital 09/15/11 with unsigned Percocet script. Order discontinued. Represcribed and given to husband.

## 2011-09-16 NOTE — Telephone Encounter (Signed)
TRIAGE/EPIC °

## 2011-09-16 NOTE — Telephone Encounter (Signed)
Triage/epic/last seen by AVS

## 2011-09-16 NOTE — Telephone Encounter (Signed)
TC to pt.  States was D/C'd from hospital 09/15/11.  Thought she was supposed to continue Labetalol but was not given RX.  Also Percocet RX was not signed.  Consult with [facility].  Labetalol ordered.   Pt may have RX for Vicodin if desires.  Pt prefers Percocet.  Pt's husband to office to obtain RX written by Dr SR.  F/U appt sched w/Dr SR 09/21/11.

## 2011-09-21 ENCOUNTER — Ambulatory Visit (INDEPENDENT_AMBULATORY_CARE_PROVIDER_SITE_OTHER): Payer: Managed Care, Other (non HMO) | Admitting: Obstetrics and Gynecology

## 2011-09-21 ENCOUNTER — Encounter: Payer: Self-pay | Admitting: Obstetrics and Gynecology

## 2011-09-21 VITALS — BP 160/88 | Temp 98.4°F | Resp 16 | Ht 65.0 in | Wt 258.0 lb

## 2011-09-21 DIAGNOSIS — O165 Unspecified maternal hypertension, complicating the puerperium: Secondary | ICD-10-CM

## 2011-09-21 MED ORDER — NIFEDIPINE ER OSMOTIC RELEASE 30 MG PO TB24: 30.0000 mg | ORAL_TABLET | Freq: Every day | ORAL | Status: DC

## 2011-09-21 NOTE — Progress Notes (Signed)
Post-cesarean and BTL #11  S: here for BP check. D/C from hospital 09/14/11 with Labetalol 200 mg BID     PIH labs and 24 hour urine 09/14/11 all normal     Reports great improvement in edema O/W no symptoms of PIH  O: BP 160/88      Minimal lower extremity edema  A: poorly controlled PP HTN  P: change to Procardia XL 30 mg daily     BP check tomorrow pm

## 2011-09-22 ENCOUNTER — Encounter: Payer: Self-pay | Admitting: Obstetrics and Gynecology

## 2011-09-22 ENCOUNTER — Ambulatory Visit (INDEPENDENT_AMBULATORY_CARE_PROVIDER_SITE_OTHER): Payer: Managed Care, Other (non HMO) | Admitting: Obstetrics and Gynecology

## 2011-09-22 VITALS — BP 140/84 | Wt 256.0 lb

## 2011-09-22 DIAGNOSIS — O165 Unspecified maternal hypertension, complicating the puerperium: Secondary | ICD-10-CM

## 2011-09-22 NOTE — Progress Notes (Signed)
BP check. Now on Procardia XL 30 mg daily for 48 hours. BP 140/84 Feeling much better No side effect Follow-up 4 weeks for post-partum visit

## 2011-09-22 NOTE — Progress Notes (Signed)
B/P check today pt is 2weeks pp.

## 2011-10-19 ENCOUNTER — Ambulatory Visit (INDEPENDENT_AMBULATORY_CARE_PROVIDER_SITE_OTHER): Payer: Managed Care, Other (non HMO) | Admitting: Obstetrics and Gynecology

## 2011-10-19 ENCOUNTER — Encounter: Payer: Self-pay | Admitting: Obstetrics and Gynecology

## 2011-10-19 NOTE — Progress Notes (Signed)
Date of delivery: 09/10/2011 Female Name: Felicia Monroe Vaginal delivery:no Cesarean section:yes Tubal ligation:yes GDM:no Breast Feeding:yes Bottle Feeding:yes Post-Partum Blues:no Abnormal pap:yes Normal GU function: yes Normal GI function:yes Returning to work:yes/will be returning back to work 11/17/2011 EPDS: 5  Declines contraception, has BTL.   HISTORY OF PRESENT ILLNESS  Ms. Felicia Monroe is a 41 y.o. year old female,G2P1002, who presents for a postpartum visit. She had a cesarean section and tubal ligation.  She is doing very well at this point.  Subjective:  Normal GI and GU function.  Objective:  BP 148/80  Temp 97.8 F (36.6 C)  Resp 16  Ht 5\' 5"  (1.651 m)  Wt 251 lb (113.853 kg)  BMI 41.77 kg/m2  Breastfeeding? Yes   GI: soft, non-tender; bowel sounds normal; no masses,  no organomegaly and incision: clean, dry and intact  External genitalia: normal general appearance Vaginal: normal without tenderness, induration or masses Cervix: normal appearance Adnexa: normal bimanual exam Uterus: normal size shape and consistency  Assessment:  Doing well following repeat cesarean section and tubal ligation  Plan:  Return to normal activities. EPDS: 5 The patient will stop taking her Procardia.  She will see her primary physician for management of her blood pressure.  Return to office in 6 month(s) for annual exam.   Leonard Schwartz M.D.  10/19/2011 6:40 PM

## 2011-12-23 ENCOUNTER — Ambulatory Visit (INDEPENDENT_AMBULATORY_CARE_PROVIDER_SITE_OTHER): Payer: Managed Care, Other (non HMO) | Admitting: Internal Medicine

## 2011-12-23 VITALS — BP 140/94 | HR 72 | Temp 97.9°F | Wt 260.0 lb

## 2011-12-23 DIAGNOSIS — N390 Urinary tract infection, site not specified: Secondary | ICD-10-CM

## 2011-12-23 DIAGNOSIS — D649 Anemia, unspecified: Secondary | ICD-10-CM

## 2011-12-23 DIAGNOSIS — O165 Unspecified maternal hypertension, complicating the puerperium: Secondary | ICD-10-CM

## 2011-12-23 LAB — CBC WITH DIFFERENTIAL/PLATELET
Basophils Relative: 0.5 % (ref 0.0–3.0)
Eosinophils Absolute: 0.1 10*3/uL (ref 0.0–0.7)
Eosinophils Relative: 1.8 % (ref 0.0–5.0)
HCT: 34.4 % — ABNORMAL LOW (ref 36.0–46.0)
Lymphs Abs: 2.3 10*3/uL (ref 0.7–4.0)
MCHC: 33.5 g/dL (ref 30.0–36.0)
MCV: 86 fl (ref 78.0–100.0)
Monocytes Absolute: 0.4 10*3/uL (ref 0.1–1.0)
Neutrophils Relative %: 58.2 % (ref 43.0–77.0)
Platelets: 244 10*3/uL (ref 150.0–400.0)
RBC: 4 Mil/uL (ref 3.87–5.11)

## 2011-12-23 LAB — POCT URINALYSIS DIPSTICK
Leukocytes, UA: NEGATIVE
Protein, UA: NEGATIVE
Urobilinogen, UA: 0.2
pH, UA: 6

## 2011-12-23 LAB — FERRITIN: Ferritin: 116.6 ng/mL (ref 10.0–291.0)

## 2011-12-23 NOTE — Progress Notes (Signed)
  Subjective:    Patient ID: Felicia Monroe, female    DOB: 1970-07-25, 41 y.o.   MRN: [id number]  HPI Acute visit, we discussed  the following issues UTI? Status post C-section 08/2011, she had a Foley catheter, c/o  dysuria for 2 weeks after the procedure. At this point she is concerned because urine has "a strong odor like medicine"  on and off. Elevated BP, after her C-section, her BP went quite high, her gynecologist prescribe medication (Procardia?), 6 weeks later it was discontinued. Since then has check his BP maybe once or twice, last reading was 140/79. Also concerned about anemia, reports that her hemoglobin dropped significantly at the time of  her C-section, status post 5 PRBCs per patient. She is taking a prenatal vitamin and wonders about her hemoglobin levels.   Past Medical History: Goiter Elevated BP G2P2 Anemia Past Surgical History: c-section x 2  BTL 08/2011 fibroid removal 2005  Family History: breast ca-- no colon ca-- F , dx in his 19s  colon polyps-- B x 2 (2nd time at age 90) and sister  CAD-- aunts  DM--M, S, aunt  HTN-- M, F , B  Social History: Married, children x 2 denies smoking ETOH-- rarely   Review of Systems No dysuria or gross hematuria. No difficulty urinating or urinary frequency. No fever chills No vaginal discharge Patient is breast-feeding.     Objective:   Physical Exam General -- alert, well-developed, and overweight appearing. No apparent distress.  Abdomen--soft, non-tender, no distention, no masses, no HSM, no guarding, and no rigidity.  No CVA tenderness Extremities-- no pretibial edema bilaterally  Neurologic-- alert & oriented X3 and strength normal in all extremities. Psych-- Cognition and judgment appear intact. Alert and cooperative with normal attention span and concentration.  not anxious appearing and not depressed appearing.        Assessment & Plan:

## 2011-12-23 NOTE — Assessment & Plan Note (Addendum)
History of anemia, status post blood transfusions after a C-section 08/2011, on prenatal vitamins. Patient quite concerned about her levels, will check a CBC, iron and ferritin. Further advice would results.

## 2011-12-23 NOTE — Patient Instructions (Addendum)
Low-salt diet, daily exercise for  30 minutes. Check the  blood pressure 2 or 3 times a week, be sure it is between 110/60 and 140/85. If it is consistently higher or lower, let me know. Please come back for a checkup in 3 months to see about your blood pressure.

## 2011-12-23 NOTE — Assessment & Plan Note (Signed)
UTI? No symptoms except for "medicine odor" in the urine. Udip showed some WBCs. Plan: Urine culture, treat if appropriate.

## 2011-12-23 NOTE — Assessment & Plan Note (Signed)
The history of present illness, BP was elevated after the C-section, she took apparently Procardia for few weeks. BP slightly elevated today. We discussed about low salt diet, exercise. Will monitor her BP. Recheck in 3 months.

## 2011-12-26 LAB — URINE CULTURE

## 2011-12-28 MED ORDER — AMOXICILLIN 500 MG PO CAPS: 1000.0000 mg | ORAL_CAPSULE | Freq: Two times a day (BID) | ORAL | Status: AC

## 2011-12-28 NOTE — Addendum Note (Signed)
Addended by: Edwena Felty T on: 12/28/2011 05:02 PM   Modules accepted: Orders

## 2012-07-05 ENCOUNTER — Encounter: Payer: Self-pay | Admitting: *Deleted

## 2012-07-05 ENCOUNTER — Emergency Department (INDEPENDENT_AMBULATORY_CARE_PROVIDER_SITE_OTHER)
Admission: EM | Admit: 2012-07-05 | Discharge: 2012-07-05 | Disposition: A | Discharge status: Home / Self Care | Payer: Managed Care, Other (non HMO) | Source: Home / Self Care | Attending: Family Medicine | Admitting: Family Medicine

## 2012-07-05 DIAGNOSIS — H1010 Acute atopic conjunctivitis, unspecified eye: Secondary | ICD-10-CM

## 2012-07-05 DIAGNOSIS — J302 Other seasonal allergic rhinitis: Secondary | ICD-10-CM

## 2012-07-05 DIAGNOSIS — M25521 Pain in right elbow: Secondary | ICD-10-CM

## 2012-07-05 DIAGNOSIS — M25529 Pain in unspecified elbow: Secondary | ICD-10-CM

## 2012-07-05 DIAGNOSIS — H1045 Other chronic allergic conjunctivitis: Secondary | ICD-10-CM

## 2012-07-05 HISTORY — DX: Essential (primary) hypertension: I10

## 2012-07-05 MED ORDER — LODOXAMIDE TROMETHAMINE 0.1 % OP SOLN: 1.0000 [drp] | Freq: Four times a day (QID) | OPHTHALMIC | Status: DC

## 2012-07-05 NOTE — ED Notes (Signed)
Pt c/o bilateral clear eye drainage x 5 days. She has taken claritin D with no relief. She also c/o RT elbow pain radiating to her fingers x 2 days. Denies injury. She has taken Naproxen with no relief.

## 2012-07-05 NOTE — ED Provider Notes (Signed)
History     CSN: [id number]  Arrival date & time 07/05/12  1041   First MD Initiated Contact with Patient 07/05/12 1120      Chief Complaint  Patient presents with  . Eye Drainage  . Elbow Pain       HPI Comments: Patient complains of increased lacrimation in both eyes for about 5 days, but no redness or discomfort.  She wears contacts.  She also notes increased sinus congestion for about two weeks.  She has a history of seasonal allergies.  Her symptoms did not respond to one dose of Claritin D.  No sore throat or URI symptoms. She also complains of two day history of vague pain in right posterior elbow, but recalls no injury.  She uses her right arm to hold her nursing infant.  The history is provided by the patient.    Past Medical History  Diagnosis Date  . Gestational age less than 24 weeks     15weeks  . H/O chlamydia infection 04/07/93  . Obesity   . AMA (advanced maternal age) multigravida 35+   . H/O infertility 2004  . H/O polydactyly   . Irregular periods/menstrual cycles   . Uterine fibroid 10/1999  . Monilial vaginitis 12/2001  . Hx of hyperprolactinemia 08/2002  . Microadenoma 08/2010  . Abnormal Pap smear 1995  . 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 H/O candidiasis   . Anemia   . Blood transfusion 2006    [facility]  . Heartburn in pregnancy   . Hypertension     Past Surgical History  Procedure Laterality Date  . Cesarean section    . Myomectomy  07/2003  . Uterine fibroid surgery  2005    myomectomy x 1    Family History  Problem Relation Age of Onset  . Cancer Father 107    colon  . Hypertension Father   . Colon polyps Brother     x 2  . Hypertension Brother   . Colon polyps Sister   . Coronary artery disease      aunt  . Diabetes Mother   . Hypertension Mother   . Diabetes Sister   . Hypertension Brother   . Heart disease Maternal Grandmother   . Diabetes Maternal Grandmother     History  Substance Use Topics  . Smoking status: Never Smoker    . Smokeless tobacco: Never Used  . Alcohol Use: Yes    OB History   Grav Para Term Preterm Abortions TAB SAB Ect Mult Living   2 2 1       2       Review of Systems No sore throat No cough No pleuritic pain No wheezing + nasal congestion + post-nasal drainage No sinus pain/pressure No itchy/red eyes, + clear eye discharge No earache No hemoptysis No SOB No fever/chills No nausea No vomiting No abdominal pain No diarrhea No urinary symptoms No skin rashes No fatigue No myalgias No headache + right elbow pain, now improving. Used OTC meds without relief  Allergies  Review of patient's allergies indicates no known allergies.  Home Medications   Current Outpatient Rx  Name  Route  Sig  Dispense  Refill  . lodoxamide (ALOMIDE) 0.1 % ophthalmic solution   Both Eyes   Place 1 drop into both eyes 4 (four) times daily.   10 mL   3   . Prenatal Vit-Fe Fumarate-FA (PRENATAL MULTIVITAMIN) TABS   Oral   Take 1 tablet by mouth  every morning.           BP 165/93  Pulse 79  Temp(Src) 97.5 F (36.4 C) (Oral)  Wt 274 lb (124.286 kg)  BMI 45.6 kg/m2  SpO2 99%  LMP 06/22/2012  Breastfeeding? Yes  Physical Exam Nursing notes and Vital Signs reviewed. Appearance:  Patient appears stated age, and in no acute distress.  Patient is obese (BMI 45.6) Eyes:  Pupils are equal, round, and reactive to light and accomodation.  Extraocular movement is intact.  Conjunctivae are not inflamed.  No discharge noted Ears:  Canals normal.  Tympanic membranes normal.  Nose:  Moderately congested turbinates.  No sinus tenderness.  Pharynx:  Normal Neck:  Supple.  No adenopathy Lungs:  Clear to auscultation.  Breath sounds are equal.  Heart:  Regular rate and rhythm without murmurs, rubs, or gallops.  Extremities:  No edema.  Right elbow has full range of motion.  There is mild tenderness only over olecranon at insertion of triceps tendon.   Skin:  No rash present.   ED Course   Procedures  none      1. Seasonal allergic conjunctivitis   2. Seasonal allergic rhinitis   3. Right elbow pain; possible mild tendonitis at insertion of triceps tendon.       MDM  Recommend trial of plain Claritin.  If eye lacrimation does not improve, try Alomide ophth soln. Use Claritin for allergy symptoms.  Try saline nasal irrigation. Apply ice pack to right elbow 3 to 4 times daily. Followup with [facility] if not improving about two weeks.         Lattie Haw, MD 07/05/12 [phone]

## 2012-10-04 ENCOUNTER — Ambulatory Visit (INDEPENDENT_AMBULATORY_CARE_PROVIDER_SITE_OTHER): Payer: Managed Care, Other (non HMO) | Admitting: Internal Medicine

## 2012-10-04 ENCOUNTER — Encounter: Payer: Self-pay | Admitting: Internal Medicine

## 2012-10-04 VITALS — BP 132/82 | HR 70 | Ht 65.0 in | Wt 272.0 lb

## 2012-10-04 DIAGNOSIS — Z0000 Encounter for general adult medical examination without abnormal findings: Secondary | ICD-10-CM

## 2012-10-04 HISTORY — DX: Encounter for general adult medical examination without abnormal findings: Z00.00

## 2012-10-04 LAB — CBC WITH DIFFERENTIAL/PLATELET
Basophils Absolute: 0 10*3/uL (ref 0.0–0.1)
Basophils Relative: 0.5 % (ref 0.0–3.0)
Eosinophils Relative: 1.2 % (ref 0.0–5.0)
HCT: 34.9 % — ABNORMAL LOW (ref 36.0–46.0)
Hemoglobin: 11.7 g/dL — ABNORMAL LOW (ref 12.0–15.0)
Lymphocytes Relative: 29.9 % (ref 12.0–46.0)
Lymphs Abs: 2.2 10*3/uL (ref 0.7–4.0)
Monocytes Relative: 5.4 % (ref 3.0–12.0)
Neutro Abs: 4.7 10*3/uL (ref 1.4–7.7)
RBC: 4.06 Mil/uL (ref 3.87–5.11)
RDW: 13.1 % (ref 11.5–14.6)
WBC: 7.4 10*3/uL (ref 4.5–10.5)

## 2012-10-04 LAB — COMPREHENSIVE METABOLIC PANEL
ALT: 14 U/L (ref 0–35)
BUN: 9 mg/dL (ref 6–23)
CO2: 21 mEq/L (ref 19–32)
Calcium: 9.2 mg/dL (ref 8.4–10.5)
Chloride: 108 mEq/L (ref 96–112)
Creatinine, Ser: 0.5 mg/dL (ref 0.4–1.2)
GFR: 168.56 mL/min (ref >60.00)
Total Bilirubin: 0.8 mg/dL (ref 0.3–1.2)

## 2012-10-04 LAB — FERRITIN: Ferritin: 141.4 ng/mL (ref 10.0–291.0)

## 2012-10-04 LAB — LIPID PANEL
HDL: 44.7 mg/dL (ref >39.00)
LDL Cholesterol: 50 mg/dL (ref 0–99)
VLDL: 10.4 mg/dL (ref 0.0–40.0)

## 2012-10-04 LAB — TSH: TSH: 1.26 u[IU]/mL (ref 0.35–5.50)

## 2012-10-04 NOTE — Progress Notes (Signed)
  Subjective:    Patient ID: Felicia Monroe, female    DOB: 05-18-1970, 42 y.o.   MRN: [id number]  HPI CPX  Past Medical History  Diagnosis Date  . Gestational age less than 24 weeks     15weeks  . H/O chlamydia infection 04/07/93  . Obesity   . AMA (advanced maternal age) multigravida 35+   . H/O infertility 2004  . H/O polydactyly   . Irregular periods/menstrual cycles   . Uterine fibroid 10/1999  . Monilial vaginitis 12/2001  . Hx of hyperprolactinemia 08/2002  . Microadenoma 08/2010  . Abnormal Pap smear 1995  . 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 H/O candidiasis   . Anemia   . Blood transfusion 2006    [facility]  . Heartburn in pregnancy   . Hypertension    Past Surgical History  Procedure Laterality Date  . Cesarean section    . Myomectomy  07/2003  . Uterine fibroid surgery  2005    myomectomy x 1  . Tubal ligation     History   Social History  . Marital Status: Married    Spouse Name: N/A    Number of Children: 2  . Years of Education: N/A   Occupational History  . [employer]   .     Social History Main Topics  . Smoking status: Never Smoker   . Smokeless tobacco: Never Used  . Alcohol Use: Yes     Comment: socially  . Drug Use: No  . Sexually Active: Not Currently    Birth Control/ Protection: None     Comment: BTL   Other Topics Concern  . Not on file   Social History Narrative  . No narrative on file   Family History  Problem Relation Age of Onset  . Colon cancer Father 49    colon  . Hypertension Father   . Colon polyps Brother     x 2  . Hypertension Brother   . Colon polyps Sister   . Coronary artery disease      aunt  . Diabetes Mother   . Hypertension Mother   . Diabetes Sister   . Hypertension Brother   . Heart disease Maternal Grandmother   . Diabetes Maternal Grandmother   . Breast cancer Neg Hx    Review of Systems In general feels well. No chest pain or shortness of breath No  nausea, vomiting, blood in the stools.  Occasionally has diarrhea postprandial but denies abdominal pain, early satiety, heartburn. No dysuria gross hematuria. Complaining of his left foot skin is dry     Objective:   Physical Exam BP 132/82  Pulse 70  Ht 5\' 5"  (1.651 m)  Wt 272 lb (123.378 kg)  BMI 45.26 kg/m2  SpO2 98%  General -- alert, well-developed, nad    Neck --no thyromegaly Lungs -- normal respiratory effort, no intercostal retractions, no accessory muscle use, and normal breath sounds.   Heart-- normal rate, regular rhythm, no murmur, and no gallop.   Abdomen--soft, non-tender, no distention, no masses, no HSM, no guarding, and no rigidity.   Extremities-- no pretibial edema bilaterally. Feet normal, slt dry skin@ L heel.  Neurologic-- alert & oriented X3 and strength normal in all extremities. Psych-- Cognition and judgment appear intact. Alert and cooperative with normal attention span and concentration.  not anxious appearing and not depressed appearing.       Assessment & Plan:

## 2012-10-04 NOTE — Assessment & Plan Note (Addendum)
Tdap 2013 Cscope 03-2010, 2 adenomatous polyps, next due 03-2013, Dr Leone Payor , pt aware Sees gyn, due for a visit, due for a MMG---> pt aware We discussed about diet and exercise. Labs are reviewed, history of anemia without iron deficiency, serum protein was slightly low at some point.  Occasional postprandial diarrhea and no upper abdominal pain or early satiety. Recommend to do a diary and see if there is any particular food that affects her.

## 2012-10-04 NOTE — Patient Instructions (Addendum)
You are due for a colonoscopy in December 2014. You are due to see the gynecologist, please call them.

## 2012-10-12 ENCOUNTER — Telehealth: Payer: Self-pay | Admitting: *Deleted

## 2012-10-12 NOTE — Telephone Encounter (Signed)
Message copied by Nada Maclachlan on Thu Oct 12, 2012  4:39 PM ------      Message from: Wanda Plump      Created: Sat Oct 07, 2012  5:36 PM      Regarding: Anemia       Please call patient, has very mild  anemia, ask  about her perios: heavy?  how often? ------

## 2012-10-12 NOTE — Telephone Encounter (Signed)
Pt states she was on her cycle the day she was here. She is having a period every 45 days, they last about 5 days, & are extremely heavy the first 1-2 days.

## 2012-10-13 MED ORDER — FERROUS SULFATE 325 (65 FE) MG PO TABS: 325.0000 mg | ORAL_TABLET | Freq: Two times a day (BID) | ORAL | Status: DC

## 2012-10-13 NOTE — Telephone Encounter (Signed)
Discussed with pt, sent rx to pharmacy.  

## 2012-10-13 NOTE — Telephone Encounter (Signed)
Advise patient:  Cholesterol, liver, kidney, potassium and thyroid tests are normal. Mild iron deficiency anemia likely related to her periods. Recommend FeSO4 325 mg 1 po bid call #[phone], 3 RF. One multivitamin daily Discuss further with gynecology

## 2012-12-15 ENCOUNTER — Emergency Department (INDEPENDENT_AMBULATORY_CARE_PROVIDER_SITE_OTHER)
Admission: EM | Admit: 2012-12-15 | Discharge: 2012-12-15 | Disposition: A | Discharge status: Home / Self Care | Payer: Managed Care, Other (non HMO) | Source: Home / Self Care | Attending: Family Medicine | Admitting: Family Medicine

## 2012-12-15 ENCOUNTER — Encounter: Payer: Self-pay | Admitting: *Deleted

## 2012-12-15 DIAGNOSIS — J029 Acute pharyngitis, unspecified: Secondary | ICD-10-CM

## 2012-12-15 MED ORDER — AMOXICILLIN 875 MG PO TABS: 875.0000 mg | ORAL_TABLET | Freq: Two times a day (BID) | ORAL | Status: DC

## 2012-12-15 NOTE — ED Provider Notes (Signed)
CSN: [id number]     Arrival date & time 12/15/12  1431 History     First MD Initiated Contact with Patient 12/15/12 1441     Chief Complaint  Patient presents with  . Sore Throat    HPI  SORE THROAT  Onset: 3 days  Description: sore throat, fever, generalized malaise  Modifying factors: currently breast feeding   Symptoms  Fever:  Yes; tmax 100.8  URI symptoms: minimal  Cough: no Headache: no Rash:  no Swollen glands:   yes Recent Strep Exposure: no LUQ pain: no Heartburn/brash: no Allergy Symptoms: no  Red Flags STD exposure: no Breathing difficulty: no Drooling: no Trismus: no   Past Medical History  Diagnosis Date  . H/O chlamydia infection 04/07/93  . Obesity   . AMA (advanced maternal age) multigravida 35+   . H/O infertility 2004  . H/O polydactyly   . Irregular periods/menstrual cycles   . Uterine fibroid 10/1999  . Monilial vaginitis 12/2001  . Hx of hyperprolactinemia 08/2002  . Microadenoma 08/2010  . Abnormal Pap smear 1995  . 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 H/O candidiasis   . Anemia   . Blood transfusion 2006    [facility]  . Heartburn in pregnancy   . Hypertension    Past Surgical History  Procedure Laterality Date  . Cesarean section    . Myomectomy  07/2003  . Uterine fibroid surgery  2005    myomectomy x 1  . Tubal ligation     Family History  Problem Relation Age of Onset  . Colon cancer Father 86    colon  . Hypertension Father   . Colon polyps Brother     x 2  . Hypertension Brother   . Colon polyps Sister   . Coronary artery disease      aunt  . Diabetes Mother   . Hypertension Mother   . Diabetes Sister   . Hypertension Brother   . Heart disease Maternal Grandmother   . Diabetes Maternal Grandmother   . Breast cancer Neg Hx    History  Substance Use Topics  . Smoking status: Never Smoker   . Smokeless tobacco: Never Used  . Alcohol Use: Yes     Comment: socially   OB History   Grav Para Term Preterm Abortions TAB  SAB Ect Mult Living   2 2 1       2      Review of Systems  All other systems reviewed and are negative.    Allergies  Review of patient's allergies indicates no known allergies.  Home Medications   Current Outpatient Rx  Name  Route  Sig  Dispense  Refill  . amoxicillin (AMOXIL) 875 MG tablet   Oral   Take 1 tablet (875 mg total) by mouth 2 (two) times daily.   20 tablet   0   . ferrous sulfate 325 (65 FE) MG tablet   Oral   Take 1 tablet (325 mg total) by mouth 2 (two) times daily.   60 tablet   3    BP 150/85  Pulse 78  Temp(Src) 97.7 F (36.5 C) (Oral)  Resp 16  Ht 5\' 5"  (1.651 m)  Wt 277 lb (125.646 kg)  BMI 46.1 kg/m2  SpO2 99% Physical Exam  Constitutional:  Obese    HENT:  Head: Normocephalic and atraumatic.  Mouth/Throat: Oropharyngeal exudate present.  Eyes: Conjunctivae are normal. Pupils are equal, round, and reactive to light.  Neck: Normal  range of motion.  Cardiovascular: Normal rate and regular rhythm.   Pulmonary/Chest: Effort normal and breath sounds normal.  Musculoskeletal: Normal range of motion.  Lymphadenopathy:    She has cervical adenopathy.  Neurological: She is alert.  Skin: Skin is warm.    ED Course   Procedures (including critical care time)  Labs Reviewed  POCT RAPID STREP A (OFFICE)   No results found. 1. Acute pharyngitis     MDM  Centor score 3-4/4  Will empirically treat with amox.  Cat B for breastfeeding.  Discussed infectious and ENT red flags.  Follow up as needed.    The patient and/or caregiver has been counseled thoroughly with regard to treatment plan and/or medications prescribed including dosage, schedule, interactions, rationale for use, and possible side effects and they verbalize understanding. Diagnoses and expected course of recovery discussed and will return if not improved as expected or if the condition worsens. Patient and/or caregiver verbalized understanding.       Doree Albee,  MD 12/15/12 1459

## 2012-12-15 NOTE — ED Notes (Signed)
Felicia Monroe c/o sore throat with unusual taste in her mouth x 3 days. Low grade fever on Wednesday.

## 2013-03-08 ENCOUNTER — Emergency Department (INDEPENDENT_AMBULATORY_CARE_PROVIDER_SITE_OTHER)
Admission: EM | Admit: 2013-03-08 | Discharge: 2013-03-08 | Disposition: A | Discharge status: Home / Self Care | Payer: Managed Care, Other (non HMO) | Source: Home / Self Care | Attending: Emergency Medicine | Admitting: Emergency Medicine

## 2013-03-08 ENCOUNTER — Encounter: Payer: Self-pay | Admitting: Emergency Medicine

## 2013-03-08 DIAGNOSIS — J0100 Acute maxillary sinusitis, unspecified: Secondary | ICD-10-CM

## 2013-03-08 MED ORDER — AMOXICILLIN 875 MG PO TABS: 875.0000 mg | ORAL_TABLET | Freq: Two times a day (BID) | ORAL | Status: DC

## 2013-03-08 MED ORDER — PROMETHAZINE-CODEINE 6.25-10 MG/5ML PO SYRP: ORAL_SOLUTION | ORAL | Status: DC

## 2013-03-08 NOTE — ED Notes (Signed)
Dry cough, thick mucus x 1 week

## 2013-03-08 NOTE — ED Provider Notes (Signed)
CSN: [id number]     Arrival date & time 03/08/13  0902 History   First MD Initiated Contact with Patient 03/08/13 [phone]     Chief Complaint  Patient presents with  . Cough   (Consider location/radiation/quality/duration/timing/severity/associated sxs/prior Treatment) HPI URI HISTORY  Felicia Monroe is a 42 y.o. female who complains of onset of cold symptoms for 7 days.  Have been using over-the-counter treatment which helps a little bit. Has mostly dry cough with thick nasal mucus  No chills/sweats +  Fever  +  Nasal congestion +  Discolored Post-nasal drainage Mild sinus pain/pressure No sore throat  +  cough, hacking at times, worse at night.--No actual sputum from the chest No wheezing No chest congestion No hemoptysis No shortness of breath No pleuritic pain  No itchy/red eyes No earache  No nausea No vomiting No abdominal pain No diarrhea  No skin rashes +  Fatigue No myalgias No headache   She denies chance of pregnancy.--Last menstrual period was normal about 2 weeks ago Past Medical History  Diagnosis Date  . H/O chlamydia infection 04/07/93  . Obesity   . AMA (advanced maternal age) multigravida 35+   . H/O infertility 2004  . H/O polydactyly   . Irregular periods/menstrual cycles   . Uterine fibroid 10/1999  . Monilial vaginitis 12/2001  . Hx of hyperprolactinemia 08/2002  . Microadenoma 08/2010  . Abnormal Pap smear 1995  . 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 H/O candidiasis   . Anemia   . Blood transfusion 2006    [facility]  . Heartburn in pregnancy   . Hypertension    Past Surgical History  Procedure Laterality Date  . Cesarean section    . Myomectomy  07/2003  . Uterine fibroid surgery  2005    myomectomy x 1  . Tubal ligation     Family History  Problem Relation Age of Onset  . Colon cancer Father 68    colon  . Hypertension Father   . Colon polyps Brother     x 2  . Hypertension Brother   . Colon polyps Sister   . Coronary artery disease       aunt  . Diabetes Mother   . Hypertension Mother   . Diabetes Sister   . Hypertension Brother   . Heart disease Maternal Grandmother   . Diabetes Maternal Grandmother   . Breast cancer Neg Hx    History  Substance Use Topics  . Smoking status: Never Smoker   . Smokeless tobacco: Never Used  . Alcohol Use: Yes     Comment: socially   OB History   Grav Para Term Preterm Abortions TAB SAB Ect Mult Living   2 2 1       2      Review of Systems  All other systems reviewed and are negative.    Allergies  Review of patient's allergies indicates not on file.  Home Medications   Current Outpatient Rx  Name  Route  Sig  Dispense  Refill  . dextromethorphan (DELSYM) 30 MG/5ML liquid   Oral   Take by mouth as needed for cough.         . dextromethorphan-guaiFENesin (MUCINEX DM) 30-600 MG per 12 hr tablet   Oral   Take 1 tablet by mouth 2 (two) times daily.         Marland Kitchen amoxicillin (AMOXIL) 875 MG tablet   Oral   Take 1 tablet (875 mg total) by mouth 2 (two)  times daily. Take for 10 days.   20 tablet   0   . ferrous sulfate 325 (65 FE) MG tablet   Oral   Take 1 tablet (325 mg total) by mouth 2 (two) times daily.   60 tablet   3   . promethazine-codeine (PHENERGAN WITH CODEINE) 6.25-10 MG/5ML syrup      Take 1-2 teaspoons every 4-6 hours as needed for cough. May cause drowsiness.   120 mL   0    BP 154/80  Pulse 84  Temp(Src) 97.4 F (36.3 C) (Oral)  Ht 5\' 5"  (1.651 m)  Wt 282 lb (127.914 kg)  BMI 46.93 kg/m2  SpO2 97% Physical Exam  Nursing note and vitals reviewed. Constitutional: She is oriented to person, place, and time. She appears well-developed and well-nourished. No distress.  HENT:  Head: Normocephalic and atraumatic.  Right Ear: Tympanic membrane, external ear and ear canal normal.  Left Ear: Tympanic membrane, external ear and ear canal normal.  Nose: Mucosal edema and rhinorrhea present. Right sinus exhibits maxillary sinus tenderness.  Left sinus exhibits maxillary sinus tenderness.  Mouth/Throat: Oropharynx is clear and moist. No oral lesions. No oropharyngeal exudate.  Eyes: Right eye exhibits no discharge. Left eye exhibits no discharge. No scleral icterus.  Neck: Neck supple.  Cardiovascular: Normal rate, regular rhythm and normal heart sounds.   Pulmonary/Chest: Effort normal and breath sounds normal. She has no wheezes. She has no rales.  Lymphadenopathy:    She has no cervical adenopathy.  Neurological: She is alert and oriented to person, place, and time.  Skin: Skin is warm and dry.    ED Course  Procedures (including critical care time) Labs Review Labs Reviewed - No data to display Imaging Review No results found.  EKG Interpretation     Ventricular Rate:    PR Interval:    QRS Duration:   QT Interval:    QTC Calculation:   R Axis:     Text Interpretation:              MDM   1. Acute maxillary sinusitis    Treatment options discussed. She had taken one leftover amoxicillin this morning. After risks, benefits, alternatives discussed, prescribed amoxicillin 875 twice a day x10 days. OTC Mucinex D every morning Phenergan With Codeine 1 or 2 teaspoons every 4-6 hours when necessary cough, but precautions discussed. Precautions discussed. Red flags discussed. Questions invited and answered. Patient voiced understanding and agreement.    Lajean Manes, MD 03/08/13 [phone]

## 2013-03-16 ENCOUNTER — Encounter: Payer: Self-pay | Admitting: Emergency Medicine

## 2013-03-16 ENCOUNTER — Emergency Department (INDEPENDENT_AMBULATORY_CARE_PROVIDER_SITE_OTHER)
Admission: EM | Admit: 2013-03-16 | Discharge: 2013-03-16 | Disposition: A | Discharge status: Home / Self Care | Payer: Managed Care, Other (non HMO) | Source: Home / Self Care | Attending: Emergency Medicine | Admitting: Emergency Medicine

## 2013-03-16 DIAGNOSIS — J4541 Moderate persistent asthma with (acute) exacerbation: Secondary | ICD-10-CM

## 2013-03-16 DIAGNOSIS — J45901 Unspecified asthma with (acute) exacerbation: Secondary | ICD-10-CM

## 2013-03-16 MED ORDER — CLARITHROMYCIN 500 MG PO TABS: ORAL_TABLET | ORAL | Status: DC

## 2013-03-16 MED ORDER — PREDNISONE (PAK) 10 MG PO TABS: ORAL_TABLET | ORAL | Status: DC

## 2013-03-16 MED ORDER — HYDROCOD POLST-CHLORPHEN POLST 10-8 MG/5ML PO LQCR: 5.0000 mL | Freq: Every evening | ORAL | Status: DC | PRN

## 2013-03-16 MED ORDER — BENZONATATE 100 MG PO CAPS: 100.0000 mg | ORAL_CAPSULE | Freq: Three times a day (TID) | ORAL | Status: DC

## 2013-03-16 NOTE — ED Provider Notes (Signed)
CSN: [id number]     Arrival date & time 03/16/13  1926 History   First MD Initiated Contact with Patient 03/16/13 1958     Chief Complaint  Patient presents with  . Cough   (Consider location/radiation/quality/duration/timing/severity/associated sxs/prior Treatment) HPI URI HISTORY  Delsy is a 42 y.o. female who complains of onset of persistent cough and cold symptoms for 2 weeks. She was seen here by me in urgent care one week ago for URI/sinusitis, prescribed amoxicillin, which he feels has not resolved the symptoms. She feels she is actually worse and feels the infection and congestion has moved down into her chest. Denies fever or chills. She feels like she has some wheezing at times. No history of asthma  Review of systems No chills/sweats No Fever  Minimal  Nasal congestion No Discolored Post-nasal drainage No sinus pain/pressure No sore throat  +  Cough, hacking at times. Day or night. The cough keeps her up at night Cough occasionally productive of yellow sputum. Mild wheezing at times. + chest congestion No hemoptysis No shortness of breath No pleuritic pain  No itchy/red eyes No earache  No nausea No vomiting No abdominal pain No diarrhea  No skin rashes +  Fatigue No myalgias No headache   She denies past medical history of asthma  Past Medical History  Diagnosis Date  . H/O chlamydia infection 04/07/93  . Obesity   . AMA (advanced maternal age) multigravida 35+   . H/O infertility 2004  . H/O polydactyly   . Irregular periods/menstrual cycles   . Uterine fibroid 10/1999  . Monilial vaginitis 12/2001  . Hx of hyperprolactinemia 08/2002  . Microadenoma 08/2010  . Abnormal Pap smear 1995  . 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 H/O candidiasis   . Anemia   . Blood transfusion 2006    [facility]  . Heartburn in pregnancy   . Hypertension    Past Surgical History  Procedure Laterality Date  . Cesarean section    . Myomectomy  07/2003  . Uterine  fibroid surgery  2005    myomectomy x 1  . Tubal ligation     Family History  Problem Relation Age of Onset  . Colon cancer Father 14    colon  . Hypertension Father   . Colon polyps Brother     x 2  . Hypertension Brother   . Colon polyps Sister   . Coronary artery disease      aunt  . Diabetes Mother   . Hypertension Mother   . Diabetes Sister   . Hypertension Brother   . Heart disease Maternal Grandmother   . Diabetes Maternal Grandmother   . Breast cancer Neg Hx    History  Substance Use Topics  . Smoking status: Never Smoker   . Smokeless tobacco: Never Used  . Alcohol Use: Yes     Comment: socially   OB History   Grav Para Term Preterm Abortions TAB SAB Ect Mult Living   2 2 1       2      Review of Systems  All other systems reviewed and are negative.    Allergies  Review of patient's allergies indicates no known allergies.  Home Medications   Current Outpatient Rx  Name  Route  Sig  Dispense  Refill  . amoxicillin (AMOXIL) 875 MG tablet   Oral   Take 1 tablet (875 mg total) by mouth 2 (two) times daily. Take for 10 days.   20  tablet   0   . benzonatate (TESSALON) 100 MG capsule   Oral   Take 1 capsule (100 mg total) by mouth every 8 (eight) hours. As needed for cough   21 capsule   0   . chlorpheniramine-HYDROcodone (TUSSIONEX PENNKINETIC ER) 10-8 MG/5ML LQCR   Oral   Take 5 mLs by mouth at bedtime as needed for cough. For cough.   115 mL   0   . clarithromycin (BIAXIN) 500 MG tablet      Take 1 twice a day for 10 days.   20 tablet   0   . dextromethorphan (DELSYM) 30 MG/5ML liquid   Oral   Take by mouth as needed for cough.         . dextromethorphan-guaiFENesin (MUCINEX DM) 30-600 MG per 12 hr tablet   Oral   Take 1 tablet by mouth 2 (two) times daily.         . ferrous sulfate 325 (65 FE) MG tablet   Oral   Take 1 tablet (325 mg total) by mouth 2 (two) times daily.   60 tablet   3   . predniSONE (STERAPRED UNI-PAK) 10  MG tablet      Take as directed for 6 days.--Take 6 on day 1, 5 on day 2, 4 on day 3, then 3 tablets on day 4, then 2 tablets on day 5, then 1 on day 6.   21 tablet   0   . promethazine-codeine (PHENERGAN WITH CODEINE) 6.25-10 MG/5ML syrup      Take 1-2 teaspoons every 4-6 hours as needed for cough. May cause drowsiness.   120 mL   0    BP 140/81  Pulse 104  Temp(Src) 98 F (36.7 C) (Oral)  Resp 18  Ht 5\' 5"  (1.651 m)  Wt 282 lb (127.914 kg)  BMI 46.93 kg/m2  SpO2 100% Physical Exam  Nursing note and vitals reviewed. Constitutional: She is oriented to person, place, and time. She appears well-developed and well-nourished. No distress.  Hacking cough noted  HENT:  Head: Normocephalic and atraumatic.  Right Ear: Tympanic membrane normal.  Left Ear: Tympanic membrane normal.  Nose: Nose normal.  Mouth/Throat: Oropharynx is clear and moist. No oropharyngeal exudate.  Eyes: Right eye exhibits no discharge. Left eye exhibits no discharge. No scleral icterus.  Neck: Neck supple.  Cardiovascular: Normal rate, regular rhythm and normal heart sounds.   Pulmonary/Chest: No accessory muscle usage. No respiratory distress. She has no decreased breath sounds. She has wheezes (mild, late expiratory, throughout). She has rhonchi (Diffusely). She has no rales.  Lymphadenopathy:    She has no cervical adenopathy.  Neurological: She is alert and oriented to person, place, and time.  Skin: Skin is warm and dry.   no cyanosis, clubbing, or edema or tenderness of extremities  ED Course  Procedures (including critical care time) Labs Review Labs Reviewed - No data to display Imaging Review No results found.  EKG Interpretation    Date/Time:    Ventricular Rate:    PR Interval:    QRS Duration:   QT Interval:    QTC Calculation:   R Axis:     Text Interpretation:              MDM   1. Acute asthmatic bronchitis, moderate persistent    We discussed a variety of workup  and treatment options. Risks, benefits, alternatives discussed. I offered chest x-ray, but she declined at this time. I advised  nebulizer bronchodilator treatment, but she declined. I advised injection of Depo-Medrol and injection of Rocephin, patient declined.  She agrees with the following plans: Biaxin 500 mg twice a day by mouth x10 days Prednisone 10 mg-6 day Dosepak Tessalon pearls, one by mouth q. 8 hours when necessary cough. Tussionex. 5 ML's at bedtime when necessary nighttime cough.--Precautions discussed. Advised followup with PCP or we can refer to pulmonologist if no better one week. Return here sooner or emergency room if worse or new symptoms. Precautions discussed. Red flags discussed. Questions invited and answered. Patient voiced understanding and agreement.    Lajean Manes, MD 03/16/13 2036

## 2013-03-16 NOTE — ED Notes (Addendum)
Pt c/o persistent productive cough x 3wks, worse at night. Denies fever. She also c/o unexplained bruising on the LT side of her neck x 4 days.

## 2013-03-24 ENCOUNTER — Encounter: Payer: Self-pay | Admitting: Internal Medicine

## 2013-03-24 DIAGNOSIS — Z800 Family history of malignant neoplasm of digestive organs: Secondary | ICD-10-CM

## 2013-03-24 HISTORY — DX: Family history of malignant neoplasm of digestive organs: Z80.0

## 2013-03-26 ENCOUNTER — Encounter: Payer: Self-pay | Admitting: Internal Medicine

## 2013-04-03 ENCOUNTER — Ambulatory Visit (HOSPITAL_BASED_OUTPATIENT_CLINIC_OR_DEPARTMENT_OTHER)
Admission: RE | Admit: 2013-04-03 | Discharge: 2013-04-03 | Disposition: A | Discharge status: Home / Self Care | Payer: Managed Care, Other (non HMO) | Source: Ambulatory Visit | Attending: Internal Medicine | Admitting: Internal Medicine

## 2013-04-03 ENCOUNTER — Ambulatory Visit (INDEPENDENT_AMBULATORY_CARE_PROVIDER_SITE_OTHER): Payer: Managed Care, Other (non HMO) | Admitting: Internal Medicine

## 2013-04-03 VITALS — BP 132/84 | HR 99 | Temp 97.6°F | Wt 284.0 lb

## 2013-04-03 DIAGNOSIS — R059 Cough, unspecified: Secondary | ICD-10-CM | POA: Insufficient documentation

## 2013-04-03 DIAGNOSIS — R05 Cough: Secondary | ICD-10-CM

## 2013-04-03 DIAGNOSIS — R21 Rash and other nonspecific skin eruption: Secondary | ICD-10-CM

## 2013-04-03 MED ORDER — HYDROCOD POLST-CHLORPHEN POLST 10-8 MG/5ML PO LQCR: 5.0000 mL | Freq: Every evening | ORAL | Status: DC | PRN

## 2013-04-03 MED ORDER — BUDESONIDE-FORMOTEROL FUMARATE 80-4.5 MCG/ACT IN AERO: 2.0000 | INHALATION_SPRAY | Freq: Two times a day (BID) | RESPIRATORY_TRACT | Status: DC

## 2013-04-03 MED ORDER — PREDNISONE 10 MG PO TABS: ORAL_TABLET | ORAL | Status: DC

## 2013-04-03 NOTE — Patient Instructions (Signed)
Symbicort 80/4.5 take two puffs twice a day For cough, take Mucinex DM twice a day as needed  For congestion use OTC Nasocort: 2 nasal sprays on each side of the nose daily   claritin 10 mg OTC 1 a day Prednisone x 5 days   Get the XR at [facility], corner of [address] (10 minutes form here); they are open 24/7 [address]  [place], [place] [address] [phone]    Next visit for a  follow up  regards cough , no fasting, in 4 weeks  Please make an appointment

## 2013-04-03 NOTE — Progress Notes (Signed)
   Subjective:    Patient ID: Felicia Monroe, female    DOB: 1971-03-03, 42 y.o.   MRN: [id number]  HPI Acute visit, chief complaint is cough. Went to the urgent 3 times, notes reviewed, see assessment and plan. Since the last time she went there on 03/16/2013 she continue with cough, is persisting, not necessarily nocturnal, mostly dry, sometimes has a small amount of sputum but she thinks is from postnasal dripping. Admits to chest "rateling" and rarely hears wheezing.  Also has a skin lesion in the neck  Past Medical History  Diagnosis Date  . H/O chlamydia infection 04/07/93  . Obesity   . AMA (advanced maternal age) multigravida 35+   . H/O infertility 2004  . H/O polydactyly   . Irregular periods/menstrual cycles   . Uterine fibroid 10/1999  . Monilial vaginitis 12/2001  . Hx of hyperprolactinemia 08/2002  . Microadenoma 08/2010  . Abnormal Pap smear 1995  . 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 H/O candidiasis   . Anemia   . Blood transfusion 2006    [facility]  . Heartburn in pregnancy   . Hypertension   . Personal history of colonic adenomas 04/21/2010   Past Surgical History  Procedure Laterality Date  . Cesarean section    . Myomectomy  07/2003  . Uterine fibroid surgery  2005    myomectomy x 1  . Tubal ligation     History  Substance Use Topics  . Smoking status: Never Smoker   . Smokeless tobacco: Never Used  . Alcohol Use: Yes     Comment: socially     Review of Systems Denies shortness or breath per se. No heartburn, indigestion. Denies  itchy eyes or nose. No sinus pain but she does have postnasal dripping. No fever or chills.     Objective:   Physical Exam BP 132/84  Pulse 99  Temp(Src) 97.6 F (36.4 C)  Wt 284 lb (128.822 kg)  SpO2 98% General -- alert, well-developed, NAD.  Neck --2x1 cm patch of hyperpigmentation, macular, at the L neck, no moles HEENT-- Not pale. TMs normal, throat symmetric, no redness or discharge. Face symmetric, sinuses  not tender to palpation. Nose  congested.  Lungs -- normal respiratory effort, no intercostal retractions, no accessory muscle use.Minimal rhonchi bilaterally,  Slightly prolonged expiration time. Frequent cough noted. Heart-- normal rate, regular rhythm, no murmur.  Extremities-- no pretibial edema bilaterally  Neurologic--  alert & oriented X3. Speech normal, gait normal, strength normal in all extremities.  Psych-- Cognition and judgment appear intact. Cooperative with normal attention span and concentration. No anxious appearing , no depressed appearing.      Assessment & Plan:  Cough, Went to the urgent 3 times lately, notes reviewed, 03-08-13 dx w/ sinusitis, rx amox,   03/16/2013 was dx with asthmatic bronchitis, the doctor over there heared some increasing. She was treated with prednisone, cough suppressants, Biaxin. Cough is probably multifactorial including postnasal dripping, reactive airway disease. Pt is not breast feeding anymore  Plan:  X-ray, Nasal steroids, Claritin, steroids x  5 days Tussionex as needed  Skin lesions in the neck, looks like postinflammatory hyperpigmentation, recommend observation. If she continued to be concerned we can refer her to dermatology. Declined at this time

## 2013-04-03 NOTE — Progress Notes (Signed)
Pre visit review using our clinic review tool, if applicable. No additional management support is needed unless otherwise documented below in the visit note. 

## 2013-04-23 ENCOUNTER — Emergency Department (INDEPENDENT_AMBULATORY_CARE_PROVIDER_SITE_OTHER): Payer: Managed Care, Other (non HMO)

## 2013-04-23 ENCOUNTER — Encounter: Payer: Self-pay | Admitting: Emergency Medicine

## 2013-04-23 ENCOUNTER — Telehealth: Payer: Self-pay | Admitting: *Deleted

## 2013-04-23 ENCOUNTER — Emergency Department (INDEPENDENT_AMBULATORY_CARE_PROVIDER_SITE_OTHER)
Admission: EM | Admit: 2013-04-23 | Discharge: 2013-04-23 | Disposition: A | Discharge status: Home / Self Care | Payer: Managed Care, Other (non HMO) | Source: Home / Self Care | Attending: Family Medicine | Admitting: Family Medicine

## 2013-04-23 DIAGNOSIS — R05 Cough: Secondary | ICD-10-CM

## 2013-04-23 DIAGNOSIS — R0781 Pleurodynia: Secondary | ICD-10-CM

## 2013-04-23 DIAGNOSIS — R079 Chest pain, unspecified: Secondary | ICD-10-CM

## 2013-04-23 LAB — POCT CBC W AUTO DIFF (K'VILLE URGENT CARE)

## 2013-04-23 MED ORDER — METAXALONE 800 MG PO TABS: 800.0000 mg | ORAL_TABLET | Freq: Three times a day (TID) | ORAL | Status: DC

## 2013-04-23 NOTE — ED Provider Notes (Signed)
CSN: [id number]     Arrival date & time 04/23/13  1156 History   First MD Initiated Contact with Patient 04/23/13 1308     Chief Complaint  Patient presents with  . Chest Pain    right rib/under breast      HPI Comments: Patient complains of onset of a non-productive cough and pleuritic pain underneath her right breast about 5 days ago.  She has had an intermittent cough for the past 1.5 months, treated with two courses of antibiotics (amoxicillin, Biaxin) and prednisone.  She feels well otherwise without fevers, chills, and sweats, and no shortness of breath.  A chest X-ray done 04/03/13 was negative.  The history is provided by the patient.    Past Medical History  Diagnosis Date  . H/O chlamydia infection 04/07/93  . Obesity   . AMA (advanced maternal age) multigravida 35+   . H/O infertility 2004  . H/O polydactyly   . Irregular periods/menstrual cycles   . Uterine fibroid 10/1999  . Monilial vaginitis 12/2001  . Hx of hyperprolactinemia 08/2002  . Microadenoma 08/2010  . Abnormal Pap smear 1995  . 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 H/O candidiasis   . Anemia   . Blood transfusion 2006    [facility]  . Heartburn in pregnancy   . Hypertension   . Personal history of colonic adenomas 04/21/2010   Past Surgical History  Procedure Laterality Date  . Cesarean section    . Myomectomy  07/2003  . Uterine fibroid surgery  2005    myomectomy x 1  . Tubal ligation     Family History  Problem Relation Age of Onset  . Colon cancer Father 69    colon  . Hypertension Father   . Colon polyps Brother     x 2  . Hypertension Brother   . Colon polyps Sister   . Coronary artery disease      aunt  . Diabetes Mother   . Hypertension Mother   . Diabetes Sister   . Hypertension Brother   . Heart disease Maternal Grandmother   . Diabetes Maternal Grandmother   . Breast cancer Neg Hx    History  Substance Use Topics  . Smoking status: Never Smoker   . Smokeless tobacco: Never Used  .  Alcohol Use: Yes     Comment: socially   OB History   Grav Para Term Preterm Abortions TAB SAB Ect Mult Living   2 2 1       2      Review of Systems No sore throat + cough + pleuritic pain No wheezing No nasal congestion No post-nasal drainage No sinus pain/pressure No itchy/red eyes No earache No hemoptysis No SOB No fever/chills No nausea No vomiting No abdominal pain + diarrhea, resolved No urinary symptoms No skin rash No fatigue No myalgias No headache Used OTC meds without relief  Allergies  Review of patient's allergies indicates no known allergies.  Home Medications   Current Outpatient Rx  Name  Route  Sig  Dispense  Refill  . budesonide-formoterol (SYMBICORT) 80-4.5 MCG/ACT inhaler   Inhalation   Inhale 2 puffs into the lungs 2 (two) times daily.   1 Inhaler   3   . chlorpheniramine-HYDROcodone (TUSSIONEX PENNKINETIC ER) 10-8 MG/5ML LQCR   Oral   Take 5 mLs by mouth at bedtime as needed for cough. For cough.   115 mL   0   . ferrous sulfate 325 (65 FE) MG tablet  Oral   Take 1 tablet (325 mg total) by mouth 2 (two) times daily.   60 tablet   3   . metaxalone (SKELAXIN) 800 MG tablet   Oral   Take 1 tablet (800 mg total) by mouth 3 (three) times daily.   21 tablet   1    BP 164/84  Pulse 88  Temp(Src) 97.8 F (36.6 C) (Oral)  Resp 16  Wt 279 lb (126.554 kg)  SpO2 100%  LMP 03/10/2013 Physical Exam  Nursing note and vitals reviewed. Constitutional: She is oriented to person, place, and time. She appears well-developed and well-nourished. No distress.  Patient is obese.  HENT:  Head: Normocephalic.  Right Ear: Tympanic membrane normal.  Left Ear: Tympanic membrane normal.  Nose: Nose normal.  Mouth/Throat: Oropharynx is clear and moist.  Eyes: Conjunctivae are normal. Pupils are equal, round, and reactive to light.  Neck: Neck supple.  Cardiovascular: Normal rate, regular rhythm and normal heart sounds.   Pulmonary/Chest:  Effort normal.    Right anterior chest has tenderness to palpation extending to sternum as noted on diagram  Abdominal: Soft. There is no tenderness.  Musculoskeletal: She exhibits no edema and no tenderness.  Lymphadenopathy:    She has no cervical adenopathy.  Neurological: She is alert and oriented to person, place, and time.  Skin: Skin is warm. No rash noted.    ED Course  Procedures  None    Labs Reviewed  POCT CBC W AUTO DIFF ([facility])  WBC 6.0; LY 38.1; MO 7.3; GR 54.6; Hgb 11.3; Platelets 237    Imaging Review Dg Ribs Unilateral W/chest Right  04/23/2013   CLINICAL DATA:  Cough for 2 months with right-sided chest pain  EXAM: RIGHT RIBS AND CHEST - 3+ VIEW  COMPARISON:  Prior radiograph from 04/03/2013  FINDINGS: No fracture or other bone lesions are seen involving the ribs. There is no evidence of pneumothorax or pleural effusion. Both lungs are clear. Heart size and mediastinal contours are within normal limits.  IMPRESSION: 1. No acute cardiopulmonary abnormality. 2. No rib fracture or other acute osseous abnormality identified.   Electronically Signed   By: Rise Mu M.D.   On: 04/23/2013 14:15      MDM   1. Rib pain on right side, secondary to cough. ?costochondritis, ?intercostal muscle strain    Rib belt applied.  Begin Skelaxin. Followup with Family Doctor if not improved in one week.     Lattie Haw, MD 04/26/13 [phone]

## 2013-04-23 NOTE — ED Notes (Signed)
Felicia Monroe c/o pain under left breast x 5 days. She's had a cough that has persisted. Pain is worse with movement and coughing.

## 2013-04-23 NOTE — Telephone Encounter (Signed)
Patient called and stated that since Christmas day, she has been having a pain on right side of chest behind breast. Patient stated that she has been congested and coughing for the last week. She denies shortness of breath, dizziness, and blurred vision. Patient was advised to be seen at a [facility] urgent care since we have no openings here. She stated she would rather do that then see a provider at a different location. JG//CMA

## 2013-05-04 ENCOUNTER — Ambulatory Visit (INDEPENDENT_AMBULATORY_CARE_PROVIDER_SITE_OTHER): Payer: Managed Care, Other (non HMO) | Admitting: Internal Medicine

## 2013-05-04 ENCOUNTER — Encounter: Payer: Self-pay | Admitting: Internal Medicine

## 2013-05-04 VITALS — BP 155/77 | HR 90 | Temp 98.5°F | Wt 287.0 lb

## 2013-05-04 DIAGNOSIS — R05 Cough: Secondary | ICD-10-CM

## 2013-05-04 DIAGNOSIS — R059 Cough, unspecified: Secondary | ICD-10-CM

## 2013-05-04 DIAGNOSIS — I10 Essential (primary) hypertension: Secondary | ICD-10-CM

## 2013-05-04 NOTE — Progress Notes (Signed)
   Subjective:    Patient ID: Felicia Monroe, female    DOB: 12/23/1970, 43 y.o.   MRN: [id number]  HPI Here for a ROV,we discussed the following issues: Cough, she took the medications as prescribed and is doing much better. About 2 weeks ago she developed pain in the right lateral chest wall, mostly with cough or lying down in certain positions. Went to a urgent care, x-rays did not show any fractures, was prescribed muscle relaxant, She is gradually getting better.   Past Medical History  Diagnosis Date  . H/O chlamydia infection 04/07/93  . Obesity   . AMA (advanced maternal age) multigravida 58+   . H/O infertility 2004  . H/O polydactyly   . Irregular periods/menstrual cycles   . Uterine fibroid 10/1999  . Monilial vaginitis 12/2001  . Hx of hyperprolactinemia 08/2002  . Microadenoma 08/2010  . Abnormal Pap smear 1995  . 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 H/O candidiasis   . Anemia   . Blood transfusion 2006    [place]  . Heartburn in pregnancy   . Hypertension   . Personal history of colonic adenomas 04/21/2010   Past Surgical History  Procedure Laterality Date  . Cesarean section    . Myomectomy  07/2003  . Uterine fibroid surgery  2005    myomectomy x 1  . Tubal ligation      Review of Systems  Denies postprandial abdominal pain, appetite is normal. No fever or chills. No nausea vomiting. No rash in the chest     Objective:   Physical Exam BP 155/77  Pulse 90  Temp(Src) 98.5 F (36.9 C)  Wt 287 lb (130.182 kg)  SpO2 100%  LMP 03/10/2013 General -- alert, well-developed, NAD.  Lungs -- normal respiratory effort, no intercostal retractions, no accessory muscle use, and normal breath sounds.  Chest wall-- no TTP Heart-- normal rate, regular rhythm, no murmur.  Abdomen-- Not distended, good bowel sounds,soft, non-tender.  Extremities-- no pretibial edema bilaterally  Neurologic--  alert & oriented X3. Speech normal, gait normal, strength normal in all  extremities.   Psych-- Cognition and judgment appear intact. Cooperative with normal attention span and concentration. No anxious or depressed appearing.     Assessment & Plan:  Cough---improved, recommend to take Mucinex DM, Nasacort and Claritin as needed. She stopped Symbicort 2 days ago, if sx resurface she may need to keep taking Symbicort.  Chest pain--symptoms consistent with a musculoskeletal issue, recommend observation, if symptoms persist may need a gallbladder ultrasound.  Hypertension--BP slightly elevated today, recommend monitoring, see instructions

## 2013-05-04 NOTE — Progress Notes (Signed)
Pre visit review using our clinic review tool, if applicable. No additional management support is needed unless otherwise documented below in the visit note. 

## 2013-05-04 NOTE — Patient Instructions (Addendum)
For cough, take Mucinex DM twice a day as, OTC Nasocort: 2 nasal sprays on each side of the nose daily  and claritin 10 mg OTC 1 a day as needed  Check the  blood pressure 2 or 3 times a  week be sure it is between 110/60 and 140/85. Ideal blood pressure is 120/80. If it is consistently higher or lower, let me know   Next visit is for a physical exam in 6 months,  fasting Please make an appointment

## 2013-05-06 ENCOUNTER — Encounter: Payer: Self-pay | Admitting: Internal Medicine

## 2013-05-16 ENCOUNTER — Other Ambulatory Visit: Payer: Self-pay | Admitting: *Deleted

## 2013-05-16 ENCOUNTER — Telehealth: Payer: Self-pay | Admitting: *Deleted

## 2013-05-16 MED ORDER — OSELTAMIVIR PHOSPHATE 75 MG PO CAPS: 75.0000 mg | ORAL_CAPSULE | Freq: Every day | ORAL | Status: DC

## 2013-05-16 NOTE — Telephone Encounter (Signed)
Script sent to pharmacy. JG//CMA

## 2013-05-16 NOTE — Telephone Encounter (Signed)
Tamiflu 75 mg one by mouth daily, #10, no refills. Try to protect herself, frequent handwashing, ask daughter to cough and cover, etc

## 2013-05-16 NOTE — Telephone Encounter (Signed)
Patient called and stated that her daughter has the flu and she would like to know what preventive measures she should take. She would like to know if Tamiflu would be appropriate. Please advise. JG//CMA

## 2013-07-02 ENCOUNTER — Telehealth: Payer: Self-pay

## 2013-07-02 ENCOUNTER — Encounter: Payer: Self-pay | Admitting: Internal Medicine

## 2013-07-02 ENCOUNTER — Ambulatory Visit (INDEPENDENT_AMBULATORY_CARE_PROVIDER_SITE_OTHER): Payer: Managed Care, Other (non HMO) | Admitting: Physician Assistant

## 2013-07-02 ENCOUNTER — Encounter: Payer: Self-pay | Admitting: Physician Assistant

## 2013-07-02 VITALS — BP 158/85 | HR 88 | Temp 97.9°F | Resp 18 | Ht 65.0 in | Wt 284.1 lb

## 2013-07-02 DIAGNOSIS — R002 Palpitations: Secondary | ICD-10-CM

## 2013-07-02 DIAGNOSIS — I10 Essential (primary) hypertension: Secondary | ICD-10-CM

## 2013-07-02 DIAGNOSIS — K219 Gastro-esophageal reflux disease without esophagitis: Secondary | ICD-10-CM

## 2013-07-02 MED ORDER — HYDROCHLOROTHIAZIDE 12.5 MG PO TABS: 12.5000 mg | ORAL_TABLET | Freq: Every day | ORAL | Status: DC

## 2013-07-02 NOTE — Progress Notes (Signed)
Patient presents to clinic today c/o a few days of an intermittent fluttering sensation in her upper chest.  Sensation has been present 3-4 times this past week.  Denies hx of similar sensation.  Denies chest pain, shortness of breath, headache, lightheadedness or dizziness with flutter.  Patient does say she notices some reflux and globus after meals. Endorses relief of flutter with belching. sS not currently taking anything for acid reflux.  Patient is obese and has a sedentary lifestyle.  Patient has PMH of hypertension but is not being prescribed anything for this as she states "My doctor said it was from my weight and I should lose weight".  Patient denies major change in vision.    Past Medical History  Diagnosis Date  . H/O chlamydia infection 04/07/93  . Obesity   . AMA (advanced maternal age) multigravida 22+   . H/O infertility 2004  . H/O polydactyly   . Irregular periods/menstrual cycles   . Uterine fibroid 10/1999  . Monilial vaginitis 12/2001  . Hx of hyperprolactinemia 08/2002  . Microadenoma 08/2010  . Abnormal Pap smear 1995  . 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 H/O candidiasis   . Anemia   . Blood transfusion 2006    [place]  . Heartburn in pregnancy   . Hypertension   . Personal history of colonic adenomas 04/21/2010    No current outpatient prescriptions on file prior to visit.   No current facility-administered medications on file prior to visit.    No Known Allergies  Family History  Problem Relation Age of Onset  . Colon cancer Father 44    colon  . Hypertension Father   . Colon polyps Brother     x 2  . Hypertension Brother   . Colon polyps Sister   . Coronary artery disease      aunt  . Diabetes Mother   . Hypertension Mother   . Diabetes Sister   . Hypertension Brother   . Heart disease Maternal Grandmother   . Diabetes Maternal Grandmother   . Breast cancer Neg Hx     History   Social History  . Marital Status: Married    Spouse Name: N/A   Number of Children: 2  . Years of Education: N/A   Occupational History  . [employer]   .     Social History Main Topics  . Smoking status: Never Smoker   . Smokeless tobacco: Never Used  . Alcohol Use: Yes     Comment: socially  . Drug Use: No  . Sexual Activity: Not Currently    Birth Control/ Protection: None     Comment: BTL   Other Topics Concern  . None   Social History Narrative  . None   Review of Systems - See HPI.  All other ROS are negative.  BP 158/85  Pulse 88  Temp(Src) 97.9 F (36.6 C) (Oral)  Resp 18  Ht 5\' 5"  (1.651 m)  Wt 284 lb 2 oz (128.878 kg)  BMI 47.28 kg/m2  SpO2 100%  LMP 06/27/2013  Physical Exam  Constitutional: She is oriented to person, place, and time and well-developed, well-nourished, and in no distress.  HENT:  Head: Normocephalic and atraumatic.  Right Ear: External ear normal.  Left Ear: External ear normal.  Nose: Nose normal.  Mouth/Throat: Oropharynx is clear and moist. No oropharyngeal exudate.  TM within normal limits bilaterally.  Eyes: Conjunctivae are normal. Pupils are equal, round, and reactive to light.  Neck:  Neck supple. No thyromegaly present.  Cardiovascular: Normal rate, regular rhythm, normal heart sounds and intact distal pulses.   No murmur heard. Pulmonary/Chest: Effort normal and breath sounds normal. No respiratory distress. She has no wheezes. She has no rales. She exhibits no tenderness.  Abdominal: Soft. Bowel sounds are normal. There is no tenderness.  Neurological: She is alert and oriented to person, place, and time.  Skin: Skin is warm and dry. No rash noted.  Psychiatric: Affect normal.   Recent Results (from the past 2160 hour(s))  POCT CBC W AUTO DIFF ([facility])     Status: None   Collection Time    04/23/13  2:51 PM      Result Value Ref Range   WBC    4.5 - 10.5 K/uL   Comment: see attached report   Lymphocytes relative %    15 - 45 %   Monocytes relative %    2 - 10 %    Neutrophils relative % (GR)    44 - 76 %   Lymphocytes absolute    0.1 - 1.8 K/uL   Monocyes absolute    0.1 - 1 K/uL   Neutrophils absolute (GR#)    1.7 - 7.8 K/uL   RBC    3.8 - 5.1 MIL/uL   Hemoglobin    11.8 - 15.5 g/dL   Hematocrit    34.8 - 46 %   MCV    78 - 100 fL   MCH    26 - 32 pg   MCHC    32 - 36.5 g/dL   RDW    11.6 - 14 %   Platelet count    140 - 400 K/uL   MPV    7.8 - 11 fL  CBC WITH DIFFERENTIAL     Status: None   Collection Time    07/02/13  5:00 PM      Result Value Ref Range   WBC 6.3  4.5 - 10.5 K/uL   RBC 4.31  3.87 - 5.11 Mil/uL   Hemoglobin 12.2  12.0 - 15.0 g/dL   HCT 36.0  36.0 - 46.0 %   MCV 83.6  78.0 - 100.0 fl   MCHC 34.0  30.0 - 36.0 g/dL   RDW 13.6  11.5 - 14.6 %   Platelets 282.0  150.0 - 400.0 K/uL   Neutrophils Relative % 62.2  43.0 - 77.0 %   Lymphocytes Relative 32.1  12.0 - 46.0 %   Monocytes Relative 3.1  3.0 - 12.0 %   Eosinophils Relative 2.1  0.0 - 5.0 %   Basophils Relative 0.5  0.0 - 3.0 %   Neutro Abs 3.9  1.4 - 7.7 K/uL   Lymphs Abs 2.0  0.7 - 4.0 K/uL   Monocytes Absolute 0.2  0.1 - 1.0 K/uL   Eosinophils Absolute 0.1  0.0 - 0.7 K/uL   Basophils Absolute 0.0  0.0 - 0.1 K/uL  TSH     Status: None   Collection Time    07/02/13  5:00 PM      Result Value Ref Range   TSH 1.05  0.35 - 5.50 uIU/mL   Assessment/Plan: HTN (hypertension) BP significantly elevated.  Consistently elevated BP at multiple previous visits. EKG reveals NSR with some poor R-wave progression.  I feel this is most consistent with lead placement.  EKG does reveal LVH.  Will begin Rx HCTZ 12.5 mg daily.  DASH handout given. Encourage exercise and weight  loss. Will obtain TSH. Follow-up with PCP in 2 weeks.  Fluttering sensation of heart EKG reveals NSR with evidence of LVH. Will obtain TSH.  Order placed for Holter Monitor.  Sensation is associated with symptoms of reflux.  Will start trial of 20 mg Omeprazole OTC. Two TUMs before bedtime.  Avoidance of  trigger foods.  Avoid late-night eating.  Elevated HOB.  Follow-up in 2 weeks.  Patient has been educated on alarm signs/symptoms and when to proceed to the ER.  Patient voices understanding.  Acid reflux 2-week trial of OTC omeprazole.  Avoid late-night eating.  Avoid trigger foods.  Elevate HOB.  Encourage diet, exercise and weight loss.

## 2013-07-02 NOTE — Progress Notes (Signed)
Pre visit review using our clinic review tool, if applicable. No additional management support is needed unless otherwise documented below in the visit note/SLS  

## 2013-07-02 NOTE — Patient Instructions (Addendum)
Please obtain labs.  I will call you with your results. Your EKG shows evidence of long-standing high blood pressure. .  You will be contacted to set up a Holter Monitor.Please take Omeprazole daily for 2 weeks.  Avoid late-night eating. Avoid caffeine, spicy foods and fatty foods. Elevate the head of your bed.  Take two TUMS before bedtime.  For Hypertension, please take HCTZ daily as directed.  Read information below on DASH diet.  Follow-up in 2 weeks with Dr. Larose Kells or myself for BP recheck and medication management.  If you develop severe palpitations, chest pain, lightheadedness or shortness of breath, please proceed directly to the ER.    Hypertension As your heart beats, it forces blood through your arteries. This force is your blood pressure. If the pressure is too high, it is called hypertension (HTN) or high blood pressure. HTN is dangerous because you may have it and not know it. High blood pressure may mean that your heart has to work harder to pump blood. Your arteries may be narrow or stiff. The extra work puts you at risk for heart disease, stroke, and other problems.  Blood pressure consists of two numbers, a higher number over a lower, 110/72, for example. It is stated as "110 over 72." The ideal is below 120 for the top number (systolic) and under 80 for the bottom (diastolic). Write down your blood pressure today. You should pay close attention to your blood pressure if you have certain conditions such as:  Heart failure.  Prior heart attack.  Diabetes  Chronic kidney disease.  Prior stroke.  Multiple risk factors for heart disease. To see if you have HTN, your blood pressure should be measured while you are seated with your arm held at the level of the heart. It should be measured at least twice. A one-time elevated blood pressure reading (especially in the Emergency Department) does not mean that you need treatment. There may be conditions in which the blood pressure is  different between your right and left arms. It is important to see your caregiver soon for a recheck. Most people have essential hypertension which means that there is not a specific cause. This type of high blood pressure may be lowered by changing lifestyle factors such as:  Stress.  Smoking.  Lack of exercise.  Excessive weight.  Drug/tobacco/alcohol use.  Eating less salt. Most people do not have symptoms from high blood pressure until it has caused damage to the body. Effective treatment can often prevent, delay or reduce that damage. TREATMENT  When a cause has been identified, treatment for high blood pressure is directed at the cause. There are a large number of medications to treat HTN. These fall into several categories, and your caregiver will help you select the medicines that are best for you. Medications may have side effects. You should review side effects with your caregiver. If your blood pressure stays high after you have made lifestyle changes or started on medicines,   Your medication(s) may need to be changed.  Other problems may need to be addressed.  Be certain you understand your prescriptions, and know how and when to take your medicine.  Be sure to follow up with your caregiver within the time frame advised (usually within two weeks) to have your blood pressure rechecked and to review your medications.  If you are taking more than one medicine to lower your blood pressure, make sure you know how and at what times they should be taken.  Taking two medicines at the same time can result in blood pressure that is too low. SEEK IMMEDIATE MEDICAL CARE IF:  You develop a severe headache, blurred or changing vision, or confusion.  You have unusual weakness or numbness, or a faint feeling.  You have severe chest or abdominal pain, vomiting, or breathing problems. MAKE SURE YOU:   Understand these instructions.  Will watch your condition.  Will get help right  away if you are not doing well or get worse. Document Released: 04/12/2005 Document Revised: 07/05/2011 Document Reviewed: 12/01/2007 [facility] Patient Information 2014 [place].  DADASH Diet The DASH diet stands for "Dietary Approaches to Stop Hypertension." It is a healthy eating plan that has been shown to reduce high blood pressure (hypertension) in as little as 14 days, while also possibly providing other significant health benefits. These other health benefits include reducing the risk of breast cancer after menopause and reducing the risk of type 2 diabetes, heart disease, colon cancer, and stroke. Health benefits also include weight loss and slowing kidney failure in patients with chronic kidney disease.  DIET GUIDELINES  Limit salt (sodium). Your diet should contain less than 1500 mg of sodium daily.  Limit refined or processed carbohydrates. Your diet should include mostly whole grains. Desserts and added sugars should be used sparingly.  Include small amounts of heart-healthy fats. These types of fats include nuts, oils, and tub margarine. Limit saturated and trans fats. These fats have been shown to be harmful in the body. CHOOSING FOODS  The following food groups are based on a 2000 calorie diet. See your Registered Dietitian for individual calorie needs. Grains and Grain Products (6 to 8 servings daily)  Eat More Often: Whole-wheat bread, Burd rice, whole-grain or wheat pasta, quinoa, popcorn without added fat or salt (air popped).  Eat Less Often: White bread, white pasta, white rice, cornbread. Vegetables (4 to 5 servings daily)  Eat More Often: Fresh, frozen, and canned vegetables. Vegetables may be raw, steamed, roasted, or grilled with a minimal amount of fat.  Eat Less Often/Avoid: Creamed or fried vegetables. Vegetables in a cheese sauce. Fruit (4 to 5 servings daily)  Eat More Often: All fresh, canned (in natural juice), or frozen fruits. Dried fruits without  added sugar. One hundred percent fruit juice ( cup [237 mL] daily).  Eat Less Often: Dried fruits with added sugar. Canned fruit in light or heavy syrup. [employer], Fish, and Poultry (2 servings or less daily. One serving is 3 to 4 oz [85-114 g]).  Eat More Often: Ninety percent or leaner ground beef, tenderloin, sirloin. Round cuts of beef, chicken breast, [place] breast. All fish. Grill, bake, or broil your meat. Nothing should be fried.  Eat Less Often/Avoid: Fatty cuts of meat, [place], or chicken leg, thigh, or wing. Fried cuts of meat or fish. Dairy (2 to 3 servings)  Eat More Often: Low-fat or fat-free milk, low-fat plain or light yogurt, reduced-fat or part-skim cheese.  Eat Less Often/Avoid: Milk (whole, 2%).Whole milk yogurt. Full-fat cheeses. Nuts, Seeds, and Legumes (4 to 5 servings per week)  Eat More Often: All without added salt.  Eat Less Often/Avoid: Salted nuts and seeds, canned beans with added salt. Fats and Sweets (limited)  Eat More Often: Vegetable oils, tub margarines without trans fats, sugar-free gelatin. Mayonnaise and salad dressings.  Eat Less Often/Avoid: Coconut oils, palm oils, butter, stick margarine, cream, half and half, cookies, candy, pie. FOR MORE INFORMATION The Dash Diet Eating Plan: www.dashdiet.org Document Released:  04/01/2011 Document Revised: 07/05/2011 Document Reviewed: 04/01/2011 [facility] Patient Information 2014 [place].

## 2013-07-02 NOTE — Telephone Encounter (Signed)
Patient calls and states that she has been having a "flutter feeling" in her chest. Intermittent and usually feels as if she has to burp. Triaged per cardiac protocol. No other symptoms at this time. Patient scheduled to be seen by PA today.

## 2013-07-03 DIAGNOSIS — R002 Palpitations: Secondary | ICD-10-CM

## 2013-07-03 DIAGNOSIS — K219 Gastro-esophageal reflux disease without esophagitis: Secondary | ICD-10-CM | POA: Insufficient documentation

## 2013-07-03 HISTORY — DX: Palpitations: R00.2

## 2013-07-03 HISTORY — DX: Gastro-esophageal reflux disease without esophagitis: K21.9

## 2013-07-03 LAB — TSH: TSH: 1.05 u[IU]/mL (ref 0.35–5.50)

## 2013-07-03 LAB — CBC WITH DIFFERENTIAL/PLATELET
Basophils Absolute: 0 10*3/uL (ref 0.0–0.1)
Basophils Relative: 0.5 % (ref 0.0–3.0)
EOS PCT: 2.1 % (ref 0.0–5.0)
Eosinophils Absolute: 0.1 10*3/uL (ref 0.0–0.7)
HEMATOCRIT: 36 % (ref 36.0–46.0)
HEMOGLOBIN: 12.2 g/dL (ref 12.0–15.0)
LYMPHS ABS: 2 10*3/uL (ref 0.7–4.0)
Lymphocytes Relative: 32.1 % (ref 12.0–46.0)
MCHC: 34 g/dL (ref 30.0–36.0)
MCV: 83.6 fl (ref 78.0–100.0)
MONO ABS: 0.2 10*3/uL (ref 0.1–1.0)
Monocytes Relative: 3.1 % (ref 3.0–12.0)
NEUTROS ABS: 3.9 10*3/uL (ref 1.4–7.7)
Neutrophils Relative %: 62.2 % (ref 43.0–77.0)
Platelets: 282 10*3/uL (ref 150.0–400.0)
RBC: 4.31 Mil/uL (ref 3.87–5.11)
RDW: 13.6 % (ref 11.5–14.6)
WBC: 6.3 10*3/uL (ref 4.5–10.5)

## 2013-07-03 NOTE — Assessment & Plan Note (Signed)
EKG reveals NSR with evidence of LVH. Will obtain TSH.  Order placed for Holter Monitor.  Sensation is associated with symptoms of reflux.  Will start trial of 20 mg Omeprazole OTC. Two TUMs before bedtime.  Avoidance of trigger foods.  Avoid late-night eating.  Elevated HOB.  Follow-up in 2 weeks.  Patient has been educated on alarm signs/symptoms and when to proceed to the ER.  Patient voices understanding.

## 2013-07-03 NOTE — Assessment & Plan Note (Signed)
2-week trial of OTC omeprazole.  Avoid late-night eating.  Avoid trigger foods.  Elevate HOB.  Encourage diet, exercise and weight loss.

## 2013-07-03 NOTE — Assessment & Plan Note (Signed)
BP significantly elevated.  Consistently elevated BP at multiple previous visits. EKG reveals NSR with some poor R-wave progression.  I feel this is most consistent with lead placement.  EKG does reveal LVH.  Will begin Rx HCTZ 12.5 mg daily.  DASH handout given. Encourage exercise and weight loss. Will obtain TSH. Follow-up with PCP in 2 weeks.

## 2013-07-12 ENCOUNTER — Encounter (INDEPENDENT_AMBULATORY_CARE_PROVIDER_SITE_OTHER): Payer: Managed Care, Other (non HMO)

## 2013-07-12 ENCOUNTER — Encounter: Payer: Self-pay | Admitting: *Deleted

## 2013-07-12 DIAGNOSIS — R002 Palpitations: Secondary | ICD-10-CM

## 2013-07-12 NOTE — Progress Notes (Signed)
Patient ID: Felicia Monroe, female   DOB: 07/09/1970, 42 y.o.   MRN: [id number] E-Cardio 24 hour holter monitor applied to patient. 

## 2013-07-12 NOTE — Progress Notes (Signed)
Patient ID: Felicia Monroe, female   DOB: 1971/01/09, 43 y.o.   MRN: [id number] E-Cardio 24 hour holter monitor applied to patient.

## 2013-08-01 ENCOUNTER — Telehealth: Payer: Self-pay | Admitting: Cardiology

## 2013-08-01 NOTE — Telephone Encounter (Signed)
lmtrc

## 2013-08-01 NOTE — Telephone Encounter (Signed)
Pt notified. To Dr. Larose Kells. We are faxing over strips as well.

## 2013-08-01 NOTE — Telephone Encounter (Signed)
Dr. Kathlene November ordered heart monitor.  Dr. [name] Interpretation of heart monitor: NSR. Frequent PVC's. No sustained pathological arrhthymias.

## 2013-08-15 ENCOUNTER — Ambulatory Visit (AMBULATORY_SURGERY_CENTER): Payer: Self-pay | Admitting: *Deleted

## 2013-08-15 VITALS — Ht 65.0 in | Wt 286.0 lb

## 2013-08-15 DIAGNOSIS — Z86010 Personal history of colonic polyps: Secondary | ICD-10-CM

## 2013-08-15 MED ORDER — NA SULFATE-K SULFATE-MG SULF 17.5-3.13-1.6 GM/177ML PO SOLN: 1.0000 | Freq: Once | ORAL | Status: DC

## 2013-08-15 NOTE — Progress Notes (Signed)
No egg or soy allergy. No anesthesia problems.  No home O2.  No diet meds.  

## 2013-08-22 ENCOUNTER — Encounter: Payer: Self-pay | Admitting: Internal Medicine

## 2013-08-29 ENCOUNTER — Encounter: Payer: Self-pay | Admitting: Internal Medicine

## 2013-08-29 ENCOUNTER — Ambulatory Visit (AMBULATORY_SURGERY_CENTER): Payer: Managed Care, Other (non HMO) | Admitting: Internal Medicine

## 2013-08-29 VITALS — BP 141/81 | HR 83 | Temp 97.8°F | Resp 37 | Ht 65.0 in | Wt 286.0 lb

## 2013-08-29 DIAGNOSIS — D126 Benign neoplasm of colon, unspecified: Secondary | ICD-10-CM

## 2013-08-29 DIAGNOSIS — Z860100 Personal history of colon polyps, unspecified: Secondary | ICD-10-CM

## 2013-08-29 DIAGNOSIS — Z800 Family history of malignant neoplasm of digestive organs: Secondary | ICD-10-CM

## 2013-08-29 MED ORDER — SODIUM CHLORIDE 0.9 % IV SOLN: 500.0000 mL | INTRAVENOUS | Status: DC

## 2013-08-29 NOTE — Op Note (Signed)
[place]  [address] [place], [id number]   COLONOSCOPY PROCEDURE REPORT  PATIENT: Felicia Monroe, Kukla  MR#: [id number] BIRTHDATE: 17-Mar-1971 , 46  yrs. old GENDER: Female ENDOSCOPIST: Gatha Mayer, MD, [facility] PROCEDURE DATE:  08/29/2013 PROCEDURE:   Colonoscopy with snare polypectomy First Screening Colonoscopy - Avg.  risk and is 50 yrs.  old or older - No.  Prior Negative Screening - Now for repeat screening. N/A  History of Adenoma - Now for follow-up colonoscopy & has been > or = to 3 yrs.  Yes hx of adenoma.  Has been 3 or more years since last colonoscopy.  Polyps Removed Today? Yes. ASA CLASS:   Class II INDICATIONS:Patient's personal history of adenomatous colon polyps, Patient's immediate family history of colon cancer, and Patient's family history of colon polyps. MEDICATIONS: Propofol (Diprivan) 280 mg IV, MAC sedation, administered by CRNA, and These medications were titrated to patient response per physician's verbal order  DESCRIPTION OF PROCEDURE:   After the risks benefits and alternatives of the procedure were thoroughly explained, informed consent was obtained.  A digital rectal exam revealed no abnormalities of the rectum.   The [id number]  endoscope was introduced through the anus and advanced to the cecum, which was identified by both the appendix and ileocecal valve. No adverse events experienced.   The quality of the prep was excellent using Suprep  The instrument was then slowly withdrawn as the colon was fully examined.  COLON FINDINGS: Three diminutive sessile polyps were found at the cecum, in the ascending colon, and transverse colon.  A polypectomy was performed with a cold snare.  The resection was complete and the polyp tissue was completely retrieved.   The colon mucosa was otherwise normal.   A right colon retroflexion was performed. Retroflexed views revealed no abnormalities. The time to cecum=1 minutes 14  seconds.  Withdrawal time=10 minutes 10 seconds.  The scope was withdrawn and the procedure completed. COMPLICATIONS: There were no complications.  ENDOSCOPIC IMPRESSION: 1.   Three diminutive sessile polyps were found at the cecum, in the ascending colon, and transverse colon; polypectomy was performed with a cold snare 2.   The colon mucosa was otherwise normal in patient w/ hx polyps and Fhx CRCA/polyps  RECOMMENDATIONS: Timing of repeat colonoscopy will be determined by pathology findings.   eSigned:  Gatha Mayer, MD, [facility] 08/29/2013 11:13 AM   cc: The Patient

## 2013-08-29 NOTE — Progress Notes (Signed)
Called to room to assist during endoscopic procedure.  Patient ID and intended procedure confirmed with present staff. Received instructions for my participation in the procedure from the performing physician.  

## 2013-08-29 NOTE — Patient Instructions (Addendum)
I found and removed 3 small polyps that look benign. Next colonoscopy in 3-5 years most likely.  I will let you know pathology results and when to have another routine colonoscopy by mail.  I appreciate the opportunity to care for you. Gatha Mayer, MD, FACG YOU HAD AN ENDOSCOPIC PROCEDURE TODAY AT [facility]: Refer to the procedure report that was given to you for any specific questions about what was found during the examination.  If the procedure report does not answer your questions, please call your gastroenterologist to clarify.  If you requested that your care partner not be given the details of your procedure findings, then the procedure report has been included in a sealed envelope for you to review at your convenience later.  YOU SHOULD EXPECT: Some feelings of bloating in the abdomen. Passage of more gas than usual.  Walking can help get rid of the air that was put into your GI tract during the procedure and reduce the bloating. If you had a lower endoscopy (such as a colonoscopy or flexible sigmoidoscopy) you may notice spotting of blood in your stool or on the toilet paper. If you underwent a bowel prep for your procedure, then you may not have a normal bowel movement for a few days.  DIET: Your first meal following the procedure should be a light meal and then it is ok to progress to your normal diet.  A half-sandwich or bowl of soup is an example of a good first meal.  Heavy or fried foods are harder to digest and may make you feel nauseous or bloated.  Likewise meals heavy in dairy and vegetables can cause extra gas to form and this can also increase the bloating.  Drink plenty of fluids but you should avoid alcoholic beverages for 24 hours.  ACTIVITY: Your care partner should take you home directly after the procedure.  You should plan to take it easy, moving slowly for the rest of the day.  You can resume normal activity the day after the procedure however you  should NOT DRIVE or use heavy machinery for 24 hours (because of the sedation medicines used during the test).    SYMPTOMS TO REPORT IMMEDIATELY: A gastroenterologist can be reached at any hour.  During normal business hours, 8:30 AM to 5:00 PM Monday through Friday, call [phone].  After hours and on weekends, please call the GI answering service at [phone] who will take a message and have the physician on call contact you.   Following lower endoscopy (colonoscopy or flexible sigmoidoscopy):  Excessive amounts of blood in the stool  Significant tenderness or worsening of abdominal pains  Swelling of the abdomen that is new, acute  Fever of 100F or higher    FOLLOW UP: If any biopsies were taken you will be contacted by phone or by letter within the next 1-3 weeks.  Call your gastroenterologist if you have not heard about the biopsies in 3 weeks.  Our staff will call the home number listed on your records the next business day following your procedure to check on you and address any questions or concerns that you may have at that time regarding the information given to you following your procedure. This is a courtesy call and so if there is no answer at the home number and we have not heard from you through the emergency physician on call, we will assume that you have returned to your regular daily activities without incident.  SIGNATURES/CONFIDENTIALITY: You and/or your care partner have signed paperwork which will be entered into your electronic medical record.  These signatures attest to the fact that that the information above on your After Visit Summary has been reviewed and is understood.  Full responsibility of the confidentiality of this discharge information lies with you and/or your care-partner.  Polyp information given.

## 2013-08-29 NOTE — Progress Notes (Signed)
Report to pacu rn, vss, bbs=clear 

## 2013-08-30 ENCOUNTER — Telehealth: Payer: Self-pay | Admitting: *Deleted

## 2013-08-30 NOTE — Telephone Encounter (Signed)
  Follow up Call-  Call back number 08/29/2013  Post procedure Call Back phone  # [phone]  Permission to leave phone message Yes     Patient questions:  Do you have a fever, pain , or abdominal swelling? no Pain Score  0 *  Have you tolerated food without any problems? yes  Have you been able to return to your normal activities? yes  Do you have any questions about your discharge instructions: Diet   no Medications  no Follow up visit  no  Do you have questions or concerns about your Care? no  Actions: * If pain score is 4 or above: No action needed, pain <4.

## 2013-09-03 ENCOUNTER — Encounter: Payer: Self-pay | Admitting: Internal Medicine

## 2013-09-03 NOTE — Progress Notes (Signed)
Quick Note:  ssp x 2 and adenoma x 1 Repeat colon 2018 ______

## 2013-11-06 ENCOUNTER — Encounter: Payer: Self-pay | Admitting: Internal Medicine

## 2013-11-06 ENCOUNTER — Ambulatory Visit (INDEPENDENT_AMBULATORY_CARE_PROVIDER_SITE_OTHER): Payer: Managed Care, Other (non HMO) | Admitting: Internal Medicine

## 2013-11-06 VITALS — BP 148/77 | HR 88 | Temp 98.2°F | Wt 285.0 lb

## 2013-11-06 DIAGNOSIS — J069 Acute upper respiratory infection, unspecified: Secondary | ICD-10-CM

## 2013-11-06 DIAGNOSIS — R002 Palpitations: Secondary | ICD-10-CM

## 2013-11-06 DIAGNOSIS — M25522 Pain in left elbow: Secondary | ICD-10-CM

## 2013-11-06 DIAGNOSIS — I10 Essential (primary) hypertension: Secondary | ICD-10-CM

## 2013-11-06 DIAGNOSIS — M25529 Pain in unspecified elbow: Secondary | ICD-10-CM

## 2013-11-06 MED ORDER — HYDROCHLOROTHIAZIDE 12.5 MG PO TABS: 12.5000 mg | ORAL_TABLET | Freq: Every day | ORAL | Status: DC

## 2013-11-06 NOTE — Progress Notes (Signed)
Subjective:    Patient ID: Felicia Monroe, female    DOB: [birth year], 43 y.o.   MRN: [id number]  DOS:  11/06/2013 Type of visit - description: acute, has several concerns  History: Developed a cold last week: Nose congestion, sneezing, taking Mucinex DM. Symptoms are slightly better. Also complains of indentation of the left lower extremity, noted   for a while, sometimes is more prominent. Also 2 months history of left elbow pain at the external  aspect, symptoms increase when she holds things. Denies any injury, swelling or hand paresthesias. Also needs a form filled  Also asked me about BP management, taking hydrochlorothiazide,  ROS No fever or chills. + Runny nose but no sore throat. + Sinus congestion. Mild cough with a small amount of yellow sputum production.   Past Medical History  Diagnosis Date  . H/O chlamydia infection 04/07/93  . Obesity   . AMA (advanced maternal age) multigravida 64+   . H/O infertility 2004  . H/O polydactyly   . Irregular periods/menstrual cycles   . Uterine fibroid 10/1999  . Monilial vaginitis 12/2001  . Hx of hyperprolactinemia 08/2002  . Microadenoma 08/2010  . Abnormal Pap smear 1995  . 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 H/O candidiasis   . Anemia   . Blood transfusion 2006    [facility]  . Heartburn in pregnancy   . Hypertension   . Personal history of colonic adenomas 04/21/2010  . Allergy     seasonal    Past Surgical History  Procedure Laterality Date  . Cesarean section    . Myomectomy  07/2003  . Uterine fibroid surgery  2005    myomectomy x 1  . Tubal ligation      History   Social History  . Marital Status: Married    Spouse Name: N/A    Number of Children: 2  . Years of Education: N/A   Occupational History  . [employer]   .     Social History Main Topics  . Smoking status: Never Smoker   . Smokeless tobacco: Never Used  . Alcohol Use: Yes     Comment: socially  . Drug Use: No  . Sexual Activity: Not  Currently    Birth Control/ Protection: None     Comment: BTL   Other Topics Concern  . Not on file   Social History Narrative  . No narrative on file        Medication List       This list is accurate as of: 11/06/13 11:59 PM.  Always use your most recent med list.               hydrochlorothiazide 12.5 MG tablet  Commonly known as:  HYDRODIURIL  Take 1 tablet (12.5 mg total) by mouth daily.           Objective:   Physical Exam  Musculoskeletal:       Legs:  BP 148/77  Pulse 88  Temp(Src) 98.2 F (36.8 C)  Wt 285 lb (129.275 kg)  SpO2 99%  General -- alert, well-developed, NAD.  HEENT-- Not pale. TMs normal, throat symmetric, no redness or discharge. Face symmetric, sinuses not tender to palpation. Nose  congested Lungs -- normal respiratory effort, no intercostal retractions, no accessory muscle use, and normal breath sounds.  Heart-- normal rate, regular rhythm, no murmur.  Elbows: Normal to inspection and palpation, full range of motion  Neurologic--  alert & oriented X3. Speech  normal, gait appropriate for age, strength symmetric and appropriate for age.   Psych-- Cognition and judgment appear intact. Cooperative with normal attention span and concentration. No anxious or depressed appearing.       Assessment & Plan:   URI, conservative treatment, see instructions  Indentation LLE: Probably and normal anatomy as it is almost symmetric with the other leg. Recommend observation  Elbow pain, epicondylitis? Exam is normal. Will call if not improving for a sports medicine referral  Paperwork completed  Today , I spent more than  25  min with the patient: >50% of the time counseling regards URI,  "indentation" (reassured)  Also coordinating his care (paperwork)

## 2013-11-06 NOTE — Progress Notes (Signed)
Pre visit review using our clinic review tool, if applicable. No additional management support is needed unless otherwise documented below in the visit note. 

## 2013-11-06 NOTE — Patient Instructions (Signed)
Get your blood work before you leave   For the cold: Rest, fluids , tylenol If cough, take Mucinex DM twice a day as needed  For congestion use OTC Nasocort: 2 nasal sprays on each side of the nose daily until you feel better Stop claritin with pseudoephedrine ok to take regular claritin  Call if no better in few days Call anytime if the symptoms are severe    Check the  blood pressure 2 or 3 times a  week be sure it is between 110/60 and 140/85. Ideal blood pressure is 120/80. If it is consistently higher or lower, let me know  Next visit is for a physical exam in 2 months   fasting Please make an appointment

## 2013-11-07 LAB — BASIC METABOLIC PANEL
BUN: 11 mg/dL (ref 6–23)
CO2: 27 meq/L (ref 19–32)
CREATININE: 0.7 mg/dL (ref 0.4–1.2)
Calcium: 9.5 mg/dL (ref 8.4–10.5)
Chloride: 103 mEq/L (ref 96–112)
GFR: 126.77 mL/min (ref >60.00)
Glucose, Bld: 83 mg/dL (ref 70–99)
Potassium: 3.6 mEq/L (ref 3.5–5.1)
SODIUM: 140 meq/L (ref 135–145)

## 2013-11-07 NOTE — Assessment & Plan Note (Signed)
Hypertension, on a low dose of HCTZ, BP better compared to previous visit, no ambulatory BPs. Plan: BMP, monitor ambulatory BPs, reassess and return to the office

## 2014-01-14 ENCOUNTER — Other Ambulatory Visit: Payer: Self-pay | Admitting: Internal Medicine

## 2014-01-14 DIAGNOSIS — Z1231 Encounter for screening mammogram for malignant neoplasm of breast: Secondary | ICD-10-CM

## 2014-01-17 ENCOUNTER — Ambulatory Visit: Payer: Managed Care, Other (non HMO)

## 2014-01-23 ENCOUNTER — Ambulatory Visit (INDEPENDENT_AMBULATORY_CARE_PROVIDER_SITE_OTHER): Payer: Managed Care, Other (non HMO)

## 2014-01-23 DIAGNOSIS — Z1231 Encounter for screening mammogram for malignant neoplasm of breast: Secondary | ICD-10-CM

## 2014-02-08 ENCOUNTER — Other Ambulatory Visit: Payer: Self-pay

## 2014-02-12 ENCOUNTER — Encounter: Payer: Self-pay | Admitting: Internal Medicine

## 2014-02-12 ENCOUNTER — Ambulatory Visit (INDEPENDENT_AMBULATORY_CARE_PROVIDER_SITE_OTHER): Payer: Managed Care, Other (non HMO) | Admitting: Internal Medicine

## 2014-02-12 VITALS — BP 142/84 | HR 77 | Temp 98.2°F | Ht 65.0 in | Wt 279.1 lb

## 2014-02-12 DIAGNOSIS — I10 Essential (primary) hypertension: Secondary | ICD-10-CM

## 2014-02-12 DIAGNOSIS — Z0000 Encounter for general adult medical examination without abnormal findings: Secondary | ICD-10-CM

## 2014-02-12 LAB — CBC WITH DIFFERENTIAL/PLATELET
Basophils Absolute: 0 10*3/uL (ref 0.0–0.1)
Basophils Relative: 0.6 % (ref 0.0–3.0)
EOS ABS: 0.1 10*3/uL (ref 0.0–0.7)
Eosinophils Relative: 2.1 % (ref 0.0–5.0)
HCT: 36.4 % (ref 36.0–46.0)
HEMOGLOBIN: 11.6 g/dL — AB (ref 12.0–15.0)
Lymphocytes Relative: 32.6 % (ref 12.0–46.0)
Lymphs Abs: 2.1 10*3/uL (ref 0.7–4.0)
MCHC: 32 g/dL (ref 30.0–36.0)
MCV: 83.6 fl (ref 78.0–100.0)
Monocytes Absolute: 0.3 10*3/uL (ref 0.1–1.0)
Monocytes Relative: 5 % (ref 3.0–12.0)
NEUTROS ABS: 3.8 10*3/uL (ref 1.4–7.7)
Neutrophils Relative %: 59.7 % (ref 43.0–77.0)
Platelets: 271 10*3/uL (ref 150.0–400.0)
RBC: 4.36 Mil/uL (ref 3.87–5.11)
RDW: 13.8 % (ref 11.5–15.5)
WBC: 6.4 10*3/uL (ref 4.0–10.5)

## 2014-02-12 LAB — LIPID PANEL
Cholesterol: 118 mg/dL (ref 0–200)
HDL: 49.6 mg/dL (ref >39.00)
LDL Cholesterol: 55 mg/dL (ref 0–99)
NONHDL: 68.4
Total CHOL/HDL Ratio: 2
Triglycerides: 65 mg/dL (ref 0.0–149.0)
VLDL: 13 mg/dL (ref 0.0–40.0)

## 2014-02-12 NOTE — Assessment & Plan Note (Addendum)
Tdap 2013 Flu shot - discussed, declined  + FH colon ca and polyps Cscope 03-2010, 2 adenomatous polyps Cscope 12-2013, next 3 years  Dr Suzi Roots gyn, due for a visit, has an appointment already  MMG--->12-2013 neg  Long discussion about diet and exercise. WW? rec to have a weekly meal  plan  Labs

## 2014-02-12 NOTE — Assessment & Plan Note (Signed)
Ambulatory BPs in the 140s, with discussed  possibly changing medication versus continue with same and  work on lifestyle.  She elected to work on lifestyle. Return to the office in 6 months

## 2014-02-12 NOTE — Progress Notes (Signed)
Subjective:    Patient ID: Felicia Monroe, female    DOB: 1970-09-12, 43 y.o.   MRN: [id number]  DOS:  02/12/2014 Type of visit - description : CPX Interval history: No major concerns, has been unable to change her lifestyle. No recent ambulatory BPs     ROS No  CP, SOB No palpitations, no lower extremity edema Denies  nausea, vomiting diarrhea, blood in the stools (-) cough, sputum production (-) wheezing, chest congestion No dysuria, gross hematuria, difficulty urinating  + stress but no anxiety, depression    Past Medical History  Diagnosis Date  . H/O chlamydia infection 04/07/93  . Obesity   . AMA (advanced maternal age) multigravida 25+   . H/O infertility 2004  . H/O polydactyly   . Irregular periods/menstrual cycles   . Uterine fibroid 10/1999  . Monilial vaginitis 12/2001  . Hx of hyperprolactinemia 08/2002  . Microadenoma 08/2010  . Abnormal Pap smear 1995  . 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 H/O candidiasis   . Anemia   . Blood transfusion 2006    [place]  . Heartburn in pregnancy   . Hypertension   . Personal history of colonic adenomas 04/21/2010  . Allergy     seasonal    Past Surgical History  Procedure Laterality Date  . Cesarean section    . Myomectomy  07/2003  . Uterine fibroid surgery  2005    myomectomy x 1  . Tubal ligation      History   Social History  . Marital Status: Married    Spouse Name: N/A    Number of Children: 2  . Years of Education: N/A   Occupational History  . [employer]   .     Social History Main Topics  . Smoking status: Never Smoker   . Smokeless tobacco: Never Used  . Alcohol Use: Yes     Comment: socially  . Drug Use: No  . Sexual Activity: Not Currently    Birth Control/ Protection: None     Comment: BTL   Other Topics Concern  . Not on file   Social History Narrative   household- pt, husband, 2 children 50 and 2 y/o     Family History  Problem Relation Age of Onset  . Colon cancer Father 72      colon  . Hypertension Father   . Colon polyps Brother     x 2  . Hypertension Mother     M. brother , father   . Colon polyps Sister   . Coronary artery disease Other     GM, aunt  . Diabetes Mother   . Hypertension Mother   . Diabetes Sister   . Heart disease Maternal Grandmother   . Diabetes Maternal Grandmother   . Breast cancer Neg Hx       Medication List       This list is accurate as of: 02/12/14  6:19 PM.  Always use your most recent med list.               hydrochlorothiazide 12.5 MG tablet  Commonly known as:  HYDRODIURIL  Take 1 tablet (12.5 mg total) by mouth daily.           Objective:   Physical Exam BP 142/84  Pulse 77  Temp(Src) 98.2 F (36.8 C) (Oral)  Ht 5\' 5"  (1.651 m)  Wt 279 lb 2 oz (126.61 kg)  BMI 46.45 kg/m2  SpO2 100%  LMP 02/10/2014  General -- alert, well-developed, NAD.  Neck --no thyromegaly , normal carotid pulse  HEENT-- Not pale.   Lungs -- normal respiratory effort, no intercostal retractions, no accessory muscle use, and normal breath sounds.  Heart-- normal rate, regular rhythm, no murmur.  Abdomen-- Not distended, good bowel sounds,soft, non-tender. Extremities-- trace pretibial edema bilaterally  Neurologic--  alert & oriented X3. Speech normal, gait appropriate for age, strength symmetric and appropriate for age.  Psych-- Cognition and judgment appear intact. Cooperative with normal attention span and concentration. No anxious or depressed appearing.     Assessment & Plan:

## 2014-02-12 NOTE — Progress Notes (Signed)
Pre visit review using our clinic review tool, if applicable. No additional management support is needed unless otherwise documented below in the visit note. 

## 2014-02-12 NOTE — Patient Instructions (Signed)
Get your blood work before you leave   Check the  blood pressure 2 or 3 times a   Week  Be sure your blood pressure is between  140/85  and 110/65.  if it is consistently higher or lower, let me know   If you need more information about a healthy diet,   visit  the American Heart Association, it  is a great resource online at:  http://www.richard-flynn.net/   Please come back to the office in 6 months for a routine check up  No  fasting

## 2014-02-25 ENCOUNTER — Encounter: Payer: Self-pay | Admitting: Internal Medicine

## 2014-03-24 IMAGING — CR DG RIBS W/ CHEST 3+V*R*
3 series · 3 of 3 positions shown · non-contrast
Comparison: Prior radiograph from 04/03/2013

CLINICAL DATA: Cough for 2 months with right-sided chest pain

EXAM:
RIGHT RIBS AND CHEST - 3+ VIEW

[view not recorded (1 of 3)]
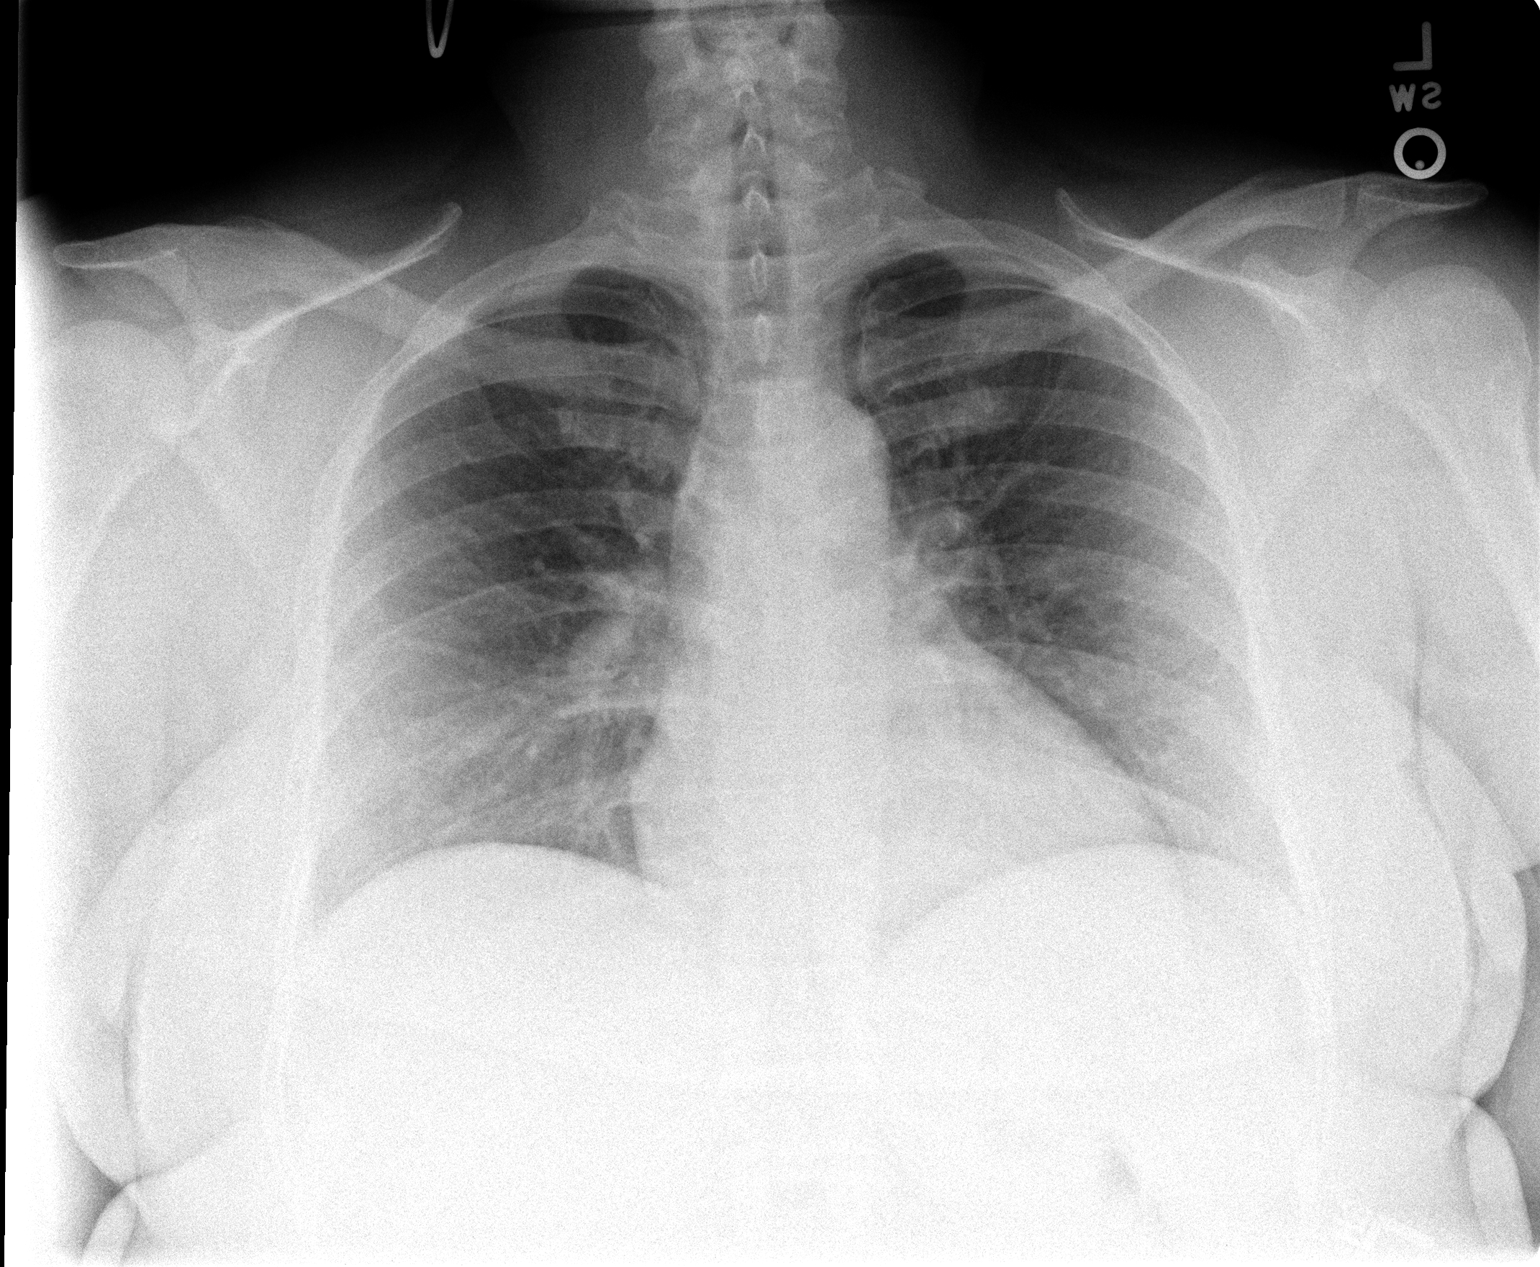

[view not recorded (2 of 3)]
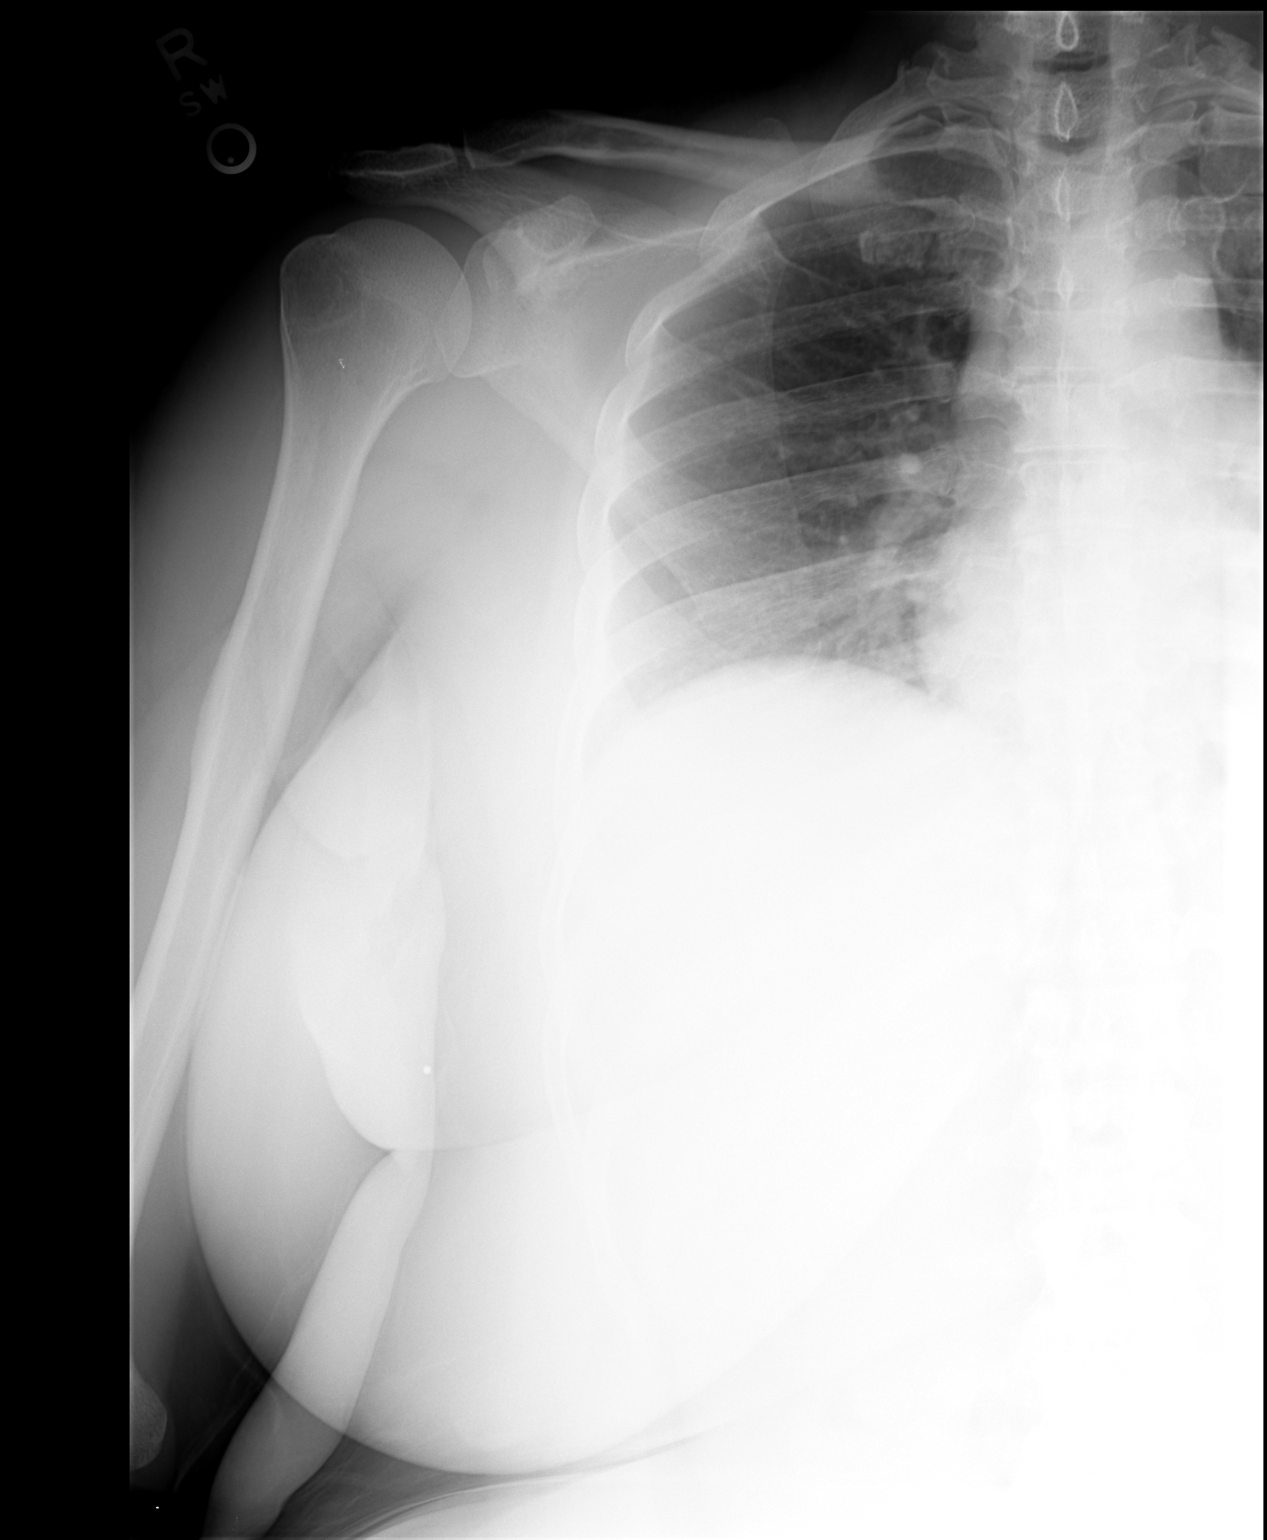

[view not recorded (3 of 3)]
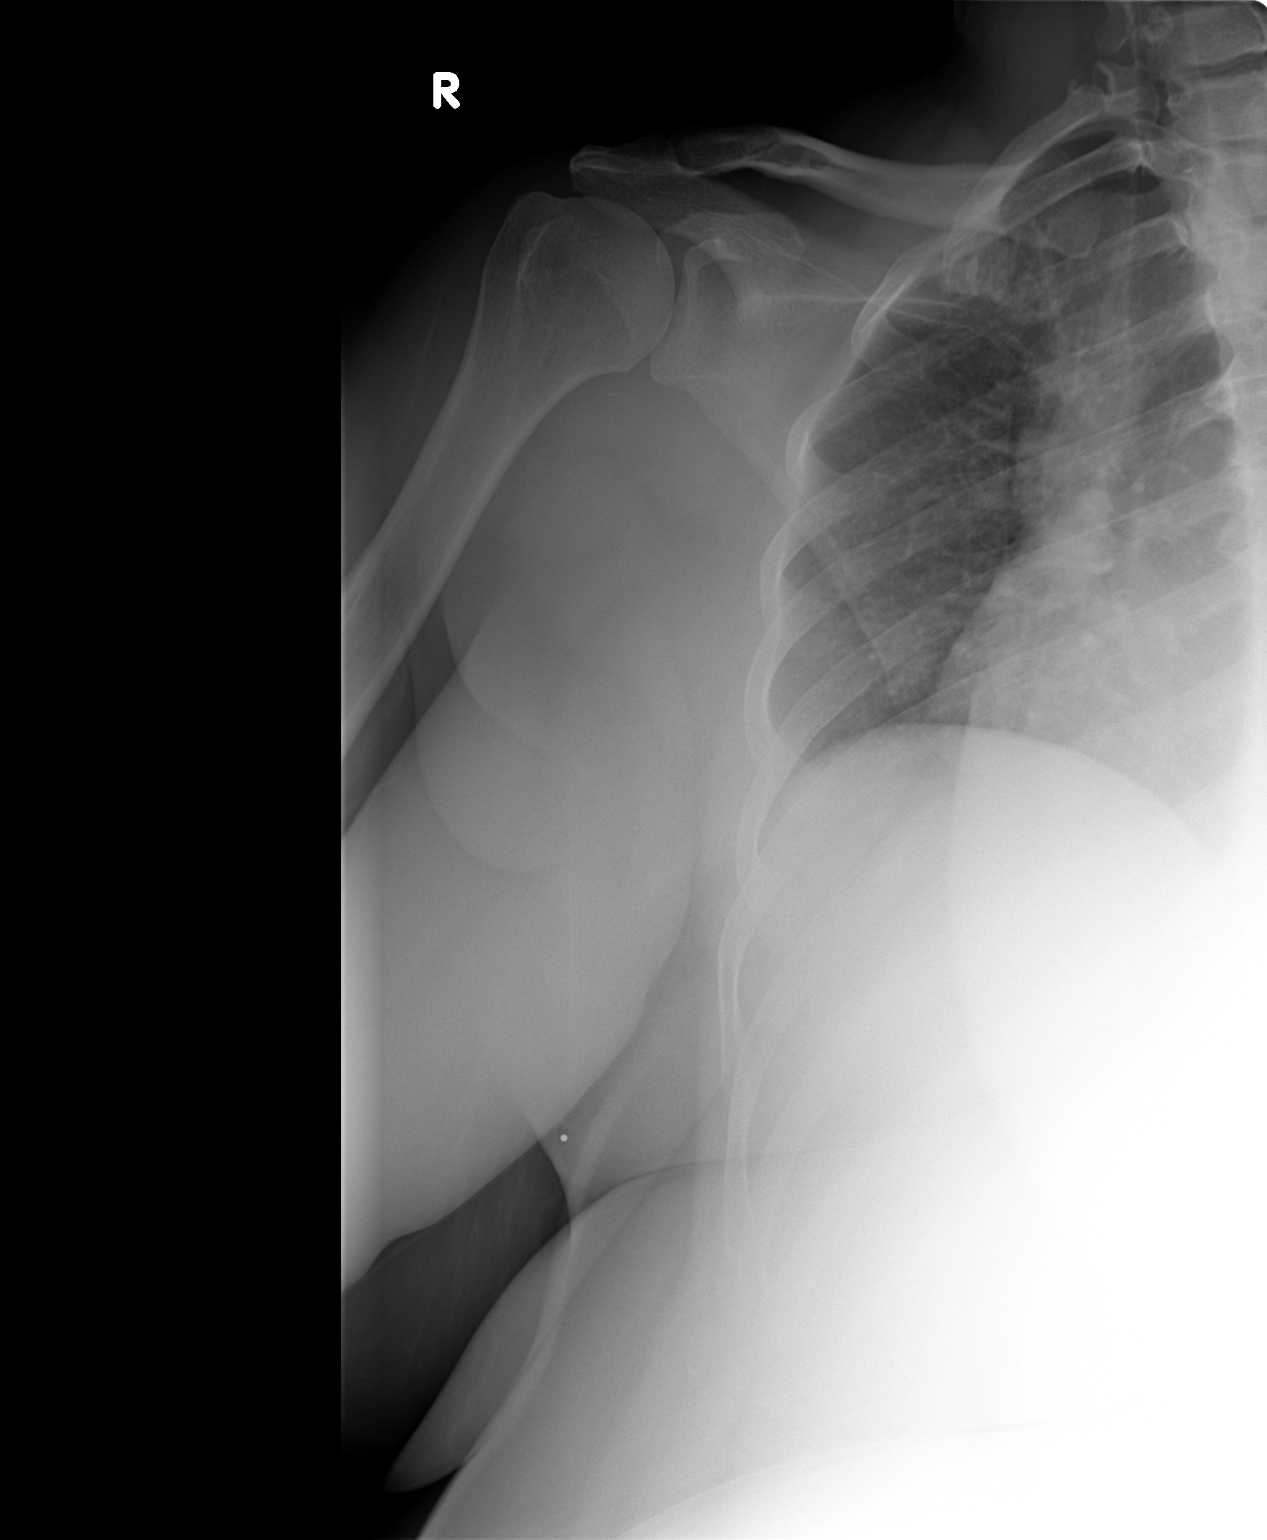

[3 of 3 positions shown; findings below may reference images not displayed]

FINDINGS: No fracture or other bone lesions are seen involving the ribs. There
is no evidence of pneumothorax or pleural effusion. Both lungs are
clear. Heart size and mediastinal contours are within normal limits.
IMPRESSION: 1. No acute cardiopulmonary abnormality.
2. No rib fracture or other acute osseous abnormality identified.

## 2014-07-04 ENCOUNTER — Ambulatory Visit (INDEPENDENT_AMBULATORY_CARE_PROVIDER_SITE_OTHER): Payer: Managed Care, Other (non HMO)

## 2014-07-04 ENCOUNTER — Encounter: Payer: Self-pay | Admitting: Podiatry

## 2014-07-04 ENCOUNTER — Ambulatory Visit (INDEPENDENT_AMBULATORY_CARE_PROVIDER_SITE_OTHER): Payer: Managed Care, Other (non HMO) | Admitting: Podiatry

## 2014-07-04 VITALS — BP 149/79 | HR 83 | Resp 16

## 2014-07-04 DIAGNOSIS — M722 Plantar fascial fibromatosis: Secondary | ICD-10-CM

## 2014-07-04 DIAGNOSIS — M7661 Achilles tendinitis, right leg: Secondary | ICD-10-CM | POA: Diagnosis not present

## 2014-07-04 MED ORDER — MELOXICAM 15 MG PO TABS: 15.0000 mg | ORAL_TABLET | Freq: Every day | ORAL | Status: DC

## 2014-07-04 MED ORDER — METHYLPREDNISOLONE (PAK) 4 MG PO TABS: ORAL_TABLET | ORAL | Status: DC

## 2014-07-04 NOTE — Progress Notes (Signed)
   Subjective:    Patient ID: [name], female    DOB: 1970/11/05, 44 y.o.   MRN: [id number]  HPI Comments: "I have pain in the heel"  Patient c/o aching posterior heel and some plantar heel right since October 2015. She has AM pain sometimes. She did see orthopedist-gave naproxen-no help. No other treatment at home.  Foot Pain      Review of Systems  All other systems reviewed and are negative.      Objective:   Physical Exam: I have reviewed her past medical history medications allergy surgery social history and review of systems. Pulses are strongly palpable bilateral. Neurologic sensorium is intact percent twisting monofilament. Deep tendon reflexes are intact bilateral muscle strength +5 over 5 dorsiflexion plantar flexors and inverters everters all intrinsic musculature is intact. Orthopedic evaluation to ensure that all joints distal to the ankle for range of motion without crepitation. She does have pain on palpation at the insertion site of her Achilles tendon of her right heel. This is moderately tender on palpation she has no tenderness on palpation medial tubercle of the right heel radiographic evaluation does demonstrate a retrocalcaneal heel spur was considerable thickening of the Achilles tendon twice its normal diameter. There does appear to be considerable amount of fluid or inflammation in the posterior aspect of the ankle. Cutaneous evaluation demonstrates supple well-hydrated cutis no erythema edema saline strength or odor.        Assessment & Plan:  Assessment: Insertional Achilles tendinitis of the right heel.  Plan: Discussed etiology pathology conservative versus surgical therapies. I offered her a dexamethasone injection with the concomitant use of a Cam Walker. She declined this treatment plan stating that she was going to be out of town for a period of time and did not want to wear a Cam Walker. I would not provide her with an injection without her  wearing the Cam Walker. I did not feel comfortable that she would do so. Offered a night splint which she declined as well. I placed her on a Medrol Dosepak to be followed by meloxicam. I will follow-up with her as needed.

## 2014-07-31 ENCOUNTER — Other Ambulatory Visit: Payer: Self-pay

## 2014-08-08 ENCOUNTER — Ambulatory Visit (INDEPENDENT_AMBULATORY_CARE_PROVIDER_SITE_OTHER): Payer: Managed Care, Other (non HMO) | Admitting: Podiatry

## 2014-08-08 ENCOUNTER — Encounter: Payer: Self-pay | Admitting: Podiatry

## 2014-08-08 VITALS — BP 147/64 | HR 82 | Resp 16

## 2014-08-08 DIAGNOSIS — M7661 Achilles tendinitis, right leg: Secondary | ICD-10-CM | POA: Diagnosis not present

## 2014-08-08 NOTE — Progress Notes (Signed)
She presents today for follow-up of her Achilles tendinitis to her right foot. She states that is doing a lot better.  Objective: Vital signs are stable she is alert and oriented 3. Pulses are palpable right foot. No pain on palpation medial calcaneal tubercle of the right heel.  Assessment: Well-healing Achilles tendinitis right.  Plan: I encouraged her to continue all conservative therapies and I will follow-up with her in 6 weeks if necessary.

## 2014-08-12 ENCOUNTER — Other Ambulatory Visit: Payer: Self-pay

## 2014-08-15 ENCOUNTER — Telehealth: Payer: Self-pay

## 2014-08-15 ENCOUNTER — Ambulatory Visit (INDEPENDENT_AMBULATORY_CARE_PROVIDER_SITE_OTHER): Payer: Managed Care, Other (non HMO) | Admitting: Internal Medicine

## 2014-08-15 ENCOUNTER — Encounter: Payer: Self-pay | Admitting: Internal Medicine

## 2014-08-15 VITALS — BP 128/84 | HR 76 | Temp 98.0°F | Ht 65.0 in | Wt 285.4 lb

## 2014-08-15 DIAGNOSIS — I10 Essential (primary) hypertension: Secondary | ICD-10-CM | POA: Diagnosis not present

## 2014-08-15 MED ORDER — TRIAMTERENE-HCTZ 37.5-25 MG PO TABS: 1.0000 | ORAL_TABLET | Freq: Every day | ORAL | Status: DC

## 2014-08-15 NOTE — Telephone Encounter (Signed)
Pt came into office today (08/15/2014) for routine OV. With her, she had her Wellness Screening form. Informed her we would complete form and fax to number on form [phone]) and mail original copies back to her. Pt verbalized understanding.

## 2014-08-15 NOTE — Progress Notes (Signed)
Pre visit review using our clinic review tool, if applicable. No additional management support is needed unless otherwise documented below in the visit note. 

## 2014-08-15 NOTE — Patient Instructions (Addendum)
  Stop hydrochlorothiazide Start the new blood pressure medication called triamterene- hydrochlorothiazide  Come back in 3 weeks for blood work only, BMP hypertension  Check the  blood pressure weekly  Be sure your blood pressure is between 110/65 and  145/85.  if it is consistently higher or lower, let me know    If your blood pressure is well-controlled, then the next visit to see me will be in 6 months for a physical, please make an appointment

## 2014-08-15 NOTE — Assessment & Plan Note (Addendum)
Good compliance with hydrochlorothiazide 12.5 mg daily, although blood pressure is very good today, in the ambulatory setting the systolic BP is going to the 140s-150s. Plan:  Switch to Maxzide 25 BMP in 3 weeks I encouraged a healthier lifestyle If the new medication works and she feels well-- > we will refill 90 day supply and follow-up in 6 months.

## 2014-08-15 NOTE — Telephone Encounter (Signed)
Form completed, and signed by Dr. Larose Kells and faxed to [facility] and original sent for scanning. Copies made to be mailed back to Pt

## 2014-08-15 NOTE — Progress Notes (Signed)
e  Subjective:    Patient ID: Felicia Monroe, female    DOB: [birth year], 44 y.o.   MRN: [id number]  DOS:  08/15/2014 Type of visit - description : rov Interval history: Good compliance with hydrochlorothiazide, in the ambulatory setting BPs around 140, 150/80. Unable to change her lifestyle since the last visit   Review of Systems  Denies chest pain or difficulty breathing. No lower extremity edema No nausea, vomiting, diarrhea Past Medical History  Diagnosis Date  . H/O chlamydia infection 04/07/93  . Obesity   . AMA (advanced maternal age) multigravida 63+   . H/O infertility 2004  . H/O polydactyly   . Irregular periods/menstrual cycles   . Uterine fibroid 10/1999  . Hx of hyperprolactinemia 08/2002  . Microadenoma 08/2010  . Abnormal Pap smear 1995  . 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 Anemia   . Blood transfusion 2006    [place]  . Heartburn in pregnancy   . Hypertension   . Personal history of colonic adenomas 04/21/2010  . Allergy     seasonal    Past Surgical History  Procedure Laterality Date  . Cesarean section    . Myomectomy  07/2003  . Uterine fibroid surgery  2005    myomectomy x 1  . Tubal ligation      History   Social History  . Marital Status: Married    Spouse Name: N/A  . Number of Children: 2  . Years of Education: N/A   Occupational History  . [employer]   .     Social History Main Topics  . Smoking status: Never Smoker   . Smokeless tobacco: Never Used  . Alcohol Use: Yes     Comment: socially  . Drug Use: No  . Sexual Activity: Not Currently    Birth Control/ Protection: None     Comment: BTL   Other Topics Concern  . Not on file   Social History Narrative   household- pt, husband, 2 children 52 and 2 y/o        Medication List       This list is accurate as of: 08/15/14 11:59 PM.  Always use your most recent med list.               meloxicam 15 MG tablet  Commonly known as:  MOBIC  Take 1 tablet (15 mg total) by  mouth daily.     triamterene-hydrochlorothiazide 37.5-25 MG per tablet  Commonly known as:  MAXZIDE-25  Take 1 tablet by mouth daily.           Objective:   Physical Exam BP 128/84 mmHg  Pulse 76  Temp(Src) 98 F (36.7 C) (Oral)  Ht 5\' 5"  (1.651 m)  Wt 285 lb 6 oz (129.445 kg)  BMI 47.49 kg/m2  SpO2 97%  LMP 07/25/2014 (Exact Date) General:   Well developed, well nourished . NAD.  HEENT:  Normocephalic . Face symmetric, atraumatic Lungs:  CTA B Normal respiratory effort, no intercostal retractions, no accessory muscle use. Heart: RRR,  no murmur.  Muscle skeletal: no pretibial edema bilaterally  Skin: Not pale. Not jaundice Neurologic:  alert & oriented X3.  Speech normal, gait appropriate for age and unassisted Psych--  Cognition and judgment appear intact.  Cooperative with normal attention span and concentration.  Behavior appropriate. No anxious or depressed appearing.        Assessment & Plan:

## 2014-09-05 ENCOUNTER — Other Ambulatory Visit: Payer: Self-pay

## 2014-09-05 ENCOUNTER — Other Ambulatory Visit (INDEPENDENT_AMBULATORY_CARE_PROVIDER_SITE_OTHER): Payer: Managed Care, Other (non HMO)

## 2014-09-05 DIAGNOSIS — I10 Essential (primary) hypertension: Secondary | ICD-10-CM

## 2014-09-05 LAB — BASIC METABOLIC PANEL
BUN: 14 mg/dL (ref 6–23)
CALCIUM: 8.8 mg/dL (ref 8.4–10.5)
CO2: 31 mEq/L (ref 19–32)
Chloride: 104 mEq/L (ref 96–112)
Creatinine, Ser: 0.63 mg/dL (ref 0.40–1.20)
GFR: 130.94 mL/min (ref >60.00)
Glucose, Bld: 98 mg/dL (ref 70–99)
Potassium: 3.7 mEq/L (ref 3.5–5.1)
SODIUM: 138 meq/L (ref 135–145)

## 2014-09-12 ENCOUNTER — Ambulatory Visit: Payer: Managed Care, Other (non HMO) | Admitting: Podiatry

## 2014-10-22 ENCOUNTER — Telehealth: Payer: Self-pay | Admitting: *Deleted

## 2014-10-22 NOTE — Telephone Encounter (Signed)
Faxed request from [facility] for Meloxicam 15mg  #90 one tablet qd.  Dr Milinda Pointer ordered refill as previously plus one refill, must be seen in office prior to future refills.  Faxed.

## 2014-10-31 ENCOUNTER — Telehealth: Payer: Self-pay | Admitting: Internal Medicine

## 2014-10-31 MED ORDER — TRIAMTERENE-HCTZ 37.5-25 MG PO TABS: 1.0000 | ORAL_TABLET | Freq: Every day | ORAL | Status: DC

## 2014-10-31 NOTE — Telephone Encounter (Signed)
Caller name: Nevaya Relation to pt: Call back number: [phone] Pharmacy:  Reason for call:   Patient is requesting an emergency supply of triamterene to be sent to local pharmacy. She is waiting for her refill from the mail order pharmacy. She has been out for about a week now. [facility] [address] in [place]

## 2014-10-31 NOTE — Telephone Encounter (Signed)
Rx sent to [facility] on [address] in [place] as requested.

## 2014-11-27 ENCOUNTER — Telehealth: Payer: Self-pay | Admitting: Internal Medicine

## 2014-11-27 MED ORDER — TRIAMTERENE-HCTZ 37.5-25 MG PO TABS: 1.0000 | ORAL_TABLET | Freq: Every day | ORAL | Status: DC

## 2014-11-27 NOTE — Telephone Encounter (Signed)
Pt would like refill for 30 day supply of triamterene-hydrochlorothiazide (MAXZIDE-25) 37.5-25 MG per tablet [[id number]]  Sent to local pharmacy ([facility] in [place]). Also send to [facility] for 90 day supply. Pt was here in April for 6 month f/u and scheduled for CPE in October. She only got 1 month refill last time to local pharm.

## 2014-11-27 NOTE — Telephone Encounter (Signed)
Rx sent to [facility] pharmacy in [place] for 30 day supply. 90 day supply sent to [facility] as requested.

## 2014-12-26 ENCOUNTER — Telehealth: Payer: Self-pay | Admitting: *Deleted

## 2014-12-26 NOTE — Telephone Encounter (Signed)
Pt states she is schedule for 01/06/2105 and would like to know what to do for heel pain until then.  Pt states she checked the internet and ice and stretching were recommended.  I told her ice 3-4 times a day for 10-15 mins each time and the internet stretches were good, call again with concerns.

## 2015-01-07 ENCOUNTER — Encounter: Payer: Self-pay | Admitting: Podiatry

## 2015-01-07 ENCOUNTER — Ambulatory Visit (INDEPENDENT_AMBULATORY_CARE_PROVIDER_SITE_OTHER): Payer: Managed Care, Other (non HMO) | Admitting: Podiatry

## 2015-01-07 VITALS — BP 119/59 | HR 84 | Resp 16

## 2015-01-07 DIAGNOSIS — M722 Plantar fascial fibromatosis: Secondary | ICD-10-CM | POA: Diagnosis not present

## 2015-01-07 DIAGNOSIS — M7661 Achilles tendinitis, right leg: Secondary | ICD-10-CM

## 2015-01-07 MED ORDER — METHYLPREDNISOLONE 4 MG PO TBPK: ORAL_TABLET | ORAL | Status: DC

## 2015-01-07 NOTE — Progress Notes (Signed)
She presents today as an established patient. Last time she was in she had pain to the right Achilles tendon at its insertion now she is stating she has pain for the past several months to her right heel.  Objective: Vital signs are stable she is alert and oriented 3 pulses are palpable. Her sensorium is intact. Orthopedic evaluation demonstrates mild tennis on palpation of the Achilles but severe pain on palpation medial calcaneal tubercle of the right heel. Radiographs taken today demonstrate no osseous abnormalities other than a soft tissue increase in density at the plantar fascial calcaneal insertion site.  Assessment: Pain in limb secondary to plantar fasciitis right foot.  Plan: Injected the right heel today wrote a prescription for a Medrol Dosepak. Placed her in a plantar fascial brace and a night splint. Discussed appropriate shoe gear stretching exercises ice therapy and shoe gear modifications.

## 2015-01-23 ENCOUNTER — Other Ambulatory Visit: Payer: Self-pay | Admitting: Internal Medicine

## 2015-01-23 DIAGNOSIS — Z1239 Encounter for other screening for malignant neoplasm of breast: Secondary | ICD-10-CM

## 2015-01-27 ENCOUNTER — Telehealth: Payer: Self-pay | Admitting: Internal Medicine

## 2015-01-27 ENCOUNTER — Ambulatory Visit (HOSPITAL_BASED_OUTPATIENT_CLINIC_OR_DEPARTMENT_OTHER)
Admission: RE | Admit: 2015-01-27 | Discharge: 2015-01-27 | Disposition: A | Discharge status: Home / Self Care | Payer: Managed Care, Other (non HMO) | Source: Ambulatory Visit | Attending: Internal Medicine | Admitting: Internal Medicine

## 2015-01-27 DIAGNOSIS — Z1239 Encounter for other screening for malignant neoplasm of breast: Secondary | ICD-10-CM

## 2015-01-27 DIAGNOSIS — Z1231 Encounter for screening mammogram for malignant neoplasm of breast: Secondary | ICD-10-CM | POA: Insufficient documentation

## 2015-01-27 NOTE — Telephone Encounter (Signed)
Pre visit letter mailed 01/27/15  °

## 2015-02-04 ENCOUNTER — Encounter: Payer: Self-pay | Admitting: Podiatry

## 2015-02-04 ENCOUNTER — Ambulatory Visit (INDEPENDENT_AMBULATORY_CARE_PROVIDER_SITE_OTHER): Payer: Managed Care, Other (non HMO) | Admitting: Podiatry

## 2015-02-04 VITALS — BP 124/66 | HR 80 | Resp 16

## 2015-02-04 DIAGNOSIS — M722 Plantar fascial fibromatosis: Secondary | ICD-10-CM | POA: Diagnosis not present

## 2015-02-04 NOTE — Progress Notes (Signed)
She presents today after having her initial injection for plantar fasciitis to her right heel. She states that she continues to wear her night splint and her plantar fascial brace. She states that she is approximately 50% improved. She states that she continues to take her meloxicam periodically but not on a regular basis.  Objective: Vital signs are stable alert and oriented 3. Pulses are palpable. Still has some tenderness on palpation medial calcaneal tubercle of the right heel but has improved considerably.  Assessment: Plantar fasciitis right.  Plan: Discussed etiology pathology conservative versus surgical therapies. I injected the right heel again today. Encouraged her to take her meloxicam on a regular basis. Also suggested that she continue the plantar fascial brace and a night splint and ice therapy. Follow-up with her in 1 month call sooner if needed.  Roselind Messier DPM

## 2015-02-04 NOTE — Patient Instructions (Signed)

## 2015-02-17 ENCOUNTER — Encounter: Payer: Self-pay | Admitting: Internal Medicine

## 2015-02-17 ENCOUNTER — Ambulatory Visit (INDEPENDENT_AMBULATORY_CARE_PROVIDER_SITE_OTHER): Payer: Managed Care, Other (non HMO) | Admitting: Internal Medicine

## 2015-02-17 VITALS — BP 122/74 | HR 77 | Temp 98.2°F | Ht 65.0 in | Wt 271.1 lb

## 2015-02-17 DIAGNOSIS — Z0000 Encounter for general adult medical examination without abnormal findings: Secondary | ICD-10-CM

## 2015-02-17 DIAGNOSIS — Z09 Encounter for follow-up examination after completed treatment for conditions other than malignant neoplasm: Secondary | ICD-10-CM | POA: Insufficient documentation

## 2015-02-17 LAB — CBC WITH DIFFERENTIAL/PLATELET
Basophils Absolute: 0 10*3/uL (ref 0.0–0.1)
Basophils Relative: 0.3 % (ref 0.0–3.0)
Eosinophils Absolute: 0.1 10*3/uL (ref 0.0–0.7)
Eosinophils Relative: 1.3 % (ref 0.0–5.0)
HEMATOCRIT: 36.4 % (ref 36.0–46.0)
HEMOGLOBIN: 12 g/dL (ref 12.0–15.0)
LYMPHS PCT: 28.1 % (ref 12.0–46.0)
Lymphs Abs: 2.2 10*3/uL (ref 0.7–4.0)
MCHC: 33 g/dL (ref 30.0–36.0)
MCV: 83 fl (ref 78.0–100.0)
MONOS PCT: 4.4 % (ref 3.0–12.0)
Monocytes Absolute: 0.3 10*3/uL (ref 0.1–1.0)
Neutro Abs: 5.1 10*3/uL (ref 1.4–7.7)
Neutrophils Relative %: 65.9 % (ref 43.0–77.0)
Platelets: 298 10*3/uL (ref 150.0–400.0)
RBC: 4.39 Mil/uL (ref 3.87–5.11)
RDW: 14.3 % (ref 11.5–15.5)
WBC: 7.8 10*3/uL (ref 4.0–10.5)

## 2015-02-17 LAB — BASIC METABOLIC PANEL
BUN: 14 mg/dL (ref 6–23)
CALCIUM: 9.6 mg/dL (ref 8.4–10.5)
CO2: 30 meq/L (ref 19–32)
Chloride: 103 mEq/L (ref 96–112)
Creatinine, Ser: 0.85 mg/dL (ref 0.40–1.20)
GFR: 92.49 mL/min (ref >60.00)
Glucose, Bld: 97 mg/dL (ref 70–99)
Potassium: 3.6 mEq/L (ref 3.5–5.1)
SODIUM: 141 meq/L (ref 135–145)

## 2015-02-17 LAB — FERRITIN: Ferritin: 109 ng/mL (ref 10.0–291.0)

## 2015-02-17 LAB — IRON: IRON: 41 ug/dL — AB (ref 42–145)

## 2015-02-17 NOTE — Progress Notes (Signed)
Subjective:    Patient ID: Felicia Monroe, female    DOB: 12-Jun-1970, 44 y.o.   MRN: [id number]  DOS:  02/17/2015 Type of visit - description : CPX Interval history: Doing well, has improved his diet, trying to exercise more, has lost weight. Having problems with plantar fasciitis >>> working with the podiatrist   Review of Systems  Constitutional: No fever. No chills. No unexplained wt changes. No unusual sweats  HEENT: No dental problems, no ear discharge, no facial swelling, no voice changes. No eye discharge, no eye  redness , no  intolerance to light   Respiratory: No wheezing , no  difficulty breathing. No cough , no mucus production  Cardiovascular: No CP, no leg swelling , no  Palpitations  GI: no nausea, no vomiting, no diarrhea , no  abdominal pain.  No blood in the stools. No dysphagia, no odynophagia    Endocrine: No polyphagia, no polyuria , no polydipsia  GU: No dysuria, gross hematuria, difficulty urinating. No urinary urgency, no frequency. Periods are still irregular but heavy only 3 days at a time.  Musculoskeletal: No joint swellings or unusual aches or pains  Skin: No change in the color of the skin, palor , no  Rash  Allergic, immunologic: No environmental allergies , no  food allergies  Neurological: No dizziness no  syncope. No headaches. No diplopia, no slurred, no slurred speech, no motor deficits, no facial  Numbness  Hematological: No enlarged lymph nodes, no easy bruising , no unusual bleedings  Psychiatry: No suicidal ideas, no hallucinations, no beavior problems, no confusion.  No unusual/severe anxiety, no depression   Past Medical History  Diagnosis Date  . H/O chlamydia infection 04/07/93  . Obesity   . AMA (advanced maternal age) multigravida 33+   . H/O infertility 2004  . H/O polydactyly   . Irregular periods/menstrual cycles   . Uterine fibroid 10/1999  . Hx of hyperprolactinemia 08/2002  . Microadenoma ([place]) 08/2010  . Abnormal  Pap smear 1995  . 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 Anemia   . Blood transfusion 2006    [place]  . Heartburn in pregnancy   . Hypertension   . Personal history of colonic adenomas 04/21/2010  . Allergy     seasonal    Past Surgical History  Procedure Laterality Date  . Cesarean section    . Myomectomy  07/2003  . Uterine fibroid surgery  2005    myomectomy x 1  . Tubal ligation      Social History   Social History  . Marital Status: Married    Spouse Name: N/A  . Number of Children: 2  . Years of Education: N/A   Occupational History  . [employer]   .     Social History Main Topics  . Smoking status: Never Smoker   . Smokeless tobacco: Never Used  . Alcohol Use: Yes     Comment: socially  . Drug Use: No  . Sexual Activity: Not Currently    Birth Control/ Protection: None     Comment: BTL   Other Topics Concern  . Not on file   Social History Narrative   household- pt, husband, 2 children 50 and 64 y/o   Family History  Problem Relation Age of Onset  . Colon cancer Father 68    colon  . Hypertension Father   . Colon polyps Brother     x 2  . Hypertension Mother  M. brother , father   . Colon polyps Sister   . Coronary artery disease Other     GM, aunt  . Diabetes Mother   . Hypertension Mother   . Diabetes Sister   . Heart disease Maternal Grandmother   . Diabetes Maternal Grandmother   . Breast cancer Neg Hx          Medication List       This list is accurate as of: 02/17/15  5:40 PM.  Always use your most recent med list.               meloxicam 15 MG tablet  Commonly known as:  MOBIC  Take 1 tablet (15 mg total) by mouth daily.     triamterene-hydrochlorothiazide 37.5-25 MG tablet  Commonly known as:  MAXZIDE-25  Take 1 tablet by mouth daily.           Objective:   Physical Exam BP 122/74 mmHg  Pulse 77  Temp(Src) 98.2 F (36.8 C) (Oral)  Ht 5\' 5"  (1.651 m)  Wt 271 lb 2 oz (122.981 kg)  BMI 45.12 kg/m2  SpO2 96%   LMP 01/13/2015 General:   Well developed, well nourished . NAD.  HEENT:  Normocephalic . Face symmetric, atraumatic Neck: No thyromegaly Lungs:  CTA B Normal respiratory effort, no intercostal retractions, no accessory muscle use. Heart: RRR,  no murmur.  no pretibial edema bilaterally  Abdomen:  Not distended, soft, non-tender. No rebound or rigidity. No mass,organomegaly Skin: Not pale. Not jaundice Neurologic:  alert & oriented X3.  Speech normal, gait appropriate for age and unassisted Psych--  Cognition and judgment appear intact.  Cooperative with normal attention span and concentration.  Behavior appropriate. No anxious or depressed appearing.    Assessment & Plan:   Assessment>  HTN GYN: Dr Raphael Gibney  Infertility, irregular periods, uterine fibroids, abnormal Pap smear, history of chlamydia and condyloma, H/o hyperprolactinemia 2004 and microadenomas 2012 (dx per gyn?) BTL  Seasonal allergies +FH colon ca father age 87, + colon polyps  Plan: HTN: Well control, check labs, continue present care Mild anemia: Up-to-date on colonoscopies, related to periods? Check labs RTC 8 months

## 2015-02-17 NOTE — Assessment & Plan Note (Addendum)
Tdap 2013 Flu shot - discussed, declined  + FH colon ca and polyps Cscope 03-2010, 2 adenomatous polyps Cscope 12-2013, next 3 years  Dr Suzi Roots gyn, due for a visit, has an appointment already  MMG--->2016 neg diet and exercise. Doing great, has lost 15 pounds per her own scales. Labs

## 2015-02-17 NOTE — Patient Instructions (Signed)
Get your blood work before you leave     Next visit  for a routine checkup in 8 months, fasting   (15 minutes) Please schedule an appointment at the front desk

## 2015-02-17 NOTE — Assessment & Plan Note (Signed)
HTN: Well control, check labs, continue present care Mild anemia: Up-to-date on colonoscopies, related to periods? Check labs RTC 8 months

## 2015-02-17 NOTE — Progress Notes (Signed)
Pre visit review using our clinic review tool, if applicable. No additional management support is needed unless otherwise documented below in the visit note. 

## 2015-03-03 LAB — HM PAP SMEAR: HM Pap smear: ABNORMAL

## 2015-03-04 ENCOUNTER — Ambulatory Visit (INDEPENDENT_AMBULATORY_CARE_PROVIDER_SITE_OTHER): Payer: Managed Care, Other (non HMO) | Admitting: Podiatry

## 2015-03-04 ENCOUNTER — Encounter: Payer: Self-pay | Admitting: Podiatry

## 2015-03-04 VITALS — BP 126/71 | HR 92 | Resp 16

## 2015-03-04 DIAGNOSIS — M722 Plantar fascial fibromatosis: Secondary | ICD-10-CM

## 2015-03-04 NOTE — Progress Notes (Signed)
She presents today for follow-up of her plantar fasciitis to her left heel. He states that she is approximately 90% improved. She continues all conservative therapies including anti-inflammatories and braces and night splint.  Objective: Vital signs are stable she is alert and oriented 3. Pulses are strongly palpable. She has no pain on palpation medial calcaneal tubercle of the left heel.  Assessment: Resolving plantar fasciitis left heel.  Plan: Discussed etiology pathology conservative versus surgical therapies. I encouraged her to continue all conservative therapies until she is 100% better and then to continue her medications and other conservative therapies 1 month. I will follow-up with her as needed.

## 2015-03-06 ENCOUNTER — Encounter: Payer: Self-pay | Admitting: Internal Medicine

## 2015-04-01 ENCOUNTER — Ambulatory Visit: Payer: Managed Care, Other (non HMO) | Admitting: Podiatry

## 2015-04-04 ENCOUNTER — Other Ambulatory Visit: Payer: Self-pay | Admitting: Internal Medicine

## 2015-06-16 ENCOUNTER — Emergency Department (INDEPENDENT_AMBULATORY_CARE_PROVIDER_SITE_OTHER)
Admission: EM | Admit: 2015-06-16 | Discharge: 2015-06-16 | Disposition: A | Discharge status: Home / Self Care | Payer: Managed Care, Other (non HMO) | Source: Home / Self Care | Attending: Family Medicine | Admitting: Family Medicine

## 2015-06-16 ENCOUNTER — Encounter: Payer: Self-pay | Admitting: Emergency Medicine

## 2015-06-16 DIAGNOSIS — R05 Cough: Secondary | ICD-10-CM | POA: Diagnosis not present

## 2015-06-16 DIAGNOSIS — J019 Acute sinusitis, unspecified: Secondary | ICD-10-CM

## 2015-06-16 DIAGNOSIS — R51 Headache: Secondary | ICD-10-CM | POA: Diagnosis not present

## 2015-06-16 DIAGNOSIS — R519 Headache, unspecified: Secondary | ICD-10-CM

## 2015-06-16 DIAGNOSIS — R059 Cough, unspecified: Secondary | ICD-10-CM

## 2015-06-16 MED ORDER — BENZONATATE 100 MG PO CAPS: 100.0000 mg | ORAL_CAPSULE | Freq: Three times a day (TID) | ORAL | Status: DC

## 2015-06-16 MED ORDER — AMOXICILLIN-POT CLAVULANATE 875-125 MG PO TABS: 1.0000 | ORAL_TABLET | Freq: Two times a day (BID) | ORAL | Status: DC

## 2015-06-16 NOTE — ED Notes (Signed)
Sinus pain, pressure, headache, pressure behind eyes, cough, fatigue, congestion x 5 days

## 2015-06-16 NOTE — ED Provider Notes (Signed)
CSN: [id number]     Arrival date & time 06/16/15  1007 History   First MD Initiated Contact with Patient 06/16/15 1020     Chief Complaint  Patient presents with  . Sinus Problem   (Consider location/radiation/quality/duration/timing/severity/associated sxs/prior Treatment) HPI The pt is a 45yo female presenting to [facility] with c/o gradually worsening 5 day hx of sinus pain, pressure, and congestion.  Congestion is mild to moderate in severity causing pressure behind her eyes, mild intermittent productive cough and fatigue.  She has been taking OTC cough/cold medication with minimal relief. Denies fever, chills, n/v/d. Denies difficulty breathing. No sick contacts or recent travel.   Past Medical History  Diagnosis Date  . H/O chlamydia infection 04/07/93  . Obesity   . AMA (advanced maternal age) multigravida 71+   . H/O infertility 2004  . H/O polydactyly   . Irregular periods/menstrual cycles   . Uterine fibroid 10/1999  . Hx of hyperprolactinemia 08/2002  . Microadenoma ([place]) 08/2010  . Abnormal Pap smear 1995  . 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 Anemia   . Blood transfusion 2006    [place]  . Heartburn in pregnancy   . Hypertension   . Personal history of colonic adenomas 04/21/2010  . Allergy     seasonal   Past Surgical History  Procedure Laterality Date  . Cesarean section    . Myomectomy  07/2003  . Uterine fibroid surgery  2005    myomectomy x 1  . Tubal ligation     Family History  Problem Relation Age of Onset  . Colon cancer Father 44    colon  . Hypertension Father   . Colon polyps Brother     x 2  . Hypertension Mother     M. brother , father   . Colon polyps Sister   . Coronary artery disease Other     GM, aunt  . Diabetes Mother   . Hypertension Mother   . Diabetes Sister   . Heart disease Maternal Grandmother   . Diabetes Maternal Grandmother   . Breast cancer Neg Hx    Social History  Substance Use Topics  . Smoking status: Never Smoker   .  Smokeless tobacco: Never Used  . Alcohol Use: Yes     Comment: socially   OB History    Gravida Para Term Preterm AB TAB SAB Ectopic Multiple Living   2 2 1       2      Review of Systems  Constitutional: Negative for fever and chills.  HENT: Positive for congestion, postnasal drip, rhinorrhea, sinus pressure, sore throat and voice change. Negative for ear pain and trouble swallowing.   Respiratory: Positive for cough. Negative for shortness of breath.   Cardiovascular: Negative for chest pain and palpitations.  Gastrointestinal: Negative for nausea, vomiting, abdominal pain and diarrhea.  Musculoskeletal: Positive for myalgias and arthralgias. Negative for back pain.  Skin: Negative for rash.  Neurological: Positive for headaches. Negative for dizziness and light-headedness.    Allergies  Review of patient's allergies indicates no known allergies.  Home Medications   Prior to Admission medications   Medication Sig Start Date End Date Taking? Authorizing Provider  amoxicillin-clavulanate (AUGMENTIN) 875-125 MG tablet Take 1 tablet by mouth 2 (two) times daily. One po bid x 7 days 06/16/15   Noland Fordyce, PA-C  benzonatate (TESSALON) 100 MG capsule Take 1 capsule (100 mg total) by mouth every 8 (eight) hours. 06/16/15   Noland Fordyce, PA-C  meloxicam (MOBIC) 15 MG tablet Take 1 tablet (15 mg total) by mouth daily. 07/04/14   Max T Hyatt, DPM  triamterene-hydrochlorothiazide (MAXZIDE-25) 37.5-25 MG tablet Take 1 tablet by mouth daily. 04/04/15   Colon Branch, MD   Meds Ordered and Administered this Visit  Medications - No data to display  BP 147/83 mmHg  Pulse 96  Temp(Src) 98.5 F (36.9 C) (Oral)  Ht 5\' 5"  (1.651 m)  Wt 271 lb (122.925 kg)  BMI 45.10 kg/m2  SpO2 96% No data found.   Physical Exam  Constitutional: She appears well-developed and well-nourished. No distress.  HENT:  Head: Normocephalic and atraumatic.  Right Ear: Tympanic membrane normal.  Left Ear: Tympanic  membrane normal.  Nose: Mucosal edema present. Right sinus exhibits maxillary sinus tenderness and frontal sinus tenderness. Left sinus exhibits maxillary sinus tenderness and frontal sinus tenderness.  Mouth/Throat: Uvula is midline, oropharynx is clear and moist and mucous membranes are normal.  Eyes: Conjunctivae are normal. No scleral icterus.  Neck: Normal range of motion. Neck supple.  Cardiovascular: Normal rate, regular rhythm and normal heart sounds.   Pulmonary/Chest: Effort normal and breath sounds normal. No stridor. No respiratory distress. She has no wheezes. She has no rales.  Abdominal: Soft. She exhibits no distension. There is no tenderness.  Musculoskeletal: Normal range of motion.  Lymphadenopathy:    She has no cervical adenopathy.  Neurological: She is alert.  Skin: Skin is warm and dry. She is not diaphoretic.  Nursing note and vitals reviewed.   ED Course  Procedures (including critical care time)  Labs Review Labs Reviewed - No data to display  Imaging Review No results found.   MDM   1. Acute rhinosinusitis   2. Sinus headache   3. Cough    Pt c/o gradually worsening sinus pressure with congestion, fatigue and cough for 5 days.  Exam c/w sinusitis  Rx: Augmentin and Tessalon  Advised pt to use acetaminophen and ibuprofen as needed for fever and pain. Encouraged rest and fluids. May use OTC nasal saline and Afrin as well as sinus rinses. F/u with PCP in 7-10 days if not improving, sooner if worsening. Pt verbalized understanding and agreement with tx plan.     Noland Fordyce, PA-C 06/16/15 1036

## 2015-06-16 NOTE — Discharge Instructions (Signed)
You may take 400-600mg  Ibuprofen (Motrin) every 6-8 hours for fever and pain  Alternate with Tylenol  You may take 500mg  Tylenol every 4-6 hours as needed for fever and pain  Follow-up with your primary care provider next week for recheck of symptoms if not improving.  Be sure to drink plenty of fluids and rest, at least 8hrs of sleep a night, preferably more while you are sick. Return urgent care or go to closest ER if you cannot keep down fluids/signs of dehydration, fever not reducing with Tylenol, difficulty breathing/wheezing, stiff neck, worsening condition, or other concerns (see below)  Please take antibiotics as prescribed and be sure to complete entire course even if you start to feel better to ensure infection does not come back.  You may try over the counter Afrin to help with nasal congestion.  Do not use for more than 3-4 days in a row as symptoms may worsen after that.  You may use over the counter sinus rinses and nasal saline as needed.

## 2015-06-20 ENCOUNTER — Telehealth: Payer: Self-pay

## 2015-07-14 ENCOUNTER — Telehealth: Payer: Self-pay | Admitting: *Deleted

## 2015-07-14 ENCOUNTER — Telehealth: Payer: Self-pay | Admitting: Internal Medicine

## 2015-07-14 MED ORDER — TRIAMTERENE-HCTZ 37.5-25 MG PO TABS: 1.0000 | ORAL_TABLET | Freq: Every day | ORAL | Status: DC

## 2015-07-14 MED ORDER — MELOXICAM 15 MG PO TABS: 15.0000 mg | ORAL_TABLET | Freq: Every day | ORAL | Status: DC

## 2015-07-14 NOTE — Telephone Encounter (Signed)
Pt request refill Mobic.  I refilled +1, and instructed to make an appt if not better in the next 1-2 months.  Pt agreed.

## 2015-07-14 NOTE — Telephone Encounter (Signed)
Rx sent 

## 2015-07-14 NOTE — Telephone Encounter (Signed)
Pharmacy: [id number] - [place], [place]  Reason for call: Pt needs [facility] RX [facility] for triamterene hydrochlorothiazide. She is out.

## 2015-10-21 ENCOUNTER — Ambulatory Visit: Payer: Managed Care, Other (non HMO) | Admitting: Internal Medicine

## 2015-12-18 ENCOUNTER — Encounter: Payer: Self-pay | Admitting: *Deleted

## 2015-12-18 ENCOUNTER — Emergency Department (INDEPENDENT_AMBULATORY_CARE_PROVIDER_SITE_OTHER): Payer: Managed Care, Other (non HMO)

## 2015-12-18 ENCOUNTER — Emergency Department (INDEPENDENT_AMBULATORY_CARE_PROVIDER_SITE_OTHER)
Admission: EM | Admit: 2015-12-18 | Discharge: 2015-12-18 | Disposition: A | Discharge status: Home / Self Care | Payer: Managed Care, Other (non HMO) | Source: Home / Self Care | Attending: Family Medicine | Admitting: Family Medicine

## 2015-12-18 DIAGNOSIS — R0981 Nasal congestion: Secondary | ICD-10-CM | POA: Diagnosis not present

## 2015-12-18 DIAGNOSIS — I10 Essential (primary) hypertension: Secondary | ICD-10-CM | POA: Diagnosis not present

## 2015-12-18 DIAGNOSIS — R51 Headache: Secondary | ICD-10-CM | POA: Diagnosis not present

## 2015-12-18 MED ORDER — PREDNISONE 20 MG PO TABS: 20.0000 mg | ORAL_TABLET | Freq: Two times a day (BID) | ORAL | 0 refills | Status: DC

## 2015-12-18 MED ORDER — AMOXICILLIN 875 MG PO TABS: 875.0000 mg | ORAL_TABLET | Freq: Two times a day (BID) | ORAL | 0 refills | Status: DC

## 2015-12-18 NOTE — ED Triage Notes (Signed)
Pt c/o 2-3 weeks of intermittent, worse in the evening, HA and sins pressure with post nasal drainage and change in her breath. Afebrile. Taken Claritin and Aleve Sinus otc with minimal relief.

## 2015-12-18 NOTE — Discharge Instructions (Signed)
Stop oral sinus medications. Check BP more frequently. Recommend using saline nasal spray several times daily and saline nasal irrigation (AYR is a common brand).  Use Nasacort nasal spray each morning after using Afrin nasal spray and saline nasal irrigation. Begin Amoxicillin if not improving about one week or if persistent fever develops

## 2015-12-18 NOTE — ED Provider Notes (Signed)
[facility]    CSN: [id number] Arrival date & time: 12/18/15  1421  First Provider Contact:  First MD Initiated Contact with Patient 12/18/15 1505        History   Chief Complaint Chief Complaint  Patient presents with  . Sinus Problem  . Headache    HPI Felicia Monroe is a 45 y.o. female.   Patient complains of approximately one month history of persistent sinus congestion, facial pressure and headache, not responding to Aleve Sinus tabs and Claritin.  She believes that she may have had a cold initially.  She notes that she does not usually have sinus congestion that lasts this long.   She admits that she forgot to take her BP medicine yesterday.   She has a family history of diabetes   The history is provided by the patient.  Sinus Problem  This is a recurrent problem. Episode onset: 1 month ago. The problem occurs daily. The problem has not changed since onset.Associated symptoms include headaches. Nothing aggravates the symptoms. Nothing relieves the symptoms. Treatments tried: antihistamine/decongestants. The treatment provided no relief.  Headache  Associated symptoms: congestion, drainage, sinus pressure and sore throat   Associated symptoms: no ear pain, no fatigue, no fever and no hearing loss     Past Medical History:  Diagnosis Date  . Abnormal Pap smear 1995  . Allergy    seasonal  . AMA (advanced maternal age) multigravida 33+   . Anemia   . Blood transfusion 2006   [place]  . 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 H/O chlamydia infection 04/07/93  . H/O infertility 2004  . H/O polydactyly   . Heartburn in pregnancy   . Hx of hyperprolactinemia 08/2002  . Hypertension   . Irregular periods/menstrual cycles   . Microadenoma ([place]) 08/2010  . Obesity   . Personal history of colonic adenomas 04/21/2010  . Uterine fibroid 10/1999    Patient Active Problem List   Diagnosis Date Noted  . PCP NOTES >>> 02/17/2015  . Fluttering sensation of heart  07/03/2013  . Acid reflux 07/03/2013  . Family history of colon cancer - father in 78's 03/24/2013  . Annual physical exam 10/04/2012  . HTN (hypertension) 09/14/2011  . Status post repeat low transverse cesarean section 09/11/2011  . Anemia 09/11/2011  . H/O myomectomy 08/11/2011  . Previous cesarean delivery, antepartum condition or complication [id number]  . Obesity 07/26/2011  . Back pain 07/27/2010  . Personal history of colonic adenomas 04/21/2010    Past Surgical History:  Procedure Laterality Date  . CESAREAN SECTION    . MYOMECTOMY  07/2003  . TUBAL LIGATION    . UTERINE FIBROID SURGERY  2005   myomectomy x 1    OB History    Gravida Para Term Preterm AB Living   2 2 1     2    SAB TAB Ectopic Multiple Live Births           2       Home Medications    Prior to Admission medications   Medication Sig Start Date End Date Taking? Authorizing Provider  amoxicillin (AMOXIL) 875 MG tablet Take 1 tablet (875 mg total) by mouth 2 (two) times daily. (Rx void after 12/26/15) 12/18/15   Kandra Nicolas, MD  predniSONE (DELTASONE) 20 MG tablet Take 1 tablet (20 mg total) by mouth 2 (two) times daily. Take with food. 12/18/15   Kandra Nicolas, MD  triamterene-hydrochlorothiazide (MAXZIDE-25) 37.5-25 MG tablet  Take 1 tablet by mouth daily. 07/14/15   Colon Branch, MD    Family History Family History  Problem Relation Age of Onset  . Colon cancer Father 61    colon  . Hypertension Father   . Colon polyps Brother     x 2  . Hypertension Mother     M. brother , father   . Diabetes Mother   . Colon polyps Sister   . Coronary artery disease Other     GM, aunt  . Diabetes Sister   . Heart disease Maternal Grandmother   . Diabetes Maternal Grandmother   . Breast cancer Neg Hx     Social History Social History  Substance Use Topics  . Smoking status: Never Smoker  . Smokeless tobacco: Never Used  . Alcohol use Yes     Comment: socially     Allergies   Review of  patient's allergies indicates no known allergies.   Review of Systems Review of Systems  Constitutional: Negative for activity change, appetite change, chills, diaphoresis, fatigue and fever.  HENT: Positive for congestion, postnasal drip, rhinorrhea, sinus pressure, sore throat and tinnitus. Negative for ear pain, facial swelling, hearing loss, mouth sores, nosebleeds and sneezing.   Eyes: Negative.   Respiratory: Positive for choking. Negative for stridor.   Gastrointestinal: Negative.   Genitourinary: Negative.   Musculoskeletal: Negative.   Neurological: Positive for headaches.     Physical Exam Triage Vital Signs ED Triage Vitals  Enc Vitals Group     BP 12/18/15 1441 (!) 151/110     Pulse Rate 12/18/15 1441 84     Resp --      Temp 12/18/15 1441 98.2 F (36.8 C)     Temp Source 12/18/15 1441 Oral     SpO2 12/18/15 1441 100 %     Weight 12/18/15 1441 279 lb (126.6 kg)     Height --      Head Circumference --      Peak Flow --      Pain Score 12/18/15 1442 2     Pain Loc --      Pain Edu? --      Excl. in [place]? --    No data found.   Updated Vital Signs BP (!) 151/110 (BP Location: Left Arm)   Pulse 84   Temp 98.2 F (36.8 C) (Oral)   Wt 279 lb (126.6 kg)   LMP 11/22/2015   SpO2 100%   BMI 46.43 kg/m   Visual Acuity Right Eye Distance:   Left Eye Distance:   Bilateral Distance:    Right Eye Near:   Left Eye Near:    Bilateral Near:     Physical Exam Nursing notes and Vital Signs reviewed. Appearance:  Patient appears stated age, and in no acute distress Eyes:  Pupils are equal, round, and reactive to light and accomodation.  Extraocular movement is intact.  Conjunctivae are not inflamed  Ears:  Canals normal.  Tympanic membranes normal.  Nose:  Congested turbinates.  No sinus tenderness.   Pharynx:  Normal Neck:  Supple.  No adenopathy Lungs:  Clear to auscultation.  Breath sounds are equal.  Moving air well. Heart:  Regular rate and rhythm without  murmurs, rubs, or gallops.  Abdomen:  Nontender without masses or hepatosplenomegaly.  Bowel sounds are present.  No CVA or flank tenderness.  Extremities:  No edema.  Skin:  No rash present.    UC Treatments / Results  Labs (  all labs ordered are listed, but only abnormal results are displayed) Labs Reviewed - No data to display  EKG  EKG Interpretation None       Radiology Dg Sinuses Complete  Result Date: 12/18/2015 CLINICAL DATA:  Patient c/o frontal headaches and facial pains and pressure x weeks, ? Sinusitis, no other complaints EXAM: PARANASAL SINUSES - COMPLETE 3 + VIEW COMPARISON:  None. FINDINGS: The paranasal sinus are aerated. There is no evidence of sinus opacification air-fluid levels or mucosal thickening. No significant bone abnormalities are seen. IMPRESSION: Negative. Electronically Signed   By: Lajean Manes M.D.   On: 12/18/2015 15:38    Procedures Procedures (including critical care time)  Medications Ordered in UC Medications - No data to display   Initial Impression / Assessment and Plan / UC Course  I have reviewed the triage vital signs and the nursing notes.  Pertinent labs & imaging results that were available during my care of the patient were reviewed by me and considered in my medical decision making (see chart for details).  Clinical Course   Note negative sinus films. Begin prednisone burst. Stop oral sinus medications. Check BP more frequently. Recommend using saline nasal spray several times daily and saline nasal irrigation (AYR is a common brand).  Use Nasacort nasal spray each morning after using Afrin nasal spray and saline nasal irrigation (use Afrin spray for no longer than one week). Begin Amoxicillin if not improving about one week or if persistent fever develops (Given a prescription to hold, with an expiration date)  Followup with PCP for blood pressure management; consider checking Hgb A1C.     Final Clinical Impressions(s) /  UC Diagnoses   Final diagnoses:  Sinus congestion  Essential hypertension    New Prescriptions New Prescriptions   AMOXICILLIN (AMOXIL) 875 MG TABLET    Take 1 tablet (875 mg total) by mouth 2 (two) times daily. (Rx void after 12/26/15)   PREDNISONE (DELTASONE) 20 MG TABLET    Take 1 tablet (20 mg total) by mouth 2 (two) times daily. Take with food.     Kandra Nicolas, MD 12/18/15 [phone]

## 2015-12-23 ENCOUNTER — Encounter: Payer: Self-pay | Admitting: Internal Medicine

## 2015-12-23 ENCOUNTER — Ambulatory Visit (INDEPENDENT_AMBULATORY_CARE_PROVIDER_SITE_OTHER): Payer: Managed Care, Other (non HMO) | Admitting: Internal Medicine

## 2015-12-23 VITALS — BP 144/86 | HR 72 | Temp 97.6°F | Resp 14 | Ht 65.0 in | Wt 276.0 lb

## 2015-12-23 DIAGNOSIS — Z833 Family history of diabetes mellitus: Secondary | ICD-10-CM | POA: Diagnosis not present

## 2015-12-23 DIAGNOSIS — I10 Essential (primary) hypertension: Secondary | ICD-10-CM | POA: Diagnosis not present

## 2015-12-23 DIAGNOSIS — R51 Headache: Secondary | ICD-10-CM | POA: Diagnosis not present

## 2015-12-23 DIAGNOSIS — R519 Headache, unspecified: Secondary | ICD-10-CM

## 2015-12-23 LAB — BASIC METABOLIC PANEL
BUN: 12 mg/dL (ref 6–23)
CO2: 31 mEq/L (ref 19–32)
Calcium: 8.9 mg/dL (ref 8.4–10.5)
Chloride: 103 mEq/L (ref 96–112)
Creatinine, Ser: 0.66 mg/dL (ref 0.40–1.20)
GFR: 123.39 mL/min (ref >60.00)
GLUCOSE: 98 mg/dL (ref 70–99)
POTASSIUM: 4 meq/L (ref 3.5–5.1)
SODIUM: 138 meq/L (ref 135–145)

## 2015-12-23 LAB — CBC WITH DIFFERENTIAL/PLATELET
BASOS PCT: 0.6 % (ref 0.0–3.0)
Basophils Absolute: 0 10*3/uL (ref 0.0–0.1)
EOS PCT: 2.1 % (ref 0.0–5.0)
Eosinophils Absolute: 0.1 10*3/uL (ref 0.0–0.7)
HCT: 34.6 % — ABNORMAL LOW (ref 36.0–46.0)
Hemoglobin: 11.8 g/dL — ABNORMAL LOW (ref 12.0–15.0)
LYMPHS ABS: 2.2 10*3/uL (ref 0.7–4.0)
Lymphocytes Relative: 34.7 % (ref 12.0–46.0)
MCHC: 34 g/dL (ref 30.0–36.0)
MCV: 80.7 fl (ref 78.0–100.0)
MONO ABS: 0.4 10*3/uL (ref 0.1–1.0)
Monocytes Relative: 5.6 % (ref 3.0–12.0)
NEUTROS ABS: 3.6 10*3/uL (ref 1.4–7.7)
NEUTROS PCT: 57 % (ref 43.0–77.0)
Platelets: 271 10*3/uL (ref 150.0–400.0)
RBC: 4.28 Mil/uL (ref 3.87–5.11)
RDW: 14 % (ref 11.5–15.5)
WBC: 6.4 10*3/uL (ref 4.0–10.5)

## 2015-12-23 LAB — SEDIMENTATION RATE: Sed Rate: 50 mm/hr — ABNORMAL HIGH (ref 0–20)

## 2015-12-23 LAB — LIPID PANEL
CHOLESTEROL: 126 mg/dL (ref 0–200)
HDL: 51.6 mg/dL (ref >39.00)
LDL Cholesterol: 61 mg/dL (ref 0–99)
NonHDL: 74.64
TRIGLYCERIDES: 67 mg/dL (ref 0.0–149.0)
Total CHOL/HDL Ratio: 2
VLDL: 13.4 mg/dL (ref 0.0–40.0)

## 2015-12-23 LAB — HEMOGLOBIN A1C: HEMOGLOBIN A1C: 5.4 % (ref 4.6–6.5)

## 2015-12-23 MED ORDER — AMOXICILLIN 875 MG PO TABS: 875.0000 mg | ORAL_TABLET | Freq: Two times a day (BID) | ORAL | 0 refills | Status: DC

## 2015-12-23 MED ORDER — PREDNISONE 10 MG PO TABS: ORAL_TABLET | ORAL | 0 refills | Status: DC

## 2015-12-23 NOTE — Patient Instructions (Addendum)
Rest, fluids , tylenol  For nasal congestion: Use OTC Nasocort or Flonase : 2 nasal sprays on each side of the nose in the morning until you feel better   Avoid decongestants such as  Pseudoephedrine or phenylephrine   Take the antibiotic as prescribed  (Amoxicillin) and prednisone  Call if not gradually better over the next  10 days  Call anytime if the symptoms are severe headaches  --------------------------- Check the  blood pressure 2 or 3 times a  Week   Be sure your blood pressure is between 110/65 and  145/85.  if it is consistently higher or lower, let me know  --------------------------------  Ask your eye doctor to do a funduscopy because you have headaches.   -------------------- Next visit for a physical exam in 3-4 months

## 2015-12-23 NOTE — Progress Notes (Signed)
Pre visit review using our clinic review tool, if applicable. No additional management support is needed unless otherwise documented below in the visit note. 

## 2015-12-23 NOTE — Progress Notes (Signed)
Subjective:    Patient ID: Felicia Monroe, female    DOB: April 20, 1971, 45 y.o.   MRN: [id number]  DOS:  12/23/2015 Type of visit - description : acute Interval history: About 3 weeks history of headache described as sinus, frontal pain, congestion, on and off. Symptoms have been day or night, they may last one or 2 hours, not associated with nausea. These are not the worst headache of her life. Went to urgent care, x-rays were negative, was prescribed amoxicillin and prednisone but has not take them. For the headache has been taking OTC Aleve with a decongestant daily, stopped it a week ago because she noted her BPs were elevated in the 150s    Review of Systems No fever chills No itchy nose, eyes or sneezing No cough or chest congestion No nasal discharge  Past Medical History:  Diagnosis Date  . Abnormal Pap smear 1995  . Allergy    seasonal  . AMA (advanced maternal age) multigravida 32+   . Anemia   . Blood transfusion 2006   [place]  . 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 H/O chlamydia infection 04/07/93  . H/O infertility 2004  . H/O polydactyly   . Heartburn in pregnancy   . Hx of hyperprolactinemia 08/2002  . Hypertension   . Irregular periods/menstrual cycles   . Microadenoma ([place]) 08/2010  . Obesity   . Personal history of colonic adenomas 04/21/2010  . Uterine fibroid 10/1999    Past Surgical History:  Procedure Laterality Date  . CESAREAN SECTION    . MYOMECTOMY  07/2003  . TUBAL LIGATION    . UTERINE FIBROID SURGERY  2005   myomectomy x 1    Social History   Social History  . Marital status: Married    Spouse name: N/A  . Number of children: 2  . Years of education: N/A   Occupational History  . PROJECT [employer]  .  [employer]   Social History Main Topics  . Smoking status: Never Smoker  . Smokeless tobacco: Never Used  . Alcohol use Yes     Comment: socially  . Drug use: No  . Sexual activity: Not Currently    Birth control/  protection: None     Comment: BTL   Other Topics Concern  . Not on file   Social History Narrative   household- pt, husband, 2 children 35 and 75 y/o        Medication List       Accurate as of 12/23/15  5:28 PM. Always use your most recent med list.          amoxicillin 875 MG tablet Commonly known as:  AMOXIL Take 1 tablet (875 mg total) by mouth 2 (two) times daily.   predniSONE 10 MG tablet Commonly known as:  DELTASONE 4 tablets x 2 days, 3 tabs x 2 days, 2 tabs x 2 days, 1 tab x 2 days   triamterene-hydrochlorothiazide 37.5-25 MG tablet Commonly known as:  MAXZIDE-25 Take 1 tablet by mouth daily.          Objective:   Physical Exam BP (!) 144/86 (BP Location: Left Arm, Patient Position: Sitting, Cuff Size: Normal)   Pulse 72   Temp 97.6 F (36.4 C) (Oral)   Resp 14   Ht 5\' 5"  (1.651 m)   Wt 276 lb (125.2 kg)   LMP 11/22/2015   SpO2 97%   BMI 45.93 kg/m  General:   Well developed,  well nourished . NAD.  HEENT:  Normocephalic . Face symmetric, atraumatic TMs normal, nose congested, sinuses no TTP. Throat symmetric and not red.  Funduscopy, undilated: Partially visualized on the right but  normal, poorly visualized on the left Lungs:  CTA B Normal respiratory effort, no intercostal retractions, no accessory muscle use. Heart: RRR,  no murmur.  No pretibial edema bilaterally  Skin: Not pale. Not jaundice Neurologic:  alert & oriented X3.  Speech normal, gait appropriate for age and unassisted. Motor and DTRs symmetric EOMI. Pupils equal and reactive Psych--  Cognition and judgment appear intact.  Cooperative with normal attention span and concentration.  Behavior appropriate. No anxious or depressed appearing.      Assessment & Plan:   Assessment>  HTN GYN: Dr Raphael Gibney  Infertility, irregular periods, uterine fibroids, abnormal Pap smear, history of chlamydia and condyloma, H/o hyperprolactinemia 2004 and microadenomas 2012 (dx per  gyn?) BTL  Seasonal allergies +FH colon ca father age 63, + colon polyps  Plan: HTN: Controlled? Good compliance of medication, BP slightly elevated but she has been taking decongestants. Will check a BMP, FLP, DC decongestants and continue monitoring Headaches: Obese lady with headaches, mild sinus infection/allergies? Others? Recommend to proceed with prednisone and amoxicillin, new Rx provided. I was unable to do a good funduscopy, patient will see her eye doctor on Friday and recommend to ask for a funduscopy. Check a CBC and sedimentation rate See AVS. Screenings:  request a A1c. RTC 4 months, CPX

## 2015-12-23 NOTE — Assessment & Plan Note (Signed)
HTN: Controlled? Good compliance of medication, BP slightly elevated but she has been taking decongestants. Will check a BMP, FLP, DC decongestants and continue monitoring Headaches: Obese lady with headaches, mild sinus infection/allergies? Others? Recommend to proceed with prednisone and amoxicillin, new Rx provided. I was unable to do a good funduscopy, patient will see her eye doctor on Friday and recommend to ask for a funduscopy. Check a CBC and sedimentation rate See AVS. Screenings:  request a A1c. RTC 4 months, CPX

## 2015-12-24 ENCOUNTER — Other Ambulatory Visit (INDEPENDENT_AMBULATORY_CARE_PROVIDER_SITE_OTHER): Payer: Managed Care, Other (non HMO)

## 2015-12-24 DIAGNOSIS — R51 Headache: Secondary | ICD-10-CM

## 2015-12-24 DIAGNOSIS — R519 Headache, unspecified: Secondary | ICD-10-CM

## 2015-12-24 LAB — RHEUMATOID FACTOR: Rhuematoid fact SerPl-aCnc: 13 IU/mL (ref <=14)

## 2015-12-24 NOTE — Addendum Note (Signed)
Addended byDamita Dunnings D on: 12/24/2015 01:53 PM   Modules accepted: Orders

## 2015-12-25 LAB — ANA: Anti Nuclear Antibody(ANA): NEGATIVE

## 2016-01-08 ENCOUNTER — Encounter: Payer: Self-pay | Admitting: Internal Medicine

## 2016-01-27 ENCOUNTER — Telehealth: Payer: Self-pay | Admitting: Internal Medicine

## 2016-01-27 NOTE — Telephone Encounter (Signed)
Please advise patient she is due for a repeat sedimentation rate, DX headache. Also, BP was elevated, she was having headaches. Is she doing better?

## 2016-01-27 NOTE — Telephone Encounter (Signed)
LMOM informing Pt to return call.  

## 2016-01-28 ENCOUNTER — Other Ambulatory Visit: Payer: Self-pay | Admitting: Obstetrics and Gynecology

## 2016-01-28 DIAGNOSIS — Z1231 Encounter for screening mammogram for malignant neoplasm of breast: Secondary | ICD-10-CM

## 2016-01-30 ENCOUNTER — Ambulatory Visit (HOSPITAL_BASED_OUTPATIENT_CLINIC_OR_DEPARTMENT_OTHER)
Admission: RE | Admit: 2016-01-30 | Discharge: 2016-01-30 | Disposition: A | Discharge status: Home / Self Care | Payer: Managed Care, Other (non HMO) | Source: Ambulatory Visit | Attending: Obstetrics and Gynecology | Admitting: Obstetrics and Gynecology

## 2016-01-30 DIAGNOSIS — Z1231 Encounter for screening mammogram for malignant neoplasm of breast: Secondary | ICD-10-CM | POA: Diagnosis present

## 2016-01-30 LAB — HM MAMMOGRAPHY

## 2016-01-31 ENCOUNTER — Other Ambulatory Visit: Payer: Self-pay | Admitting: Internal Medicine

## 2016-02-03 ENCOUNTER — Telehealth: Payer: Self-pay

## 2016-02-03 NOTE — Telephone Encounter (Signed)
Spoke w/ Pt, informed her that Wellness screening form has been completed and faxed to [phone]. Pt requesting original copy of form be placed at front desk for pick up. Copy of form sent for scanning.

## 2016-04-07 LAB — HM PAP SMEAR

## 2016-04-12 ENCOUNTER — Encounter: Payer: Self-pay | Admitting: Internal Medicine

## 2016-05-06 ENCOUNTER — Encounter: Payer: Self-pay | Admitting: Internal Medicine

## 2016-05-06 ENCOUNTER — Ambulatory Visit (INDEPENDENT_AMBULATORY_CARE_PROVIDER_SITE_OTHER): Payer: Managed Care, Other (non HMO) | Admitting: Internal Medicine

## 2016-05-06 VITALS — BP 128/72 | HR 65 | Temp 97.8°F | Resp 14 | Ht 65.0 in | Wt 280.4 lb

## 2016-05-06 DIAGNOSIS — Z0000 Encounter for general adult medical examination without abnormal findings: Secondary | ICD-10-CM

## 2016-05-06 DIAGNOSIS — D649 Anemia, unspecified: Secondary | ICD-10-CM | POA: Diagnosis not present

## 2016-05-06 LAB — COMPREHENSIVE METABOLIC PANEL
ALK PHOS: 58 U/L (ref 39–117)
ALT: 11 U/L (ref 0–35)
AST: 13 U/L (ref 0–37)
Albumin: 3.8 g/dL (ref 3.5–5.2)
BUN: 11 mg/dL (ref 6–23)
CO2: 31 meq/L (ref 19–32)
Calcium: 9.4 mg/dL (ref 8.4–10.5)
Chloride: 101 mEq/L (ref 96–112)
Creatinine, Ser: 0.65 mg/dL (ref 0.40–1.20)
GFR: 125.39 mL/min (ref >60.00)
GLUCOSE: 96 mg/dL (ref 70–99)
POTASSIUM: 3.7 meq/L (ref 3.5–5.1)
Sodium: 139 mEq/L (ref 135–145)
Total Bilirubin: 0.6 mg/dL (ref 0.2–1.2)
Total Protein: 7.5 g/dL (ref 6.0–8.3)

## 2016-05-06 LAB — CBC WITH DIFFERENTIAL/PLATELET
Basophils Absolute: 0 10*3/uL (ref 0.0–0.1)
Basophils Relative: 0.4 % (ref 0.0–3.0)
EOS PCT: 1.5 % (ref 0.0–5.0)
Eosinophils Absolute: 0.1 10*3/uL (ref 0.0–0.7)
HCT: 33 % — ABNORMAL LOW (ref 36.0–46.0)
Hemoglobin: 11.3 g/dL — ABNORMAL LOW (ref 12.0–15.0)
LYMPHS ABS: 2.6 10*3/uL (ref 0.7–4.0)
Lymphocytes Relative: 35.4 % (ref 12.0–46.0)
MCHC: 34.1 g/dL (ref 30.0–36.0)
MCV: 82.9 fl (ref 78.0–100.0)
MONOS PCT: 4.9 % (ref 3.0–12.0)
Monocytes Absolute: 0.4 10*3/uL (ref 0.1–1.0)
NEUTROS ABS: 4.2 10*3/uL (ref 1.4–7.7)
NEUTROS PCT: 57.8 % (ref 43.0–77.0)
Platelets: 306 10*3/uL (ref 150.0–400.0)
RBC: 3.98 Mil/uL (ref 3.87–5.11)
RDW: 13.9 % (ref 11.5–15.5)
WBC: 7.3 10*3/uL (ref 4.0–10.5)

## 2016-05-06 LAB — IRON: Iron: 37 ug/dL — ABNORMAL LOW (ref 42–145)

## 2016-05-06 LAB — TSH: TSH: 1.42 u[IU]/mL (ref 0.35–4.50)

## 2016-05-06 LAB — FERRITIN: Ferritin: 138.7 ng/mL (ref 10.0–291.0)

## 2016-05-06 MED ORDER — TRIAMTERENE-HCTZ 37.5-25 MG PO TABS: 1.0000 | ORAL_TABLET | Freq: Every day | ORAL | 3 refills | Status: DC

## 2016-05-06 NOTE — Assessment & Plan Note (Signed)
HTN: BP today is very good, no recent ambulatory BPs. Recommend ambulatory BPs, continue Maxide. Checking labs. Call if not well controlled. Headaches: Resolved. Sed  rate was a slightly elevated, no need to repeat it because she is feeling well. Toothache: No evidence of sinusitis on clinical grounds , on a zpack rx by her dentist; rec to continue working with her dentist however if the pain continue let me know. Anemia: Noted a slightly low hemoglobin recently, we are rechecking labs, denies GI symptoms, periods are  monthly and not heavy. RTC one year as long as  BPs well-controlled.

## 2016-05-06 NOTE — Patient Instructions (Signed)
  GO TO THE LAB : Get the blood work     GO TO THE FRONT DESK Schedule your next appointment for a  physical exam in one year, fasting   Check the  blood pressure 2 or 3 times a month   Be sure your blood pressure is between 110/65 and  145/85. If it is consistently higher or lower, let me know

## 2016-05-06 NOTE — Progress Notes (Signed)
Pre visit review using our clinic review tool, if applicable. No additional management support is needed unless otherwise documented below in the visit note. 

## 2016-05-06 NOTE — Assessment & Plan Note (Addendum)
Tdap 2013; Flu shot- declined  + FH colon ca and polyps Cscope 03-2010, 2 adenomatous polyps Cscope 12-2013, next 3 years  Dr Suzi Roots gyn, due for a visit, has an appointment already diet and exercise. Not doing well recently, counselor. Option of bariatric surgery clinic referral discussed. Not interested at this time. Labs: Recent FLP very good. CMP, CBC, TSH. Also iron and ferritin due to mild anemia RTC one year

## 2016-05-06 NOTE — Progress Notes (Signed)
Subjective:    Patient ID: Felicia Monroe, female    DOB: 21-Dec-1970, 46 y.o.   MRN: [id number]  DOS:  05/06/2016 Type of visit - description : cpx Interval history: Good compliance w/ BP medications, BP today is excellent.  BP Readings from Last 3 Encounters:  05/06/16 128/72  12/23/15 (!) 144/86  12/18/15 (!) 151/110   Wt Readings from Last 3 Encounters:  05/06/16 280 lb 6 oz (127.2 kg)  12/23/15 276 lb (125.2 kg)  12/18/15 279 lb (126.6 kg)     Review of Systems  Constitutional: No fever. No chills. No unexplained wt changes. No unusual sweats  HEENT:   Having dental pain for the last few days, she is concerned about pain being related to her sinus. Had a URI a few weeks ago, now only has some residual  nasal discharge but no fever, chills, sore throat. Saw her dentist, she felt like teeth were okay and prescribed a Z-Pak  no ear discharge, no facial swelling, no voice changes. No eye discharge, no eye  redness , no  intolerance to light   Respiratory: No wheezing , no  difficulty breathing. No cough , no mucus production  Cardiovascular: No CP, no leg swelling , no  Palpitations  GI: no nausea, no vomiting, no diarrhea , no  abdominal pain.  No blood in the stools. No dysphagia, no odynophagia    Endocrine: No polyphagia, no polyuria , no polydipsia  GU: No dysuria, gross hematuria, difficulty urinating. No urinary urgency, no frequency.  Musculoskeletal: No joint swellings or unusual aches or pains  Skin: No change in the color of the skin, palor , no  Rash  Allergic, immunologic: No environmental allergies , no  food allergies  Neurological: No dizziness no  syncope. No headaches. No diplopia, no slurred, no slurred speech, no motor deficits, no facial  Numbness  Hematological: No enlarged lymph nodes, no easy bruising , no unusual bleedings  Psychiatry: No suicidal ideas, no hallucinations, no beavior problems, no confusion.  No unusual/severe anxiety,  no depression  Past Medical History:  Diagnosis Date  . Abnormal Pap smear 1995  . Allergy    seasonal  . AMA (advanced maternal age) multigravida 72+   . Anemia   . Blood transfusion 2006   [place]  . 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 H/O chlamydia infection 04/07/93  . H/O infertility 2004  . H/O polydactyly   . Heartburn in pregnancy   . Hx of hyperprolactinemia 08/2002  . Hypertension   . Irregular periods/menstrual cycles   . Microadenoma ([place]) 08/2010  . Obesity   . Personal history of colonic adenomas 04/21/2010  . Uterine fibroid 10/1999    Past Surgical History:  Procedure Laterality Date  . CESAREAN SECTION    . MYOMECTOMY  07/2003  . TUBAL LIGATION    . UTERINE FIBROID SURGERY  2005   myomectomy x 1    Social History   Social History  . Marital status: Married    Spouse name: N/A  . Number of children: 2  . Years of education: N/A   Occupational History  . 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 Smoking status: Never Smoker  . Smokeless tobacco: Never Used  . Alcohol use Yes     Comment: socially  . Drug use: No  . Sexual activity: Not Currently    Birth control/ protection: None     Comment: BTL  Other Topics Concern  . Not on file   Social History Narrative   household- pt, husband    2 children  2006, 2013    Family History  Problem Relation Age of Onset  . Colon cancer Father 2    colon  . Hypertension Father   . Colon polyps Brother     x 2  . Hypertension Mother     M. brother , father   . Diabetes Mother   . Colon polyps Sister   . Coronary artery disease Other     GM, aunt  . Diabetes Sister   . Heart disease Maternal Grandmother   . Diabetes Maternal Grandmother   . Breast cancer Neg Hx       Allergies as of 05/06/2016   No Known Allergies     Medication List       Accurate as of 05/06/16  4:47 PM. Always use your most recent med list.          azithromycin 250 MG tablet Commonly known as:   ZITHROMAX Take 250 mg by mouth daily.   triamterene-hydrochlorothiazide 37.5-25 MG tablet Commonly known as:  MAXZIDE-25 Take 1 tablet by mouth daily.          Objective:   Physical Exam BP 128/72 (BP Location: Left Arm, Patient Position: Sitting, Cuff Size: Normal)   Pulse 65   Temp 97.8 F (36.6 C) (Oral)   Resp 14   Ht 5\' 5"  (1.651 m)   Wt 280 lb 6 oz (127.2 kg)   LMP 03/29/2016 (Exact Date)   SpO2 98%   BMI 46.66 kg/m   General:   Well developed, well nourished . NAD.  Neck: No  thyromegaly  HEENT:  Normocephalic . Face symmetric, atraumatic. I tap her teeth, she was a slightly tender at the left upper molars Lungs:  CTA B Normal respiratory effort, no intercostal retractions, no accessory muscle use. Heart: RRR,  no murmur.  No pretibial edema bilaterally  Abdomen:  Not distended, soft, non-tender. No rebound or rigidity.   Skin: Exposed areas without rash. Not pale. Not jaundice Neurologic:  alert & oriented X3.  Speech normal, gait appropriate for age and unassisted Strength symmetric and appropriate for age.  Psych: Cognition and judgment appear intact.  Cooperative with normal attention span and concentration.  Behavior appropriate. No anxious or depressed appearing.    Assessment & Plan:    Assessment>  HTN GYN: Dr Raphael Gibney  Infertility, irregular periods, uterine fibroids, abnormal Pap smear, history of chlamydia and condyloma, H/o hyperprolactinemia 2004 and microadenomas 2012 (dx per gyn?) BTL  Seasonal allergies +FH colon ca father age 22, + colon polyps  Plan: HTN: BP today is very good, no recent ambulatory BPs. Recommend ambulatory BPs, continue Maxide. Checking labs. Call if not well controlled. Headaches: Resolved. Sed  rate was a slightly elevated, no need to repeat it because she is feeling well. Toothache: No evidence of sinusitis on clinical grounds , on a zpack rx by her dentist; rec to continue working with her dentist however if  the pain continue let me know. Anemia: Noted a slightly low hemoglobin recently, we are rechecking labs, denies GI symptoms, periods are  monthly and not heavy. RTC one year as long as  BPs well-controlled.

## 2017-03-08 ENCOUNTER — Other Ambulatory Visit: Payer: Self-pay | Admitting: Obstetrics and Gynecology

## 2017-03-08 DIAGNOSIS — Z1231 Encounter for screening mammogram for malignant neoplasm of breast: Secondary | ICD-10-CM

## 2017-03-09 ENCOUNTER — Ambulatory Visit (INDEPENDENT_AMBULATORY_CARE_PROVIDER_SITE_OTHER): Payer: Managed Care, Other (non HMO)

## 2017-03-09 DIAGNOSIS — Z1231 Encounter for screening mammogram for malignant neoplasm of breast: Secondary | ICD-10-CM | POA: Diagnosis not present

## 2017-03-09 LAB — HM MAMMOGRAPHY

## 2017-03-22 ENCOUNTER — Encounter: Payer: Self-pay | Admitting: Internal Medicine

## 2017-03-28 ENCOUNTER — Telehealth: Payer: Self-pay | Admitting: Internal Medicine

## 2017-03-28 DIAGNOSIS — Z0279 Encounter for issue of other medical certificate: Secondary | ICD-10-CM

## 2017-03-28 NOTE — Telephone Encounter (Signed)
Patient drop off Wellness Form to be completed and faxed to the number on the form. Form was placed in the front desk tray.

## 2017-03-29 NOTE — Telephone Encounter (Signed)
Completed as much as possible, pt last Lipid panel [12/23/15]; forwarded to provider/SLS 12/04

## 2017-03-30 NOTE — Telephone Encounter (Signed)
Per PCP okay to use last lipid panel and place date on form. Form signed and faxed to [facility] at [phone]. Form sent for scanning. Received fax confirmation.

## 2017-04-11 LAB — HM PAP SMEAR

## 2017-04-14 ENCOUNTER — Encounter: Payer: Self-pay | Admitting: Internal Medicine

## 2017-05-13 ENCOUNTER — Encounter: Payer: Self-pay | Admitting: Internal Medicine

## 2017-05-13 ENCOUNTER — Telehealth: Payer: Self-pay | Admitting: Internal Medicine

## 2017-05-13 ENCOUNTER — Ambulatory Visit (INDEPENDENT_AMBULATORY_CARE_PROVIDER_SITE_OTHER): Payer: Managed Care, Other (non HMO) | Admitting: Internal Medicine

## 2017-05-13 VITALS — BP 126/74 | HR 77 | Temp 97.7°F | Resp 14 | Ht 65.0 in | Wt 285.2 lb

## 2017-05-13 DIAGNOSIS — Z0000 Encounter for general adult medical examination without abnormal findings: Secondary | ICD-10-CM | POA: Diagnosis not present

## 2017-05-13 LAB — LIPID PANEL
CHOL/HDL RATIO: 2
Cholesterol: 117 mg/dL (ref 0–200)
HDL: 53 mg/dL (ref >39.00)
LDL Cholesterol: 54 mg/dL (ref 0–99)
NonHDL: 63.81
Triglycerides: 47 mg/dL (ref 0.0–149.0)
VLDL: 9.4 mg/dL (ref 0.0–40.0)

## 2017-05-13 LAB — COMPREHENSIVE METABOLIC PANEL
ALT: 10 U/L (ref 0–35)
AST: 14 U/L (ref 0–37)
Albumin: 4.1 g/dL (ref 3.5–5.2)
Alkaline Phosphatase: 76 U/L (ref 39–117)
BILIRUBIN TOTAL: 0.6 mg/dL (ref 0.2–1.2)
BUN: 14 mg/dL (ref 6–23)
CO2: 31 meq/L (ref 19–32)
CREATININE: 0.64 mg/dL (ref 0.40–1.20)
Calcium: 9.4 mg/dL (ref 8.4–10.5)
Chloride: 102 mEq/L (ref 96–112)
GFR: 127.1 mL/min (ref >60.00)
Glucose, Bld: 98 mg/dL (ref 70–99)
Potassium: 3.6 mEq/L (ref 3.5–5.1)
Sodium: 139 mEq/L (ref 135–145)
TOTAL PROTEIN: 7.8 g/dL (ref 6.0–8.3)

## 2017-05-13 LAB — CBC WITH DIFFERENTIAL/PLATELET
BASOS ABS: 0.1 10*3/uL (ref 0.0–0.1)
Basophils Relative: 1 % (ref 0.0–3.0)
Eosinophils Absolute: 0.1 10*3/uL (ref 0.0–0.7)
Eosinophils Relative: 2 % (ref 0.0–5.0)
HCT: 37.1 % (ref 36.0–46.0)
HEMOGLOBIN: 12.3 g/dL (ref 12.0–15.0)
LYMPHS ABS: 2 10*3/uL (ref 0.7–4.0)
Lymphocytes Relative: 31.8 % (ref 12.0–46.0)
MCHC: 33.2 g/dL (ref 30.0–36.0)
MCV: 81.5 fl (ref 78.0–100.0)
MONOS PCT: 4.3 % (ref 3.0–12.0)
Monocytes Absolute: 0.3 10*3/uL (ref 0.1–1.0)
NEUTROS PCT: 60.9 % (ref 43.0–77.0)
Neutro Abs: 3.9 10*3/uL (ref 1.4–7.7)
Platelets: 309 10*3/uL (ref 150.0–400.0)
RBC: 4.55 Mil/uL (ref 3.87–5.11)
RDW: 14.1 % (ref 11.5–15.5)
WBC: 6.3 10*3/uL (ref 4.0–10.5)

## 2017-05-13 LAB — TSH: TSH: 1.73 u[IU]/mL (ref 0.35–4.50)

## 2017-05-13 NOTE — Progress Notes (Signed)
Subjective:    Patient ID: Felicia Monroe, female    DOB: December 03, 1970, 47 y.o.   MRN: [id number]  DOS:  05/13/2017 Type of visit - description : CPX Interval history: No major concerns, has visits with gynecology regularly. Wt Readings from Last 3 Encounters:  05/13/17 285 lb 4 oz (129.4 kg)  05/06/16 280 lb 6 oz (127.2 kg)  12/23/15 276 lb (125.2 kg)     Review of Systems Periods last 5 days, heavy the first 3 days.  They are regular, every month.  Did have some spotting , already eval by gynecology  Other than above, a 14 point review of systems is negative     Past Medical History:  Diagnosis Date  . Abnormal Pap smear 1995  . Allergy    seasonal  . AMA (advanced maternal age) multigravida 64+   . Anemia   . Blood transfusion 2006   [place]  . 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 H/O chlamydia infection 04/07/93  . H/O infertility 2004  . H/O polydactyly   . Heartburn in pregnancy   . Hx of hyperprolactinemia 08/2002  . Hypertension   . Irregular periods/menstrual cycles   . Microadenoma ([place]) 08/2010  . Obesity   . Personal history of colonic adenomas 04/21/2010  . Uterine fibroid 10/1999    Past Surgical History:  Procedure Laterality Date  . CESAREAN SECTION    . MYOMECTOMY  07/2003  . TUBAL LIGATION    . UTERINE FIBROID SURGERY  2005   myomectomy x 1    Social History   Socioeconomic History  . Marital status: Married    Spouse name: Not on file  . Number of children: 2  . Years of education: Not on file  . Highest education level: Not on file  Social Needs  . Financial resource strain: Not on file  . Food insecurity - worry: Not on file  . Food insecurity - inability: Not on file  . Transportation needs - medical: Not on file  . Transportation needs - non-medical: Not on file  Occupational History  . 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 Smoking status: Never Smoker  . Smokeless tobacco: Never Used  Substance and Sexual Activity    . Alcohol use: Yes    Comment: socially  . Drug use: No  . Sexual activity: Not Currently    Birth control/protection: None    Comment: BTL  Other Topics Concern  . Not on file  Social History Narrative   household- pt, husband, children   Two children  2006, 2013     Family History  Problem Relation Age of Onset  . Colon cancer Father 58          . Hypertension Father   . Colon polyps Brother        x 2  . Hypertension Mother   . Diabetes Mother   . Colon polyps Sister   . Coronary artery disease Other        GM, aunt  . Diabetes Sister   . Heart disease Maternal Grandmother   . Diabetes Maternal Grandmother   . Breast cancer Neg Hx      Allergies as of 05/13/2017   No Known Allergies     Medication List        Accurate as of 05/13/17  1:06 PM. Always use your most recent med list.          triamterene-hydrochlorothiazide 37.5-25 MG  tablet Commonly known as:  MAXZIDE-25 Take 1 tablet by mouth daily.          Objective:   Physical Exam BP 126/74 (BP Location: Left Wrist, Patient Position: Sitting, Cuff Size: Small)   Pulse 77   Temp 97.7 F (36.5 C) (Oral)   Resp 14   Ht 5\' 5"  (1.651 m)   Wt 285 lb 4 oz (129.4 kg)   SpO2 97%   BMI 47.47 kg/m  General:   Well developed, morbidly obese appearing female in no distress Neck: No  thyromegaly  HEENT:  Normocephalic . Face symmetric, atraumatic Lungs:  CTA B Normal respiratory effort, no intercostal retractions, no accessory muscle use. Heart: RRR,  no murmur.  No pretibial edema bilaterally  Abdomen:  Not distended, soft, non-tender. No rebound or rigidity.   Skin: Exposed areas without rash. Not pale. Not jaundice Neurologic:  alert & oriented X3.  Speech normal, gait omitted by BMI  strength symmetric and appropriate for age.  Psych: Cognition and judgment appear intact.  Cooperative with normal attention span and concentration.  Behavior appropriate. No anxious or depressed  appearing.     Assessment & Plan:   Assessment>  HTN GYN: Dr Raphael Gibney  Infertility, irregular periods, uterine fibroids, abnormal Pap smear, history of chlamydia and condyloma, H/o hyperprolactinemia 2004 and microadenomas 2012 (dx per gyn?) BTL  Seasonal allergies +FH colon ca father age 63, + colon polyps  Plan: HTN seems well controlled on Maxide.  No change DUB: is having some unusual bleeding, already saw gynecology, they are planning further eval. H/o  prolactinemia: Reports that her gynecologist is planning to do some "pituitary testing".  Patient denies headaches, nausea or vomiting. RTC 1 year

## 2017-05-13 NOTE — Progress Notes (Signed)
Pre visit review using our clinic review tool, if applicable. No additional management support is needed unless otherwise documented below in the visit note. 

## 2017-05-13 NOTE — Assessment & Plan Note (Addendum)
Tdap 2013; Flu shot- declined today , benefits discussed - CCS (+)  FH colon ca and polyps Cscope 03-2010, 2 adenomatous polyps Cscope 12-2013, next was due 2018, declined a referral, states she will call GI -Female care: Sees gyn , last OV 03-2017, had a PAP-MMG then -diet discussed, I actually recommend to seek help from one of the bariatric doctors in town.  Exercise okay but avoid excessive walking to protect her knees. -Labs, CMP, FLP, CBC, TSH

## 2017-05-13 NOTE — Telephone Encounter (Signed)
Physical form completed, signed and faxed to [facility] at [phone]. Form sent for scanning.

## 2017-05-13 NOTE — Assessment & Plan Note (Signed)
HTN seems well controlled on Maxide.  No change DUB: is having some unusual bleeding, already saw gynecology, they are planning further eval. H/o  prolactinemia: Reports that her gynecologist is planning to do some "pituitary testing".  Patient denies headaches, nausea or vomiting. RTC 1 year

## 2017-05-13 NOTE — Telephone Encounter (Signed)
Patient needed Wellness Form filled out and Fax over to [facility]-. Patient also took a copy in the room on appointment date as well of CPE. Please fax over per number on paper.

## 2017-05-13 NOTE — Patient Instructions (Signed)
GO TO THE LAB : Get the blood work     GO TO THE FRONT DESK Schedule your next appointment for a physical exam in 1 year, fasting   Check the  blood pressure   monthly  Be sure your blood pressure is between 110/65 and  135/85. If it is consistently higher or lower, let me know  Consider see one of the bariatric doctors in town

## 2017-05-17 ENCOUNTER — Encounter: Payer: Self-pay | Admitting: Internal Medicine

## 2017-05-30 ENCOUNTER — Other Ambulatory Visit: Payer: Self-pay

## 2017-05-30 MED ORDER — TRIAMTERENE-HCTZ 37.5-25 MG PO TABS: 1.0000 | ORAL_TABLET | Freq: Every day | ORAL | 3 refills | Status: DC

## 2017-06-20 ENCOUNTER — Emergency Department (INDEPENDENT_AMBULATORY_CARE_PROVIDER_SITE_OTHER)
Admission: EM | Admit: 2017-06-20 | Discharge: 2017-06-20 | Disposition: A | Discharge status: Home / Self Care | Payer: Managed Care, Other (non HMO) | Source: Home / Self Care | Attending: Family Medicine | Admitting: Family Medicine

## 2017-06-20 ENCOUNTER — Other Ambulatory Visit: Payer: Self-pay

## 2017-06-20 DIAGNOSIS — J020 Streptococcal pharyngitis: Secondary | ICD-10-CM | POA: Diagnosis not present

## 2017-06-20 LAB — POCT RAPID STREP A (OFFICE): Rapid Strep A Screen: POSITIVE — AB

## 2017-06-20 MED ORDER — PREDNISONE 20 MG PO TABS: ORAL_TABLET | ORAL | 0 refills | Status: DC

## 2017-06-20 MED ORDER — AMOXICILLIN 875 MG PO TABS: 875.0000 mg | ORAL_TABLET | Freq: Two times a day (BID) | ORAL | 0 refills | Status: DC

## 2017-06-20 NOTE — Discharge Instructions (Signed)
May take Tylenol as needed for pain. Try warm salt water gargles for sore throat.  Stop all antihistamines for now, and other non-prescription cough/cold preparations.

## 2017-06-20 NOTE — ED Triage Notes (Signed)
Started Saturday evening with left side swollen lymph nodes, and burning in her throat.  Yesterday and today painful to swallow.  Nasal congestion and fever yesterday.

## 2017-06-20 NOTE — ED Provider Notes (Signed)
[facility]    CSN: [id number] Arrival date & time: 06/20/17  1215     History   Chief Complaint Chief Complaint  Patient presents with  . Sore Throat  . Fever    HPI MAREESA Monroe is a 47 y.o. female.   Yesterday patient developed sore throat, fatigue, chills/fever, fatigue, and mild sinus congestion with post-nasal drainage.  No cough.   The history is provided by the patient.    Past Medical History:  Diagnosis Date  . Abnormal Pap smear 1995  . Allergy    seasonal  . AMA (advanced maternal age) multigravida 71+   . Anemia   . Blood transfusion 2006   [place]  . 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 H/O chlamydia infection 04/07/93  . H/O infertility 2004  . H/O polydactyly   . Heartburn in pregnancy   . Hx of hyperprolactinemia 08/2002  . Hypertension   . Irregular periods/menstrual cycles   . Microadenoma ([place]) 08/2010  . Obesity   . Personal history of colonic adenomas 04/21/2010  . Uterine fibroid 10/1999    Patient Active Problem List   Diagnosis Date Noted  . PCP NOTES >>> 02/17/2015  . Fluttering sensation of heart 07/03/2013  . Acid reflux 07/03/2013  . Family history of colon cancer - father in 63's 03/24/2013  . Annual physical exam 10/04/2012  . HTN (hypertension) 09/14/2011  . Status post repeat low transverse cesarean section 09/11/2011  . Anemia 09/11/2011  . H/O myomectomy 08/11/2011  . Previous cesarean delivery, antepartum condition or complication 44/06/4740  . Obesity 07/26/2011  . Back pain 07/27/2010  . Personal history of colonic adenomas 04/21/2010    Past Surgical History:  Procedure Laterality Date  . CESAREAN SECTION    . MYOMECTOMY  07/2003  . TUBAL LIGATION    . UTERINE FIBROID SURGERY  2005   myomectomy x 1    OB History    Gravida Para Term Preterm AB Living   2 2 1     2    SAB TAB Ectopic Multiple Live Births           2       Home Medications    Prior to Admission medications   Medication Sig  Start Date End Date Taking? Authorizing Provider  amoxicillin (AMOXIL) 875 MG tablet Take 1 tablet (875 mg total) by mouth 2 (two) times daily. 06/20/17   Kandra Nicolas, MD  predniSONE (DELTASONE) 20 MG tablet Take one tab by mouth twice daily for 4 days, then one daily. Take with food. 06/20/17   Kandra Nicolas, MD  triamterene-hydrochlorothiazide (MAXZIDE-25) 37.5-25 MG tablet Take 1 tablet by mouth daily. 05/30/17   Colon Branch, MD    Family History Family History  Problem Relation Age of Onset  . Colon cancer Father 9          . Hypertension Father   . Colon polyps Brother        x 2  . Hypertension Mother   . Diabetes Mother   . Colon polyps Sister   . Coronary artery disease Other        GM, aunt  . Diabetes Sister   . Heart disease Maternal Grandmother   . Diabetes Maternal Grandmother   . Breast cancer Neg Hx     Social History Social History   Tobacco Use  . Smoking status: Never Smoker  . Smokeless tobacco: Never Used  Substance Use Topics  .  Alcohol use: Yes    Comment: socially  . Drug use: No     Allergies   Patient has no known allergies.   Review of Systems Review of Systems + sore throat No cough No pleuritic pain No wheezing + nasal congestion + post-nasal drainage No sinus pain/pressure No itchy/red eyes No earache No hemoptysis No SOB + fever, + chills No nausea No vomiting No abdominal pain No diarrhea No urinary symptoms No skin rash + fatigue No myalgias No headache Used OTC meds without relief   Physical Exam Triage Vital Signs ED Triage Vitals [06/20/17 1355]  Enc Vitals Group     BP 136/66     Pulse Rate 100     Resp      Temp 97.7 F (36.5 C)     Temp Source Oral     SpO2 97 %     Weight 283 lb (128.4 kg)     Height 5\' 5"  (1.651 m)     Head Circumference      Peak Flow      Pain Score 7     Pain Loc      Pain Edu?      Excl. in [place]?    No data found.  Updated Vital Signs BP 136/66 (BP Location: Right  Arm)   Pulse 100   Temp 97.7 F (36.5 C) (Oral)   Ht 5\' 5"  (1.651 m)   Wt 283 lb (128.4 kg)   LMP 06/11/2017   SpO2 97%   BMI 47.09 kg/m   Visual Acuity Right Eye Distance:   Left Eye Distance:   Bilateral Distance:    Right Eye Near:   Left Eye Near:    Bilateral Near:     Physical Exam Nursing notes and Vital Signs reviewed. Appearance:  Patient appears stated age, and in no acute distress Eyes:  Pupils are equal, round, and reactive to light and accomodation.  Extraocular movement is intact.  Conjunctivae are not inflamed  Ears:  Canals normal.  Tympanic membranes normal.  Nose:  Mildly congested turbinates.  No sinus tenderness.  Pharynx:  Erythematous and slightly swollen without obstruction.  Neck:  Supple.  Tender enlarged tonsillar nodes.  Lungs:  Clear to auscultation.  Breath sounds are equal.  Moving air well. Heart:  Regular rate and rhythm without murmurs, rubs, or gallops.    Skin:  No rash present.    UC Treatments / Results  Labs (all labs ordered are listed, but only abnormal results are displayed) Labs Reviewed  POCT RAPID STREP A (OFFICE) - Abnormal; Notable for the following components:      Result Value   Rapid Strep A Screen Positive (*)    All other components within normal limits    EKG  EKG Interpretation None       Radiology No results found.  Procedures Procedures (including critical care time)  Medications Ordered in UC Medications - No data to display   Initial Impression / Assessment and Plan / UC Course  I have reviewed the triage vital signs and the nursing notes.  Pertinent labs & imaging results that were available during my care of the patient were reviewed by me and considered in my medical decision making (see chart for details).    Begin amoxicillin, and prednisone burst/taper. May take Tylenol as needed for pain. Try warm salt water gargles for sore throat.  Stop all antihistamines for now, and other  non-prescription cough/cold preparations. Followup with Family Doctor  if not improved in about 10 days.  Final Clinical Impressions(s) / UC Diagnoses   Final diagnoses:  Strep pharyngitis    ED Discharge Orders        Ordered    amoxicillin (AMOXIL) 875 MG tablet  2 times daily     06/20/17 1439    predniSONE (DELTASONE) 20 MG tablet     06/20/17 1439          Kandra Nicolas, MD 06/20/17 1445

## 2017-07-06 ENCOUNTER — Other Ambulatory Visit: Payer: Self-pay

## 2017-07-06 ENCOUNTER — Ambulatory Visit (AMBULATORY_SURGERY_CENTER): Payer: Self-pay | Admitting: *Deleted

## 2017-07-06 ENCOUNTER — Encounter: Payer: Self-pay | Admitting: Internal Medicine

## 2017-07-06 VITALS — Ht 65.5 in | Wt 280.0 lb

## 2017-07-06 DIAGNOSIS — Z800 Family history of malignant neoplasm of digestive organs: Secondary | ICD-10-CM

## 2017-07-06 DIAGNOSIS — Z86010 Personal history of colonic polyps: Secondary | ICD-10-CM

## 2017-07-06 NOTE — Progress Notes (Signed)
No egg or soy allergy  Registered in [place]  No anesthesia or intubation problems per pt  No diet medications taken  No hx of sleep apnea or home oxygen use

## 2017-07-19 ENCOUNTER — Other Ambulatory Visit: Payer: Self-pay

## 2017-07-19 ENCOUNTER — Ambulatory Visit (AMBULATORY_SURGERY_CENTER): Payer: Managed Care, Other (non HMO) | Admitting: Internal Medicine

## 2017-07-19 ENCOUNTER — Encounter: Payer: Self-pay | Admitting: Internal Medicine

## 2017-07-19 VITALS — BP 143/68 | HR 73 | Temp 98.0°F | Resp 13 | Ht 65.5 in | Wt 280.0 lb

## 2017-07-19 DIAGNOSIS — Z800 Family history of malignant neoplasm of digestive organs: Secondary | ICD-10-CM

## 2017-07-19 DIAGNOSIS — Z86010 Personal history of colonic polyps: Secondary | ICD-10-CM

## 2017-07-19 MED ORDER — SODIUM CHLORIDE 0.9 % IV SOLN: 500.0000 mL | Freq: Once | INTRAVENOUS | Status: DC

## 2017-07-19 NOTE — Progress Notes (Signed)
Pt's states no medical or surgical changes since previsit or office visit. 

## 2017-07-19 NOTE — Op Note (Signed)
[facility] Patient Name: Felicia Monroe Procedure Date: 07/19/2017 11:41 AM MRN: [id number] Endoscopist: Gatha Mayer , MD Age: 47 Referring MD:  Date of Birth: 07-Apr-1971 Gender: Female Account #: [id number] Procedure:                Colonoscopy Indications:              Surveillance: Personal history of adenomatous                            polyps on last colonoscopy > 3 years ago Medicines:                Propofol per Anesthesia, Monitored Anesthesia Care Procedure:                Pre-Anesthesia Assessment:                           - Prior to the procedure, a History and Physical                            was performed, and patient medications and                            allergies were reviewed. The patient's tolerance of                            previous anesthesia was also reviewed. The risks                            and benefits of the procedure and the sedation                            options and risks were discussed with the patient.                            All questions were answered, and informed consent                            was obtained. Prior Anticoagulants: The patient has                            taken no previous anticoagulant or antiplatelet                            agents. ASA Grade Assessment: II - A patient with                            mild systemic disease. After reviewing the risks                            and benefits, the patient was deemed in                            satisfactory condition to undergo the procedure.  After obtaining informed consent, the colonoscope                            was passed under direct vision. Throughout the                            procedure, the patient's blood pressure, pulse, and                            oxygen saturations were monitored continuously. The                            Colonoscope was introduced through the anus and    advanced to the the cecum, identified by                            appendiceal orifice and ileocecal valve. The                            colonoscopy was performed without difficulty. The                            patient tolerated the procedure well. The quality                            of the bowel preparation was good. The bowel                            preparation used was Miralax. The ileocecal valve,                            appendiceal orifice, and rectum were photographed. Scope In: 11:48:38 AM Scope Out: 12:01:43 PM Scope Withdrawal Time: 0 hours 10 minutes 38 seconds  Total Procedure Duration: 0 hours 13 minutes 5 seconds  Findings:                 The entire examined colon appeared normal on direct                            and retroflexion views. Complications:            No immediate complications. Estimated Blood Loss:     Estimated blood loss: none. Impression:               - The entire examined colon is normal on direct and                            retroflexion views.                           - No specimens collected.                           - Personal history of colonic polyps and father had  CRCA 50's Recommendation:           - Patient has a contact number available for                            emergencies. The signs and symptoms of potential                            delayed complications were discussed with the                            patient. Return to normal activities tomorrow.                            Written discharge instructions were provided to the                            patient.                           - Resume previous diet.                           - Continue present medications.                           - Repeat colonoscopy in 5 years for surveillance. Gatha Mayer, MD 07/19/2017 12:06:13 PM This report has been signed electronically.

## 2017-07-19 NOTE — Progress Notes (Signed)
Spontaneous respirations throughout. VSS. Resting comfortably. To PACU on room air. Report to  RN. 

## 2017-07-19 NOTE — Patient Instructions (Addendum)
   No polyps today! Your next routine colonoscopy should be in 5 years - 2024.  I appreciate the opportunity to care for you. Gatha Mayer, MD, FACG YOU HAD AN ENDOSCOPIC PROCEDURE TODAY AT [facility]:   Refer to the procedure report that was given to you for any specific questions about what was found during the examination.  If the procedure report does not answer your questions, please call your gastroenterologist to clarify.  If you requested that your care partner not be given the details of your procedure findings, then the procedure report has been included in a sealed envelope for you to review at your convenience later.  YOU SHOULD EXPECT: Some feelings of bloating in the abdomen. Passage of more gas than usual.  Walking can help get rid of the air that was put into your GI tract during the procedure and reduce the bloating. If you had a lower endoscopy (such as a colonoscopy or flexible sigmoidoscopy) you may notice spotting of blood in your stool or on the toilet paper. If you underwent a bowel prep for your procedure, you may not have a normal bowel movement for a few days.  Please Note:  You might notice some irritation and congestion in your nose or some drainage.  This is from the oxygen used during your procedure.  There is no need for concern and it should clear up in a day or so.  SYMPTOMS TO REPORT IMMEDIATELY:   Following lower endoscopy (colonoscopy or flexible sigmoidoscopy):  Excessive amounts of blood in the stool  Significant tenderness or worsening of abdominal pains  Swelling of the abdomen that is new, acute  Fever of 100F or higher  For urgent or emergent issues, a gastroenterologist can be reached at any hour by calling [phone].   DIET:  We do recommend a small meal at first, but then you may proceed to your regular diet.  Drink plenty of fluids but you should avoid alcoholic beverages for 24 hours.  MEDICATIONS: Continue  present medications.  ACTIVITY:  You should plan to take it easy for the rest of today and you should NOT DRIVE or use heavy machinery until tomorrow (because of the sedation medicines used during the test).    FOLLOW UP: Our staff will call the number listed on your records the next business day following your procedure to check on you and address any questions or concerns that you may have regarding the information given to you following your procedure. If we do not reach you, we will leave a message.  However, if you are feeling well and you are not experiencing any problems, there is no need to return our call.  We will assume that you have returned to your regular daily activities without incident.  If any biopsies were taken you will be contacted by phone or by letter within the next 1-3 weeks.  Please call us at [phone] if you have not heard about the biopsies in 3 weeks.   Thank you for allowing [place] to provide for your healthcare needs today.  SIGNATURES/CONFIDENTIALITY: You and/or your care partner have signed paperwork which will be entered into your electronic medical record.  These signatures attest to the fact that that the information above on your After Visit Summary has been reviewed and is understood.  Full responsibility of the confidentiality of this discharge information lies with you and/or your care-partner.

## 2017-07-20 ENCOUNTER — Other Ambulatory Visit: Payer: Self-pay | Admitting: Obstetrics and Gynecology

## 2017-07-20 ENCOUNTER — Telehealth: Payer: Self-pay | Admitting: *Deleted

## 2017-07-20 NOTE — Telephone Encounter (Signed)
  Follow up Call-  Call back number 07/19/2017  Post procedure Call Back phone  # [phone]  Permission to leave phone message Yes  Some recent data might be hidden     Patient questions:  Do you have a fever, pain , or abdominal swelling? No. Pain Score  0 *  Have you tolerated food without any problems? Yes.    Have you been able to return to your normal activities? Yes.    Do you have any questions about your discharge instructions: Diet   No. Medications  No. Follow up visit  No.  Do you have questions or concerns about your Care? No.  Actions: * If pain score is 4 or above: No action needed, pain <4.

## 2017-08-03 ENCOUNTER — Ambulatory Visit (INDEPENDENT_AMBULATORY_CARE_PROVIDER_SITE_OTHER): Payer: Managed Care, Other (non HMO) | Admitting: Internal Medicine

## 2017-08-03 ENCOUNTER — Encounter: Payer: Self-pay | Admitting: Internal Medicine

## 2017-08-03 VITALS — BP 126/78 | HR 78 | Temp 98.2°F | Resp 16 | Ht 65.0 in | Wt 288.5 lb

## 2017-08-03 DIAGNOSIS — M25512 Pain in left shoulder: Secondary | ICD-10-CM

## 2017-08-03 MED ORDER — MELOXICAM 15 MG PO TABS: 15.0000 mg | ORAL_TABLET | Freq: Every day | ORAL | 0 refills | Status: DC

## 2017-08-03 NOTE — Progress Notes (Signed)
Pre visit review using our clinic review tool, if applicable. No additional management support is needed unless otherwise documented below in the visit note. 

## 2017-08-03 NOTE — Patient Instructions (Signed)
Take meloxicam daily  as needed for pain for the next 10 days.  Always take it with food because may cause gastritis and ulcers.  If you notice nausea, stomach pain, change in the color of stools --->  Stop the medicine and let us know   ICE twice a day  Check the  blood pressure 2 or 3 times a week  Be sure your blood pressure is between 110/65 and  145/85.  if it is consistently higher or lower, let me know

## 2017-08-03 NOTE — Progress Notes (Signed)
Subjective:    Patient ID: Felicia Monroe, female    DOB: [birth year], 47 y.o.   MRN: [id number]  DOS:  08/03/2017 Type of visit - description : acute Interval history: 2 weeks ago developed left shoulder pain, located anteriorly. She has a constant soreness, sometimes at night.  Certain movements increase the pain. Denies injury or overuse. Has taken Aleve without much improvement.   Review of Systems Denies  fever, chills.  No cough No neck pain, paresthesias or motor deficits  Past Medical History:  Diagnosis Date  . Abnormal Pap smear 1995  . Allergy    seasonal  . AMA (advanced maternal age) multigravida 22+   . Anemia   . Blood transfusion 2006   [place]  . 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 H/O chlamydia infection 04/07/93  . H/O infertility 2004  . H/O polydactyly   . Heartburn in pregnancy   . Hx of hyperprolactinemia 08/2002  . Hypertension   . Irregular periods/menstrual cycles   . Microadenoma ([place]) 08/2010  . Obesity   . Personal history of colonic adenomas 04/21/2010  . Uterine fibroid 10/1999    Past Surgical History:  Procedure Laterality Date  . CESAREAN SECTION     x2  . COLONOSCOPY    . MYOMECTOMY  07/2003  . TUBAL LIGATION    . UTERINE FIBROID SURGERY  2005   myomectomy x 1    Social History   Socioeconomic History  . Marital status: Married    Spouse name: Not on file  . Number of children: 2  . Years of education: Not on file  . Highest education level: Not on file  Occupational History  . 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 Financial resource strain: Not on file  . Food insecurity:    Worry: Not on file    Inability: Not on file  . Transportation needs:    Medical: Not on file    Non-medical: Not on file  Tobacco Use  . Smoking status: Never Smoker  . Smokeless tobacco: Never Used  Substance and Sexual Activity  . Alcohol use: Yes    Comment: socially  . Drug use: No  . Sexual activity: Not Currently   Birth control/protection: None    Comment: BTL  Lifestyle  . Physical activity:    Days per week: Not on file    Minutes per session: Not on file  . Stress: Not on file  Relationships  . Social connections:    Talks on phone: Not on file    Gets together: Not on file    Attends religious service: Not on file    Active member of club or organization: Not on file    Attends meetings of clubs or organizations: Not on file    Relationship status: Not on file  . Intimate partner violence:    Fear of current or ex partner: Not on file    Emotionally abused: Not on file    Physically abused: Not on file    Forced sexual activity: Not on file  Other Topics Concern  . Not on file  Social History Narrative   household- pt, husband, children   Two children  2006, 2013      Allergies as of 08/03/2017   No Known Allergies     Medication List        Accurate as of 08/03/17 11:59 PM. Always use your most recent med list.  meloxicam 15 MG tablet Commonly known as:  MOBIC Take 1 tablet (15 mg total) by mouth daily.   triamterene-hydrochlorothiazide 37.5-25 MG tablet Commonly known as:  MAXZIDE-25 Take 1 tablet by mouth daily.          Objective:   Physical Exam BP 126/78 (BP Location: Right Arm, Patient Position: Sitting, Cuff Size: Normal)   Pulse 78   Temp 98.2 F (36.8 C) (Oral)   Resp 16   Ht 5\' 5"  (1.651 m)   Wt 288 lb 8 oz (130.9 kg)   LMP 07/11/2017 (Exact Date)   SpO2 97%   BMI 48.01 kg/m   General:   Well developed, well nourished . NAD.  HEENT:  Normocephalic . Face symmetric, atraumatic Neck: No TTP MSK: Shoulders symmetric. Right shoulder normal Left shoulder: Mild anterior discomfort noted with passive motion slightly TTP at the [facility] joint Skin: Not pale. Not jaundice Neurologic:  alert & oriented X3.  Speech normal, gait appropriate for age and unassisted. Motor symmetric Psych--  Cognition and judgment appear intact.  Cooperative with  normal attention span and concentration.  Behavior appropriate. No anxious or depressed appearing.      Assessment & Plan:   Assessment  HTN GYN: Dr Raphael Gibney  Infertility, irregular periods, uterine fibroids, abnormal Pap smear, history of chlamydia and condyloma, H/o hyperprolactinemia 2004 and microadenomas 2012 (dx per gyn?) BTL  Seasonal allergies +FH colon ca father age 60, + colon polyps  Plan: Left shoulder pain: Acute, for 2 weeks, pain located over the [facility] joint. Bursitis vs AC joint pain. Plan: Ice, meloxicam, call if not better in the next few days for a sports medicine referral. GI precautions extensively discussed, monitor BPs while on NSAIDs.

## 2017-08-04 NOTE — Assessment & Plan Note (Signed)
Plan: Left shoulder pain: Acute, for 2 weeks, pain located over the [facility] joint. Bursitis vs AC joint pain. Plan: Ice, meloxicam, call if not better in the next few days for a sports medicine referral. GI precautions extensively discussed, monitor BPs while on NSAIDs.

## 2017-09-27 ENCOUNTER — Ambulatory Visit (HOSPITAL_BASED_OUTPATIENT_CLINIC_OR_DEPARTMENT_OTHER)
Admission: RE | Admit: 2017-09-27 | Discharge: 2017-09-27 | Disposition: A | Discharge status: Home / Self Care | Payer: Managed Care, Other (non HMO) | Source: Ambulatory Visit | Attending: Internal Medicine | Admitting: Internal Medicine

## 2017-09-27 ENCOUNTER — Ambulatory Visit (INDEPENDENT_AMBULATORY_CARE_PROVIDER_SITE_OTHER): Payer: Managed Care, Other (non HMO) | Admitting: Internal Medicine

## 2017-09-27 ENCOUNTER — Encounter: Payer: Self-pay | Admitting: Internal Medicine

## 2017-09-27 VITALS — BP 126/84 | HR 87 | Temp 97.7°F | Resp 16 | Ht 65.0 in | Wt 282.2 lb

## 2017-09-27 DIAGNOSIS — M25512 Pain in left shoulder: Secondary | ICD-10-CM | POA: Diagnosis not present

## 2017-09-27 DIAGNOSIS — M19012 Primary osteoarthritis, left shoulder: Secondary | ICD-10-CM | POA: Insufficient documentation

## 2017-09-27 NOTE — Progress Notes (Signed)
Subjective:    Patient ID: Felicia Monroe, female    DOB: [birth year], 47 y.o.   MRN: [id number]  DOS:  09/27/2017 Type of visit - description : Acute visit Interval history:  Was seen 08/03/2017 with 2-week history of left shoulder pain without injury. Taking meloxicam, not getting much better. She also initially use ice, did notice a  bruise anteriorly shortly after, that is largely resolved.  Review of Systems Denies neck pain per se. Denies abdominal pain, nausea or diarrhea with taking NSAIDs.  Past Medical History:  Diagnosis Date  . Abnormal Pap smear 1995  . Allergy    seasonal  . AMA (advanced maternal age) multigravida 36+   . Anemia   . Blood transfusion 2006   [place]  . 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 H/O chlamydia infection 04/07/93  . H/O infertility 2004  . H/O polydactyly   . Heartburn in pregnancy   . Hx of hyperprolactinemia 08/2002  . Hypertension   . Irregular periods/menstrual cycles   . Microadenoma ([place]) 08/2010  . Obesity   . Personal history of colonic adenomas 04/21/2010  . Uterine fibroid 10/1999    Past Surgical History:  Procedure Laterality Date  . CESAREAN SECTION     x2  . COLONOSCOPY    . MYOMECTOMY  07/2003  . TUBAL LIGATION    . UTERINE FIBROID SURGERY  2005   myomectomy x 1    Social History   Socioeconomic History  . Marital status: Married    Spouse name: Not on file  . Number of children: 2  . Years of education: Not on file  . Highest education level: Not on file  Occupational History  . 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 Financial resource strain: Not on file  . Food insecurity:    Worry: Not on file    Inability: Not on file  . Transportation needs:    Medical: Not on file    Non-medical: Not on file  Tobacco Use  . Smoking status: Never Smoker  . Smokeless tobacco: Never Used  Substance and Sexual Activity  . Alcohol use: Yes    Comment: socially  . Drug use: No  . Sexual activity:  Not Currently    Birth control/protection: None    Comment: BTL  Lifestyle  . Physical activity:    Days per week: Not on file    Minutes per session: Not on file  . Stress: Not on file  Relationships  . Social connections:    Talks on phone: Not on file    Gets together: Not on file    Attends religious service: Not on file    Active member of club or organization: Not on file    Attends meetings of clubs or organizations: Not on file    Relationship status: Not on file  . Intimate partner violence:    Fear of current or ex partner: Not on file    Emotionally abused: Not on file    Physically abused: Not on file    Forced sexual activity: Not on file  Other Topics Concern  . Not on file  Social History Narrative   household- pt, husband, children   Two children  2006, 2013      Allergies as of 09/27/2017   No Known Allergies     Medication List        Accurate as of 09/27/17 11:59 PM. Always use your most recent  med list.          meloxicam 15 MG tablet Commonly known as:  MOBIC Take 1 tablet (15 mg total) by mouth daily.   triamterene-hydrochlorothiazide 37.5-25 MG tablet Commonly known as:  MAXZIDE-25 Take 1 tablet by mouth daily.          Objective:   Physical Exam BP 126/84 (BP Location: Right Arm, Patient Position: Sitting, Cuff Size: Normal)   Pulse 87   Temp 97.7 F (36.5 C) (Oral)   Resp 16   Ht 5\' 5"  (1.651 m)   Wt 282 lb 4 oz (128 kg)   SpO2 94%   BMI 46.97 kg/m  General:   Well developed, well nourished . NAD.  HEENT:  Normocephalic . Face symmetric, atraumatic Neck: No TTP of the cervical spine. MSK: Right shoulder normal Left shoulder: Slightly TTP.  Anteriorly range of motion minimally decreased due to pain.  No deformities. Skin: Not pale. Not jaundice Neurologic:  alert & oriented X3.  Speech normal, gait appropriate for age and unassisted Psych--  Cognition and judgment appear intact.  Cooperative with normal attention span  and concentration.  Behavior appropriate. No anxious or depressed appearing.      Assessment & Plan:   Assessment  HTN GYN: Dr Raphael Gibney  Infertility, irregular periods, uterine fibroids, abnormal Pap smear, history of chlamydia and condyloma, H/o hyperprolactinemia 2004 and microadenomas 2012 (dx per gyn?) BTL  Seasonal allergies +FH colon ca father age 63, + colon polyps  Plan: Left shoulder pain: Ongoing for approximately 2 months, mild response to meloxicam, she continued to be tender anteriorly at the [facility] joint.  Did have a bruise shortly after the last visit, unclear etiology.  That is resolved. Plan: X-ray, continue meloxicam, refer to sports medicine.

## 2017-09-27 NOTE — Patient Instructions (Addendum)
  STOP BY THE FIRST FLOOR:  get the XR   Refer you to the sports medicine doctor about your left shoulder  Continue taking meloxicam as before

## 2017-09-27 NOTE — Progress Notes (Signed)
Pre visit review using our clinic review tool, if applicable. No additional management support is needed unless otherwise documented below in the visit note. 

## 2017-09-28 NOTE — Assessment & Plan Note (Signed)
Left shoulder pain: Ongoing for approximately 2 months, mild response to meloxicam, she continued to be tender anteriorly at the [facility] joint.  Did have a bruise shortly after the last visit, unclear etiology.  That is resolved. Plan: X-ray, continue meloxicam, refer to sports medicine.

## 2017-09-29 ENCOUNTER — Encounter: Payer: Self-pay | Admitting: Internal Medicine

## 2017-10-03 ENCOUNTER — Encounter: Payer: Self-pay | Admitting: Family Medicine

## 2017-10-03 ENCOUNTER — Ambulatory Visit (INDEPENDENT_AMBULATORY_CARE_PROVIDER_SITE_OTHER): Payer: Managed Care, Other (non HMO) | Admitting: Family Medicine

## 2017-10-03 DIAGNOSIS — M25512 Pain in left shoulder: Secondary | ICD-10-CM

## 2017-10-03 HISTORY — DX: Pain in left shoulder: M25.512

## 2017-10-03 NOTE — Patient Instructions (Signed)
You have rotator cuff impingement (term that includes impingement, bursitis, tendinitis of the shoulder) Try to avoid painful activities (overhead activities, lifting with extended arm) as much as possible. Aleve 2 tabs twice a day with food OR ibuprofen 3 tabs three times a day with food for pain and inflammation. Can take tylenol in addition to this. Subacromial injection may be beneficial to help with pain and to decrease inflammation. Consider physical therapy with transition to home exercise program. Do home exercise program with theraband and scapular stabilization exercises daily 3 sets of 10 once a day. If not improving at follow-up we will consider further imaging, injection, physical therapy, and/or nitro patches. Follow up with me in 6 weeks.

## 2017-10-03 NOTE — Assessment & Plan Note (Signed)
2/2 rotator cuff impingement.  Discussed options - she will start home exercise program.  Aleve or ibuprofen if needed.  Declined nitro patches, physical therapy, injection for now.  F/u in 6 weeks.

## 2017-10-03 NOTE — Progress Notes (Signed)
PCP and consultation requested by: Colon Branch, MD  Subjective:   HPI: Patient is a 47 y.o. female here for left shoulder pain.  Patient denies known injury or trauma. She states about 2 months ago she developed lateral/anterior left shoulder pain. Pain is worse at night, with overhead motions. Pain level 2-3/10, can be sharp. meloxicam did not help. No prior issues. She is right handed. No skin changes, numbness.  Past Medical History:  Diagnosis Date  . Abnormal Pap smear 1995  . Allergy    seasonal  . AMA (advanced maternal age) multigravida 55+   . Anemia   . Blood transfusion 2006   [place]  . 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 H/O chlamydia infection 04/07/93  . H/O infertility 2004  . H/O polydactyly   . Heartburn in pregnancy   . Hx of hyperprolactinemia 08/2002  . Hypertension   . Irregular periods/menstrual cycles   . Microadenoma ([place]) 08/2010  . Obesity   . Personal history of colonic adenomas 04/21/2010  . Uterine fibroid 10/1999    Current Outpatient Medications on File Prior to Visit  Medication Sig Dispense Refill  . meloxicam (MOBIC) 15 MG tablet Take 1 tablet (15 mg total) by mouth daily. (Patient not taking: Reported on 09/27/2017) 20 tablet 0  . triamterene-hydrochlorothiazide (MAXZIDE-25) 37.5-25 MG tablet Take 1 tablet by mouth daily. 90 tablet 3   Current Facility-Administered Medications on File Prior to Visit  Medication Dose Route Frequency Provider Last Rate Last Dose  . 0.9 %  sodium chloride infusion  500 mL Intravenous Once Gatha Mayer, MD        Past Surgical History:  Procedure Laterality Date  . CESAREAN SECTION     x2  . COLONOSCOPY    . MYOMECTOMY  07/2003  . TUBAL LIGATION    . UTERINE FIBROID SURGERY  2005   myomectomy x 1    No Known Allergies  Social History   Socioeconomic History  . Marital status: Married    Spouse name: Not on file  . Number of children: 2  . Years of education: Not on file  . Highest education  level: Not on file  Occupational History  . 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 Financial resource strain: Not on file  . Food insecurity:    Worry: Not on file    Inability: Not on file  . Transportation needs:    Medical: Not on file    Non-medical: Not on file  Tobacco Use  . Smoking status: Never Smoker  . Smokeless tobacco: Never Used  Substance and Sexual Activity  . Alcohol use: Yes    Comment: socially  . Drug use: No  . Sexual activity: Not Currently    Birth control/protection: None    Comment: BTL  Lifestyle  . Physical activity:    Days per week: Not on file    Minutes per session: Not on file  . Stress: Not on file  Relationships  . Social connections:    Talks on phone: Not on file    Gets together: Not on file    Attends religious service: Not on file    Active member of club or organization: Not on file    Attends meetings of clubs or organizations: Not on file    Relationship status: Not on file  . Intimate partner violence:    Fear of current or ex partner: Not on file    Emotionally  abused: Not on file    Physically abused: Not on file    Forced sexual activity: Not on file  Other Topics Concern  . Not on file  Social History Narrative   household- pt, husband, children   Two children  2006, 2013    Family History  Problem Relation Age of Onset  . Colon cancer Father 97          . Hypertension Father   . Colon polyps Brother        x 2  . Hypertension Mother   . Diabetes Mother   . Colon polyps Sister   . Coronary artery disease Other        GM, aunt  . Diabetes Sister   . Heart disease Maternal Grandmother   . Diabetes Maternal Grandmother   . Breast cancer Neg Hx   . Esophageal cancer Neg Hx   . Rectal cancer Neg Hx   . Stomach cancer Neg Hx     BP (!) 154/81   Pulse 83   Ht 5\' 5"  (1.651 m)   Wt 280 lb (127 kg)   BMI 46.59 kg/m   Review of Systems: See HPI above.     Objective:  Physical  Exam:  Gen: NAD, comfortable in exam room  Left shoulder: No swelling, ecchymoses.  No gross deformity. Mild TTP over proximal biceps tendon, lateral to acromion.  No other tenderness. FROM with mild painful arc. Mildly positive Hawkins, Neers. Negative Yergasons. Strength 5/5 with empty can and resisted internal/external rotation.  Mild pain empty can. Negative apprehension. NV intact distally.  Right shoulder: No swelling, ecchymoses.  No gross deformity. No TTP. FROM. Strength 5/5 with empty can and resisted internal/external rotation. NV intact distally.    Assessment & Plan:  1. Left shoulder pain - 2/2 rotator cuff impingement.  Discussed options - she will start home exercise program.  Aleve or ibuprofen if needed.  Declined nitro patches, physical therapy, injection for now.  F/u in 6 weeks.

## 2017-11-14 ENCOUNTER — Ambulatory Visit: Payer: Managed Care, Other (non HMO) | Admitting: Family Medicine

## 2018-01-23 ENCOUNTER — Encounter: Payer: Self-pay | Admitting: Internal Medicine

## 2018-01-23 ENCOUNTER — Ambulatory Visit (INDEPENDENT_AMBULATORY_CARE_PROVIDER_SITE_OTHER): Payer: Managed Care, Other (non HMO) | Admitting: Internal Medicine

## 2018-01-23 VITALS — BP 132/70 | HR 84 | Temp 98.1°F | Resp 16 | Ht 65.0 in | Wt 282.5 lb

## 2018-01-23 DIAGNOSIS — J40 Bronchitis, not specified as acute or chronic: Secondary | ICD-10-CM

## 2018-01-23 MED ORDER — HYDROCODONE-HOMATROPINE 5-1.5 MG/5ML PO SYRP: 5.0000 mL | ORAL_SOLUTION | Freq: Every evening | ORAL | 0 refills | Status: DC | PRN

## 2018-01-23 MED ORDER — AZITHROMYCIN 250 MG PO TABS: ORAL_TABLET | ORAL | 0 refills | Status: DC

## 2018-01-23 NOTE — Progress Notes (Signed)
Subjective:    Patient ID: Felicia Monroe, female    DOB: 08-04-1970, 47 y.o.   MRN: [id number]  DOS:  01/23/2018 Type of visit - description : acute   Respiratory symptoms of started 3 weeks ago, then  she felt better but symptoms resurface a week ago: Cough, chest congestion, fever blisters, eyes irritated.  Actually the sinus congestion is not as severe at this time but the cough is worse, making sleep difficult. She is taking OTCs with only marginal help.  Review of Systems No fever chills No nausea or vomiting Mild aches No headache or sore throat Cough is mostly dry.  Past Medical History:  Diagnosis Date  . Abnormal Pap smear 1995  . Allergy    seasonal  . AMA (advanced maternal age) multigravida 52+   . Anemia   . Blood transfusion 2006   [place]  . 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 H/O chlamydia infection 04/07/93  . H/O infertility 2004  . H/O polydactyly   . Heartburn in pregnancy   . Hx of hyperprolactinemia 08/2002  . Hypertension   . Irregular periods/menstrual cycles   . Microadenoma ([place]) 08/2010  . Obesity   . Personal history of colonic adenomas 04/21/2010  . Uterine fibroid 10/1999    Past Surgical History:  Procedure Laterality Date  . CESAREAN SECTION     x2  . COLONOSCOPY    . MYOMECTOMY  07/2003  . TUBAL LIGATION    . UTERINE FIBROID SURGERY  2005   myomectomy x 1    Social History   Socioeconomic History  . Marital status: Married    Spouse name: Not on file  . Number of children: 2  . Years of education: Not on file  . Highest education level: Not on file  Occupational History  . 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 Financial resource strain: Not on file  . Food insecurity:    Worry: Not on file    Inability: Not on file  . Transportation needs:    Medical: Not on file    Non-medical: Not on file  Tobacco Use  . Smoking status: Never Smoker  . Smokeless tobacco: Never Used  Substance and Sexual Activity    . Alcohol use: Yes    Comment: socially  . Drug use: No  . Sexual activity: Not Currently    Birth control/protection: None    Comment: BTL  Lifestyle  . Physical activity:    Days per week: Not on file    Minutes per session: Not on file  . Stress: Not on file  Relationships  . Social connections:    Talks on phone: Not on file    Gets together: Not on file    Attends religious service: Not on file    Active member of club or organization: Not on file    Attends meetings of clubs or organizations: Not on file    Relationship status: Not on file  . Intimate partner violence:    Fear of current or ex partner: Not on file    Emotionally abused: Not on file    Physically abused: Not on file    Forced sexual activity: Not on file  Other Topics Concern  . Not on file  Social History Narrative   household- pt, husband, children   Two children  2006, 2013      Allergies as of 01/23/2018   No Known Allergies  Medication List        Accurate as of 01/23/18 11:59 PM. Always use your most recent med list.          azithromycin 250 MG tablet Commonly known as:  ZITHROMAX 2 tabs a day the first day, then 1 tab a day x 4 days   HYDROcodone-homatropine 5-1.5 MG/5ML syrup Commonly known as:  HYCODAN Take 5 mLs by mouth at bedtime as needed for cough.   triamterene-hydrochlorothiazide 37.5-25 MG tablet Commonly known as:  MAXZIDE-25 Take 1 tablet by mouth daily.          Objective:   Physical Exam BP 132/70 (BP Location: Left Wrist, Patient Position: Sitting, Cuff Size: Small)   Pulse 84   Temp 98.1 F (36.7 C) (Oral)   Resp 16   Ht 5\' 5"  (1.651 m)   Wt 282 lb 8 oz (128.1 kg)   LMP 12/29/2017 (Exact Date)   SpO2 96%   BMI 47.01 kg/m  General:   Well developed, NAD, see BMI.  Cough noted during the exam. HEENT:  Normocephalic . Face symmetric, atraumatic.  TMs normal, throat symmetric no red.  Sinuses no TTP, nose is slightly congested Lungs:  Few  rhonchi with cough Normal respiratory effort, no intercostal retractions, no accessory muscle use. Heart: RRR,  no murmur.  No pretibial edema bilaterally  Skin: Not pale. Not jaundice Neurologic:  alert & oriented X3.  Speech normal, gait appropriate for age and unassisted Psych--  Cognition and judgment appear intact.  Cooperative with normal attention span and concentration.  Behavior appropriate. No anxious or depressed appearing.      Assessment & Plan:    Assessment  HTN GYN: Dr Raphael Gibney  Infertility, irregular periods, uterine fibroids, abnormal Pap smear, history of chlamydia and condyloma, H/o hyperprolactinemia 2004 and microadenomas 2012 (dx per gyn?) BTL  Seasonal allergies +FH colon ca father age 46, + colon polyps  Plan: Bronchitis: Symptoms as described above, suspect bronchitis, recommend Zithromax, supportive care, hydrocodone at bedtime (she is coughing frequently at night and has been unable to sleep well).  Call if no better.  See instructions

## 2018-01-23 NOTE — Patient Instructions (Signed)
Rest, fluids , tylenol  For cough:  Take Mucinex DM twice a day as needed until better Hydrocodone at night for severe cough, will cause drowsiness   For nasal congestion: Use OTC Nasocort or Flonase : 2 nasal sprays on each side of the nose in the morning until you feel better    Take the antibiotic as prescribed  (Zithromax)  Call if not gradually better over the next  10 days  Call anytime if the symptoms are severe

## 2018-01-23 NOTE — Progress Notes (Signed)
Pre visit review using our clinic review tool, if applicable. No additional management support is needed unless otherwise documented below in the visit note. 

## 2018-01-24 NOTE — Assessment & Plan Note (Signed)
Bronchitis: Symptoms as described above, suspect bronchitis, recommend Zithromax, supportive care, hydrocodone at bedtime (she is coughing frequently at night and has been unable to sleep well).  Call if no better.  See instructions

## 2018-05-16 ENCOUNTER — Encounter: Payer: Self-pay | Admitting: Internal Medicine

## 2018-05-16 ENCOUNTER — Telehealth: Payer: Self-pay

## 2018-05-16 ENCOUNTER — Ambulatory Visit (INDEPENDENT_AMBULATORY_CARE_PROVIDER_SITE_OTHER): Payer: Managed Care, Other (non HMO) | Admitting: Internal Medicine

## 2018-05-16 VITALS — BP 128/80 | HR 71 | Temp 98.1°F | Resp 16 | Ht 65.0 in | Wt 279.5 lb

## 2018-05-16 DIAGNOSIS — Z0279 Encounter for issue of other medical certificate: Secondary | ICD-10-CM

## 2018-05-16 DIAGNOSIS — Z0000 Encounter for general adult medical examination without abnormal findings: Secondary | ICD-10-CM | POA: Diagnosis not present

## 2018-05-16 LAB — LIPID PANEL
Cholesterol: 117 mg/dL (ref 0–200)
HDL: 53.1 mg/dL (ref >39.00)
LDL Cholesterol: 51 mg/dL (ref 0–99)
NONHDL: 64.06
TRIGLYCERIDES: 65 mg/dL (ref 0.0–149.0)
Total CHOL/HDL Ratio: 2
VLDL: 13 mg/dL (ref 0.0–40.0)

## 2018-05-16 LAB — CBC WITH DIFFERENTIAL/PLATELET
BASOS PCT: 0.5 % (ref 0.0–3.0)
Basophils Absolute: 0 10*3/uL (ref 0.0–0.1)
EOS PCT: 1.5 % (ref 0.0–5.0)
Eosinophils Absolute: 0.1 10*3/uL (ref 0.0–0.7)
HCT: 33.8 % — ABNORMAL LOW (ref 36.0–46.0)
Hemoglobin: 11.3 g/dL — ABNORMAL LOW (ref 12.0–15.0)
LYMPHS ABS: 2.8 10*3/uL (ref 0.7–4.0)
Lymphocytes Relative: 30.5 % (ref 12.0–46.0)
MCHC: 33.3 g/dL (ref 30.0–36.0)
MCV: 80.8 fl (ref 78.0–100.0)
MONO ABS: 0.6 10*3/uL (ref 0.1–1.0)
MONOS PCT: 6.3 % (ref 3.0–12.0)
NEUTROS ABS: 5.6 10*3/uL (ref 1.4–7.7)
NEUTROS PCT: 61.2 % (ref 43.0–77.0)
PLATELETS: 305 10*3/uL (ref 150.0–400.0)
RBC: 4.19 Mil/uL (ref 3.87–5.11)
RDW: 14.4 % (ref 11.5–15.5)
WBC: 9.1 10*3/uL (ref 4.0–10.5)

## 2018-05-16 LAB — COMPREHENSIVE METABOLIC PANEL
ALK PHOS: 70 U/L (ref 39–117)
ALT: 8 U/L (ref 0–35)
AST: 9 U/L (ref 0–37)
Albumin: 3.6 g/dL (ref 3.5–5.2)
BUN: 14 mg/dL (ref 6–23)
CALCIUM: 9.4 mg/dL (ref 8.4–10.5)
CO2: 32 mEq/L (ref 19–32)
Chloride: 101 mEq/L (ref 96–112)
Creatinine, Ser: 0.74 mg/dL (ref 0.40–1.20)
GFR: 100.72 mL/min (ref >60.00)
GLUCOSE: 114 mg/dL — AB (ref 70–99)
POTASSIUM: 4.5 meq/L (ref 3.5–5.1)
Sodium: 139 mEq/L (ref 135–145)
TOTAL PROTEIN: 6.9 g/dL (ref 6.0–8.3)
Total Bilirubin: 0.6 mg/dL (ref 0.2–1.2)

## 2018-05-16 LAB — HEMOGLOBIN A1C: Hgb A1c MFr Bld: 5.7 % (ref 4.6–6.5)

## 2018-05-16 NOTE — Patient Instructions (Signed)
GO TO THE LAB : Get the blood work     GO TO THE FRONT DESK Schedule your next appointment         Check the  blood pressure  monthly   Be sure your blood pressure is between 110/65 and  135/85. If it is consistently higher or lower, let me know   HOW TO TAKE YOUR BLOOD PRESSURE:   Rest 5 minutes before taking your blood pressure.   Don't smoke or drink caffeinated beverages for at least 30 minutes before.   Take your blood pressure before (not after) you eat.   Sit comfortably with your back supported and both feet on the floor (don't cross your legs).   Elevate your arm to heart level on a table or a desk.   Use the proper sized cuff. It should fit smoothly and snugly around your bare upper arm. There should be enough room to slip a fingertip under the cuff. The bottom edge of the cuff should be 1 inch above the crease of the elbow.   Ideally, take 3 measurements at one sitting and record the average.

## 2018-05-16 NOTE — Assessment & Plan Note (Signed)
HTN: Refill Maxide, checking labs, recommend ambulatory BPs Morbid obesity: life style changes discussed  Stress: Counseled RTC 1 year

## 2018-05-16 NOTE — Assessment & Plan Note (Addendum)
Tdap 2013; Flu shot- declined today again - CCS (+)  FH colon ca and polyps Cscope 03-2010, 2 adenomatous polyps Cscope 12-2013 Cscope 3/ 2019, next per GI -Female care: Sees gyn , MMG overdue, plans to call; PAP 03/2018 -diet extensively discussed, main barrier to healthy lifestyle is lack of time.  Recommend to visit the wellness center. -Labs: CMP, FLP, CBC, A1c

## 2018-05-16 NOTE — Telephone Encounter (Signed)
Forms signed and faxed to [place] [facility] at [phone]- copy of form mailed to Pt and sent for scanning.

## 2018-05-16 NOTE — Progress Notes (Signed)
Subjective:    Patient ID: Felicia Monroe, female    DOB: 1970-07-23, 48 y.o.   MRN: [id number]  DOS:  05/16/2018 Type of visit - description: CPX No major concerns   Review of Systems Pt is very busy, has 2 children, no time for exercise.  Some stress but handling it well.  Other than above, a 14 point review of systems is negative    Past Medical History:  Diagnosis Date  . Abnormal Pap smear 1995  . Allergy    seasonal  . AMA (advanced maternal age) multigravida 20+   . Anemia   . Blood transfusion 2006   [place]  . 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 H/O chlamydia infection 04/07/93  . H/O infertility 2004  . H/O polydactyly   . Heartburn in pregnancy   . Hx of hyperprolactinemia 08/2002  . Hypertension   . Irregular periods/menstrual cycles   . Microadenoma ([place]) 08/2010  . Obesity   . Personal history of colonic adenomas 04/21/2010  . Uterine fibroid 10/1999    Past Surgical History:  Procedure Laterality Date  . CESAREAN SECTION     x2  . COLONOSCOPY    . MYOMECTOMY  07/2003  . TUBAL LIGATION    . UTERINE FIBROID SURGERY  2005   myomectomy x 1    Social History   Socioeconomic History  . Marital status: Married    Spouse name: Not on file  . Number of children: 2  . Years of education: Not on file  . Highest education level: Not on file  Occupational History  . 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 Financial resource strain: Not on file  . Food insecurity:    Worry: Not on file    Inability: Not on file  . Transportation needs:    Medical: Not on file    Non-medical: Not on file  Tobacco Use  . Smoking status: Never Smoker  . Smokeless tobacco: Never Used  Substance and Sexual Activity  . Alcohol use: Yes    Comment: socially  . Drug use: No  . Sexual activity: Not Currently    Birth control/protection: None    Comment: BTL  Lifestyle  . Physical activity:    Days per week: Not on file    Minutes per session: Not on  file  . Stress: Not on file  Relationships  . Social connections:    Talks on phone: Not on file    Gets together: Not on file    Attends religious service: Not on file    Active member of club or organization: Not on file    Attends meetings of clubs or organizations: Not on file    Relationship status: Not on file  . Intimate partner violence:    Fear of current or ex partner: Not on file    Emotionally abused: Not on file    Physically abused: Not on file    Forced sexual activity: Not on file  Other Topics Concern  . Not on file  Social History Narrative   household- pt, husband, children   Two children  2006, 2013     Family History  Problem Relation Age of Onset  . Colon cancer Father 35          . Hypertension Father   . Colon polyps Brother        x 2  . Hypertension Mother   . Diabetes Mother   .  Colon polyps Sister   . Coronary artery disease Other        GM, aunt  . Diabetes Sister   . Heart disease Maternal Grandmother   . Diabetes Maternal Grandmother   . Breast cancer Neg Hx   . Esophageal cancer Neg Hx   . Rectal cancer Neg Hx   . Stomach cancer Neg Hx      Allergies as of 05/16/2018   No Known Allergies     Medication List       Accurate as of May 16, 2018  8:29 PM. Always use your most recent med list.        triamterene-hydrochlorothiazide 37.5-25 MG tablet Commonly known as:  MAXZIDE-25 Take 1 tablet by mouth daily.           Objective:   Physical Exam BP 128/80 (BP Location: Left Arm, Patient Position: Sitting, Cuff Size: Normal)   Pulse 71   Temp 98.1 F (36.7 C) (Oral)   Resp 16   Ht 5\' 5"  (1.651 m)   Wt 279 lb 8 oz (126.8 kg)   LMP 04/20/2018 (Exact Date)   SpO2 91%   BMI 46.51 kg/m  General: Well developed, NAD, BMI noted Neck: No  thyromegaly  HEENT:  Normocephalic . Face symmetric, atraumatic Lungs:  CTA B Normal respiratory effort, no intercostal retractions, no accessory muscle use. Heart: RRR,  no  murmur.  No pretibial edema bilaterally  Abdomen:  Not distended, soft, non-tender. No rebound or rigidity.   Skin: Exposed areas without rash. Not pale. Not jaundice Neurologic:  alert & oriented X3.  Speech normal, gait appropriate for age and unassisted Strength symmetric and appropriate for age.  Psych: Cognition and judgment appear intact.  Cooperative with normal attention span and concentration.  Behavior appropriate. No anxious or depressed appearing.     Assessment      Assessment  HTN Morbid Obesity  GYN: Dr Raphael Gibney  Infertility, irregular periods, uterine fibroids, abnormal Pap smear, history of chlamydia and condyloma, H/o hyperprolactinemia 2004 and microadenomas 2012 (dx per gyn?) BTL  Seasonal allergies +FH colon ca father age 61, + colon polyps  PLAN HTN: Refill Maxide, checking labs, recommend ambulatory BPs Morbid obesity: life style changes discussed  Stress: Counseled RTC 1 year

## 2018-05-16 NOTE — Progress Notes (Signed)
Pre visit review using our clinic review tool, if applicable. No additional management support is needed unless otherwise documented below in the visit note. 

## 2018-05-18 ENCOUNTER — Ambulatory Visit (INDEPENDENT_AMBULATORY_CARE_PROVIDER_SITE_OTHER): Payer: Managed Care, Other (non HMO) | Admitting: Medical

## 2018-05-18 ENCOUNTER — Emergency Department (HOSPITAL_BASED_OUTPATIENT_CLINIC_OR_DEPARTMENT_OTHER)
Admission: EM | Admit: 2018-05-18 | Discharge: 2018-05-18 | Disposition: A | Discharge status: Home / Self Care | Payer: Managed Care, Other (non HMO) | Attending: Emergency Medicine | Admitting: Emergency Medicine

## 2018-05-18 ENCOUNTER — Encounter (HOSPITAL_BASED_OUTPATIENT_CLINIC_OR_DEPARTMENT_OTHER): Payer: Self-pay | Admitting: Emergency Medicine

## 2018-05-18 ENCOUNTER — Other Ambulatory Visit: Payer: Self-pay

## 2018-05-18 ENCOUNTER — Emergency Department (HOSPITAL_BASED_OUTPATIENT_CLINIC_OR_DEPARTMENT_OTHER): Payer: Managed Care, Other (non HMO)

## 2018-05-18 ENCOUNTER — Encounter: Payer: Self-pay | Admitting: Medical

## 2018-05-18 VITALS — BP 155/90 | HR 75 | Temp 98.1°F | Resp 16 | Ht 65.0 in | Wt 284.6 lb

## 2018-05-18 DIAGNOSIS — Z79899 Other long term (current) drug therapy: Secondary | ICD-10-CM | POA: Diagnosis not present

## 2018-05-18 DIAGNOSIS — Z7982 Long term (current) use of aspirin: Secondary | ICD-10-CM | POA: Insufficient documentation

## 2018-05-18 DIAGNOSIS — R0789 Other chest pain: Secondary | ICD-10-CM

## 2018-05-18 DIAGNOSIS — M546 Pain in thoracic spine: Secondary | ICD-10-CM | POA: Diagnosis not present

## 2018-05-18 DIAGNOSIS — M25512 Pain in left shoulder: Secondary | ICD-10-CM | POA: Diagnosis not present

## 2018-05-18 DIAGNOSIS — I10 Essential (primary) hypertension: Secondary | ICD-10-CM | POA: Insufficient documentation

## 2018-05-18 DIAGNOSIS — R079 Chest pain, unspecified: Secondary | ICD-10-CM | POA: Insufficient documentation

## 2018-05-18 LAB — CBC WITH DIFFERENTIAL/PLATELET
ABS IMMATURE GRANULOCYTES: 0.04 10*3/uL (ref 0.00–0.07)
Basophils Absolute: 0 10*3/uL (ref 0.0–0.1)
Basophils Relative: 0 %
Eosinophils Absolute: 0.2 10*3/uL (ref 0.0–0.5)
Eosinophils Relative: 2 %
HCT: 33 % — ABNORMAL LOW (ref 36.0–46.0)
Hemoglobin: 10.8 g/dL — ABNORMAL LOW (ref 12.0–15.0)
Immature Granulocytes: 0 %
LYMPHS PCT: 33 %
Lymphs Abs: 3 10*3/uL (ref 0.7–4.0)
MCH: 26.5 pg (ref 26.0–34.0)
MCHC: 32.7 g/dL (ref 30.0–36.0)
MCV: 80.9 fL (ref 80.0–100.0)
Monocytes Absolute: 0.7 10*3/uL (ref 0.1–1.0)
Monocytes Relative: 8 %
Neutro Abs: 5.2 10*3/uL (ref 1.7–7.7)
Neutrophils Relative %: 57 %
Platelets: 323 10*3/uL (ref 150–400)
RBC: 4.08 MIL/uL (ref 3.87–5.11)
RDW: 13.2 % (ref 11.5–15.5)
WBC: 9.1 10*3/uL (ref 4.0–10.5)
nRBC: 0 % (ref 0.0–0.2)

## 2018-05-18 LAB — COMPREHENSIVE METABOLIC PANEL
ALT: 13 U/L (ref 0–44)
AST: 13 U/L — ABNORMAL LOW (ref 15–41)
Albumin: 3.3 g/dL — ABNORMAL LOW (ref 3.5–5.0)
Alkaline Phosphatase: 64 U/L (ref 38–126)
Anion gap: 7 (ref 5–15)
BILIRUBIN TOTAL: 0.5 mg/dL (ref 0.3–1.2)
BUN: 12 mg/dL (ref 6–20)
CO2: 25 mmol/L (ref 22–32)
Calcium: 8.7 mg/dL — ABNORMAL LOW (ref 8.9–10.3)
Chloride: 102 mmol/L (ref 98–111)
Creatinine, Ser: 0.66 mg/dL (ref 0.44–1.00)
Glucose, Bld: 82 mg/dL (ref 70–99)
Potassium: 3.5 mmol/L (ref 3.5–5.1)
Sodium: 134 mmol/L — ABNORMAL LOW (ref 135–145)
TOTAL PROTEIN: 7.1 g/dL (ref 6.5–8.1)

## 2018-05-18 LAB — TROPONIN I: Troponin I: <0.03 ng/mL (ref <0.03)

## 2018-05-18 LAB — LIPASE, BLOOD: LIPASE: 36 U/L (ref 11–51)

## 2018-05-18 MED ORDER — PANTOPRAZOLE SODIUM 20 MG PO TBEC: 20.0000 mg | DELAYED_RELEASE_TABLET | Freq: Every day | ORAL | 0 refills | Status: DC

## 2018-05-18 MED ORDER — ASPIRIN 81 MG PO CHEW: 81.0000 mg | CHEWABLE_TABLET | Freq: Every day | ORAL | 0 refills | Status: DC

## 2018-05-18 NOTE — Patient Instructions (Addendum)
With your intermittent episodes of pain in back, left shoulder and mid chest, I do want you to be evaluated at the emergency department today.  You had significant pain last night in your chest that caused tears to come to your eyes and you have some low level chest pressure sensation since noon today.  What is your main risk factors for cardiovascular problems is your hypertension.  Also mention your mother may have had a cardiac issue but you are unclear what that was.  In light of your recent severe chest pain and current low level pain, I want you to be evaluated in the ED now.  ekg done in our office today. Sinus rhythm. Some artifact but no ischemic changes.  Follow up date to be determined after ED evaluation.

## 2018-05-18 NOTE — Discharge Instructions (Signed)
1.  Start taking Protonix daily.  Take about 30 minutes before your first meal.  After you have eaten you may take your 81 mg aspirin. 2.  Follow-up with your doctor to discuss outpatient stress testing. 3.  Return to the emergency department if you have recurrence, worsening, new or changing symptoms.

## 2018-05-18 NOTE — Progress Notes (Signed)
Subjective:    Patient ID: Felicia Monroe, female    DOB: 1971/01/16, 48 y.o.   MRN: [id number]  HPI  Pt in states last night she got some tight some tightness of chest sensation. She does states that Tuesday night she had some shoulder pain present only at night for 2 ours or more.. No injury or fall.   She also reports left upper back pain yesterday morning up until the afternoon.  Also states last night while taking out dishes from dishwasher had some significant pain taking out dishes out of dishwashers. Even today she has some pressure in chest.(low level pressure since lunch)   Last night pain was worse and 15 minutes.(pain last night brought tears to eyes and she does describe high pain thresh hold).  Pt is aware and make point women present sometimes.  No known fh of any heart attacks or strokes. Then pt states mom may have had issues with her heart.  Pt is not diabetic. She has hypertension. bp is elevated presently.No neurologic signs or symptms No high cholesterol Nonsmoker.  Pt states last week some indigestion. And stated last week felt like something sitting on chest. This is atypical sensation for her.      Review of Systems  Constitutional: Negative for chills, fatigue and fever.  HENT: Negative for congestion, dental problem, drooling, ear pain and facial swelling.   Respiratory: Negative for cough, chest tightness, shortness of breath and wheezing.   Cardiovascular: Positive for chest pain. Negative for palpitations.  Gastrointestinal: Negative for abdominal distention, abdominal pain and blood in stool.  Musculoskeletal: Negative for back pain, joint swelling and neck pain.       See hpi. Lt shoulder and back pain.  Skin: Negative for rash.  Neurological: Negative for dizziness and headaches.  Hematological: Negative for adenopathy. Does not bruise/bleed easily.  Psychiatric/Behavioral: Negative for behavioral problems and confusion.   Past Medical  History:  Diagnosis Date  . Abnormal Pap smear 1995  . Allergy    seasonal  . AMA (advanced maternal age) multigravida 61+   . Anemia   . Blood transfusion 2006   [place]  . 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 H/O chlamydia infection 04/07/93  . H/O infertility 2004  . H/O polydactyly   . Heartburn in pregnancy   . Hx of hyperprolactinemia 08/2002  . Hypertension   . Irregular periods/menstrual cycles   . Microadenoma ([place]) 08/2010  . Obesity   . Personal history of colonic adenomas 04/21/2010  . Uterine fibroid 10/1999     Social History   Socioeconomic History  . Marital status: Married    Spouse name: Not on file  . Number of children: 2  . Years of education: Not on file  . Highest education level: Not on file  Occupational History  . 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 Financial resource strain: Not on file  . Food insecurity:    Worry: Not on file    Inability: Not on file  . Transportation needs:    Medical: Not on file    Non-medical: Not on file  Tobacco Use  . Smoking status: Never Smoker  . Smokeless tobacco: Never Used  Substance and Sexual Activity  . Alcohol use: Yes    Comment: socially  . Drug use: No  . Sexual activity: Not Currently    Birth control/protection: Surgical    Comment: BTL  Lifestyle  . Physical activity:  Days per week: Not on file    Minutes per session: Not on file  . Stress: Not on file  Relationships  . Social connections:    Talks on phone: Not on file    Gets together: Not on file    Attends religious service: Not on file    Active member of club or organization: Not on file    Attends meetings of clubs or organizations: Not on file    Relationship status: Not on file  . Intimate partner violence:    Fear of current or ex partner: Not on file    Emotionally abused: Not on file    Physically abused: Not on file    Forced sexual activity: Not on file  Other Topics Concern  . Not on file  Social  History Narrative   household- pt, husband, children   Two children  2006, 2013    Past Surgical History:  Procedure Laterality Date  . CESAREAN SECTION     x2  . COLONOSCOPY    . MYOMECTOMY  07/2003  . TUBAL LIGATION    . UTERINE FIBROID SURGERY  2005   myomectomy x 1    Family History  Problem Relation Age of Onset  . Colon cancer Father 69          . Hypertension Father   . Colon polyps Brother        x 2  . Hypertension Mother   . Diabetes Mother   . Colon polyps Sister   . Coronary artery disease Other        GM, aunt  . Diabetes Sister   . Heart disease Maternal Grandmother   . Diabetes Maternal Grandmother   . Breast cancer Neg Hx   . Esophageal cancer Neg Hx   . Rectal cancer Neg Hx   . Stomach cancer Neg Hx     No Known Allergies  Current Outpatient Medications on File Prior to Visit  Medication Sig Dispense Refill  . triamterene-hydrochlorothiazide (MAXZIDE-25) 37.5-25 MG tablet Take 1 tablet by mouth daily. 90 tablet 3   No current facility-administered medications on file prior to visit.     BP (!) 155/90   Pulse 75   Temp 98.1 F (36.7 C) (Oral)   Resp 16   Ht 5\' 5"  (1.651 m)   Wt 284 lb 9.6 oz (129.1 kg)   LMP 04/20/2018 (Exact Date)   SpO2 100%   BMI 47.36 kg/m       Objective:   Physical Exam  General Mental Status- Alert. General Appearance- Not in acute distress.   Skin General: Color- Normal Color. Moisture- Normal Moisture.  Neck Carotid Arteries- Normal color. Moisture- Normal Moisture. No carotid bruits. No JVD.  Chest and Lung Exam Auscultation: Breath Sounds:-Normal.  Cardiovascular Auscultation:Rythm- Regular. Murmurs & Other Heart Sounds:Auscultation of the heart reveals- No Murmurs.  Abdomen Inspection:-Inspeection Normal. Palpation/Percussion:Note:No mass. Palpation and Percussion of the abdomen reveal- Non Tender, Non Distended + BS, no rebound or guarding.    Neurologic Cranial Nerve exam:- CN III-XII  intact(No nystagmus), symmetric smile. Drift Test:- No drift. Romberg Exam:- Negative.  Heal to Toe Gait exam:-Normal. Finger to Nose:- Normal/Intact Strength:- 5/5 equal and symmetric strength both upper and lower extremities.  Lt shoulder- no pain on palpation or rom. Back- no pain on palpatin of thoracic spine. Anterior chest- no pain on palpation of chest wall.      Assessment & Plan:  With your intermittent episodes of pain in back,  left shoulder and mid chest, I do want you to be evaluated at the emergency department today.  You had significant pain last night in your chest that caused tears to come to your eyes and you have some low level chest pressure sensation since noon today.  What is your main risk factors for cardiovascular problems is your hypertension.  Also mention your mother may have had a cardiac issue but you are unclear what that was.  In light of your recent severe chest pain and current low level pain, I want you to be evaluated in the ED now.  ekg done in our office today. Sinus rhythm. Some artifact but no ischemic changes.  40 minutes spent with pt. 50% of time spent counseling on reason she needs stat work up through ED. Also coordinated care/discussed case with ED PA prior to sending pt downstairs,   Follow up date to be determined after ED evaluation.

## 2018-05-18 NOTE — ED Triage Notes (Signed)
Chest pain since yesterday.  Sent from pmd upstairs for further eval.  EKG nsr at pmd office.

## 2018-05-18 NOTE — ED Notes (Signed)
ED Provider at bedside. 

## 2018-05-18 NOTE — ED Notes (Signed)
ED Provider at bedside discussing test results and dispo plan of care. 

## 2018-05-18 NOTE — ED Notes (Signed)
Pt on monitor 

## 2018-05-18 NOTE — ED Provider Notes (Signed)
[facility] EMERGENCY DEPARTMENT Provider Note   CSN: [id number] Arrival date & time: 05/18/18  1450     History   Chief Complaint Chief Complaint  Patient presents with  . Chest Pain    HPI Felicia Monroe is a 48 y.o. female.  HPI Patient reports she had an episode of chest pain yesterday.  She was unloading her dishwasher.  She got an onset of tightness and some general burning in her central chest.  That lasted about 15 minutes.  She reports she did not have much associated symptoms at that time.  She had however over the preceding several days noticed some pain in her left shoulder and upper back which she attributed to musculoskeletal pain.  She however was concerned about possible atypical cardiac presentation and saw her PCP today.  She was evaluated in the office and referred to the emergency department for further diagnostic evaluation.  Patient denies problems with recurrent or persistent chest pain.  She has not been having any exertional chest pain.  No fever chills or upper respiratory symptoms at least for several months or more.  No lower extremity swelling or calf pain.  No history of DVT or PE.  No recent travel no hormonal agents.  Patient does take hydrochlorothiazide for hypertension.  She is a non-smoker.  Patient's mother started developing some cardiac problems in her 86s.  Patient has 2 siblings with no cardiac or significant medical history. Past Medical History:  Diagnosis Date  . Abnormal Pap smear 1995  . Allergy    seasonal  . AMA (advanced maternal age) multigravida 6+   . Anemia   . Blood transfusion 2006   [place]  . 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 H/O chlamydia infection 04/07/93  . H/O infertility 2004  . H/O polydactyly   . Heartburn in pregnancy   . Hx of hyperprolactinemia 08/2002  . Hypertension   . Irregular periods/menstrual cycles   . Microadenoma ([place]) 08/2010  . Obesity   . Personal history of colonic adenomas 04/21/2010  .  Uterine fibroid 10/1999    Patient Active Problem List   Diagnosis Date Noted  . Left shoulder pain 10/03/2017  . PCP NOTES >>> 02/17/2015  . Fluttering sensation of heart 07/03/2013  . Acid reflux 07/03/2013  . Family history of colon cancer - father in 32's 03/24/2013  . Annual physical exam 10/04/2012  . HTN (hypertension) 09/14/2011  . Status post repeat low transverse cesarean section 09/11/2011  . Anemia 09/11/2011  . H/O myomectomy 08/11/2011  . Previous cesarean delivery, antepartum condition or complication 42/35/3614  . Morbid obesity ([place]) 07/26/2011  . Back pain 07/27/2010  . Personal history of colonic adenomas 04/21/2010    Past Surgical History:  Procedure Laterality Date  . CESAREAN SECTION     x2  . COLONOSCOPY    . MYOMECTOMY  07/2003  . TUBAL LIGATION    . UTERINE FIBROID SURGERY  2005   myomectomy x 1     OB History    Gravida  2   Para  2   Term  1   Preterm      AB      Living  2     SAB      TAB      Ectopic      Multiple      Live Births  2            Home Medications    Prior  to Admission medications   Medication Sig Start Date End Date Taking? Authorizing Provider  aspirin 81 MG chewable tablet Chew 1 tablet (81 mg total) by mouth daily. 05/18/18   Charlesetta Shanks, MD  pantoprazole (PROTONIX) 20 MG tablet Take 1 tablet (20 mg total) by mouth daily. 05/18/18   Charlesetta Shanks, MD  triamterene-hydrochlorothiazide (MAXZIDE-25) 37.5-25 MG tablet Take 1 tablet by mouth daily. 05/30/17   Colon Branch, MD    Family History Family History  Problem Relation Age of Onset  . Colon cancer Father 63          . Hypertension Father   . Colon polyps Brother        x 2  . Hypertension Mother   . Diabetes Mother   . Colon polyps Sister   . Coronary artery disease Other        GM, aunt  . Diabetes Sister   . Heart disease Maternal Grandmother   . Diabetes Maternal Grandmother   . Breast cancer Neg Hx   . Esophageal cancer Neg Hx    . Rectal cancer Neg Hx   . Stomach cancer Neg Hx     Social History Social History   Tobacco Use  . Smoking status: Never Smoker  . Smokeless tobacco: Never Used  Substance Use Topics  . Alcohol use: Yes    Comment: socially  . Drug use: No     Allergies   Patient has no known allergies.   Review of Systems Review of Systems 10 Systems reviewed and are negative for acute change except as noted in the HPI.   Physical Exam Updated Vital Signs BP (!) 154/84 (BP Location: Right Arm)   Pulse 79   Temp 97.9 F (36.6 C) (Oral)   Resp 16   Ht 5\' 5"  (1.651 m)   Wt 127.5 kg   LMP 04/20/2018 (Exact Date)   SpO2 100%   BMI 46.76 kg/m   Physical Exam Constitutional:      Appearance: She is well-developed.  HENT:     Head: Normocephalic and atraumatic.     Mouth/Throat:     Mouth: Mucous membranes are moist.  Eyes:     Extraocular Movements: Extraocular movements intact.  Neck:     Musculoskeletal: Neck supple.  Cardiovascular:     Rate and Rhythm: Normal rate and regular rhythm.     Heart sounds: Normal heart sounds.  Pulmonary:     Effort: Pulmonary effort is normal.     Breath sounds: Normal breath sounds.  Abdominal:     General: Bowel sounds are normal. There is no distension.     Palpations: Abdomen is soft.     Tenderness: There is no abdominal tenderness.  Musculoskeletal: Normal range of motion.        General: No swelling or tenderness.     Right lower leg: No edema.     Left lower leg: No edema.  Skin:    General: Skin is warm and dry.  Neurological:     Mental Status: She is alert and oriented to person, place, and time.     GCS: GCS eye subscore is 4. GCS verbal subscore is 5. GCS motor subscore is 6.     Coordination: Coordination normal.      ED Treatments / Results  Labs (all labs ordered are listed, but only abnormal results are displayed) Labs Reviewed  COMPREHENSIVE METABOLIC PANEL - Abnormal; Notable for the following components:  Result Value   Sodium 134 (*)    Calcium 8.7 (*)    Albumin 3.3 (*)    AST 13 (*)    All other components within normal limits  CBC WITH DIFFERENTIAL/PLATELET - Abnormal; Notable for the following components:   Hemoglobin 10.8 (*)    HCT 33.0 (*)    All other components within normal limits  LIPASE, BLOOD  TROPONIN I    EKG EKG Interpretation  Date/Time:  Thursday May 18 2018 15:05:17 EST Ventricular Rate:  78 PR Interval:    QRS Duration: 91 QT Interval:  400 QTC Calculation: 456 R Axis:   3 Text Interpretation:  Sinus rhythm Baseline wander in lead(s) II normal, no old comparison Confirmed by Charlesetta Shanks [phone]) on 05/18/2018 4:08:25 PM   Radiology Dg Chest 2 View  Result Date: 05/18/2018 CLINICAL DATA:  Chest tightness EXAM: CHEST - 2 VIEW COMPARISON:  04/23/2013 FINDINGS: The heart size and mediastinal contours are within normal limits. Both lungs are clear. The visualized skeletal structures are unremarkable. IMPRESSION: No active cardiopulmonary disease. Electronically Signed   By: Donavan Foil M.D.   On: 05/18/2018 15:49    Procedures Procedures (including critical care time)  Medications Ordered in ED Medications - No data to display   Initial Impression / Assessment and Plan / ED Course  I have reviewed the triage vital signs and the nursing notes.  Pertinent labs & imaging results that were available during my care of the patient were reviewed by me and considered in my medical decision making (see chart for details).    Patient had episode of chest discomfort yesterday at home.  It had a tightness and a burning quality to it.  EKG nonischemic in appearance.  Troponin negative.  History slightly to moderately suspicious.  Heart score 2-3.  Patient is non-smoker, no early onset cardiac disease in family, no history of exertional chest pain or shortness of breath.  At this time I feel patient stable to continue outpatient management with outpatient  stress testing.  He is counseled to start Protonix and a daily baby aspirin.  She can continue these until further diagnostic evaluation determine if medications are helpful or indicated into the future after recheck with her PCP.  Final Clinical Impressions(s) / ED Diagnoses   Final diagnoses:  Chest pain, unspecified type  Essential hypertension    ED Discharge Orders         Ordered    pantoprazole (PROTONIX) 20 MG tablet  Daily     05/18/18 1626    aspirin 81 MG chewable tablet  Daily     05/18/18 1626           Charlesetta Shanks, MD 05/18/18 1636

## 2018-05-18 NOTE — ED Notes (Signed)
Unsuccessful IV attempt x 1- 2nd RN to bedside to assess

## 2018-06-15 ENCOUNTER — Other Ambulatory Visit: Payer: Self-pay | Admitting: Internal Medicine

## 2018-06-22 ENCOUNTER — Telehealth: Payer: Self-pay

## 2018-06-22 NOTE — Telephone Encounter (Signed)
Please defer this phone call to our manager.  It is the discretion of the provider to decide whether the work-up should be conducted at the office or at the emergency room.

## 2018-06-22 NOTE — Telephone Encounter (Signed)
Copied from [place] [id number]. Topic: General - Other >> Jun 22, 2018  9:18 AM Carolyn Stare wrote:  Pt call and is questioning about being sent to the ER from the office that has cause her to received a lot of bills ,Pt would like a call back from the office manager  .

## 2018-08-04 NOTE — Telephone Encounter (Signed)
Patient calling back checking on the status of message below, please advise  ° ° ° ° ° ° °

## 2018-08-08 ENCOUNTER — Telehealth: Payer: Self-pay

## 2018-08-08 NOTE — Telephone Encounter (Signed)
Copied from [place] [phone]. Topic: General - Other >> Aug 08, 2018  3:22 PM Yvette Rack wrote: Reason for CRM: Pt called in requesting to speak with office manager. Pt stated she has left a message before but she never received a return call. Pt stated she has questions about her January office visit and she requests a call back from Glass blower/designer. Cb# [phone]

## 2018-08-11 NOTE — Telephone Encounter (Signed)
SWP regarding her OV in January which led to an ED visit.  Pt has a high deductible insurance plan and has concerns over the bill and felt as if the ER visit was redundant to the [place].  She was under the impression the only thing she was to have done in the ER was a troponin test.  I explained to the patient we do not routinely send patients to the ER unless absolutely deemed necessary by the provider and her OV caused concern enough for the provider to advise her to go to the ER.

## 2018-09-14 ENCOUNTER — Other Ambulatory Visit: Payer: Self-pay | Admitting: Internal Medicine

## 2018-10-04 ENCOUNTER — Encounter: Payer: Self-pay | Admitting: Family Medicine

## 2018-10-04 ENCOUNTER — Other Ambulatory Visit: Payer: Self-pay

## 2018-10-04 ENCOUNTER — Ambulatory Visit (INDEPENDENT_AMBULATORY_CARE_PROVIDER_SITE_OTHER): Payer: Managed Care, Other (non HMO) | Admitting: Family Medicine

## 2018-10-04 VITALS — BP 126/82 | HR 81 | Temp 98.2°F | Resp 16 | Ht 65.0 in | Wt 289.0 lb

## 2018-10-04 DIAGNOSIS — M79661 Pain in right lower leg: Secondary | ICD-10-CM | POA: Diagnosis not present

## 2018-10-04 DIAGNOSIS — R202 Paresthesia of skin: Secondary | ICD-10-CM

## 2018-10-04 NOTE — Patient Instructions (Addendum)
Heat (pad or rice pillow in microwave) over affected area, 10-15 minutes twice daily.   OK to take Tylenol 1000 mg (2 extra strength tabs) or 975 mg (3 regular strength tabs) every 6 hours as needed.  Give [place] 2-3 business days to get the results of your labs back.   I don't think this is a clot. If you get worsening swelling, let me know.   Stretching and range of motion exercises These exercises warm up your muscles and joints and improve the movement and flexibility of your lower leg. These exercises also help to relieve pain and stiffness.  Exercise A: Gastrocnemius stretch 1. Sit with your left / right leg extended. 2. Loop a belt or towel around the ball of your left / right foot. The ball of your foot is on the walking surface, right under your toes. 3. Hold both ends of the belt or towel. 4. Keep your left / right ankle and foot relaxed and keep your knee straight while you use the belt or towel to pull your foot and ankle toward you. Stop at the first point of resistance. 5. Hold this position for 30 seconds. Repeat 2 times. Complete this exercise 3 times per week.  Exercise B: Ankle alphabet 1. Sit with your left / right leg supported at the lower leg. ? Do not rest your foot on anything. ? Make sure your foot has room to move freely. 2. Think of your left / right foot as a paintbrush, and move your foot to trace each letter of the alphabet in the air. Keep your hip and knee still while you trace. 3. Trace every letter from A to Z. Repeat 2 times. Complete this exercise 3 times per week.  Strengthening exercises These exercises build strength and endurance in your lower leg. Endurance is the ability to use your muscles for a long time, even after they get tired.  Exercise C: Plantar flexors with band 1. Sit with your left / right leg extended. 2. Loop a rubber exercise band or tube around the ball of your left / right foot. The ball of your foot is on the walking surface,  right under your toes. 3. While holding both ends of the band or tube, slowly point your toes downward, pushing them away from you. 4. Hold this position for 3 seconds. 5. Slowly return your foot to the starting position and repeat for a total of 10 repetitions. Repeat 2 times. Complete this exercise 3 times per week.  Exercise D: Plantar flexors, standing 1. Stand with your feet shoulder-width apart. 2. Place your hands on a wall or table to steady yourself as needed, but try not to use it very much for support. 3. Rise up on your toes. 4. If this exercise is too easy, try these options: ? Shift your weight toward your left / right leg until you feel challenged. ? If told by your health care provider, stand on your left / right foot only. 5. Hold this position for 3 seconds. 6. Repeat for a total of 10 repetitions. Repeat 2 times. Complete this exercise 3 times per week.  Exercise E: Plantar flexors, eccentric 1. Stand on the balls of your feet on the edge of a step. The ball of your foot is on the walking surface, right under your toes. 2. Place your hands on a wall or railing for balance as needed, but try not to lean on it for support. 3. Rise up on your toes,  using both legs to help. 4. Slowly shift all of your weight to your left / right foot and lift your other foot off the step. 5. Slowly lower your left / right heel so it drops below the level of the step. You will feel a slight stretch in your left / right calf. 6. Put your other foot back onto the step. Repeat 2 times. Complete this exercise 3 times per week. This information is not intended to replace advice given to you by your health care provider. Make sure you discuss any questions you have with your health care provider.

## 2018-10-04 NOTE — Progress Notes (Signed)
Musculoskeletal Exam  Patient: Felicia Monroe DOB: 1971-02-15  DOS: 10/04/2018  SUBJECTIVE:  Chief Complaint:   Chief Complaint  Patient presents with  . Leg Pain    right leg pain, worse with walking, hx of vericose vein, leg feels heavy, knot in leg 3 weeks ago, bruising cold sensation in toes    Felicia Monroe is a 48 y.o.  female for evaluation and treatment of RLE pain.   Onset:  5 days ago. No inj, has gotten new shoes and been walking more freq Location: Inner R knee/calf area Character:  aching  Progression of issue:  is unchanged Associated symptoms: more prominent varicose veins Treatment: to date has been rest.   Neurovascular symptoms: reports paresthesias in R foot; otherwise no weakness or balance issues Denies personal/famhx of clots, recent surgery, prolonged bedrest, recent travel, injury. No new meds.   ROS: Musculoskeletal/Extremities: +RLE pain  Past Medical History:  Diagnosis Date  . Abnormal Pap smear 1995  . Allergy    seasonal  . AMA (advanced maternal age) multigravida 23+   . Anemia   . Blood transfusion 2006   [place]  . 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 H/O chlamydia infection 04/07/93  . H/O infertility 2004  . H/O polydactyly   . Heartburn in pregnancy   . Hx of hyperprolactinemia 08/2002  . Hypertension   . Irregular periods/menstrual cycles   . Microadenoma ([place]) 08/2010  . Obesity   . Personal history of colonic adenomas 04/21/2010  . Uterine fibroid 10/1999    Objective: VITAL SIGNS: BP 126/82 (BP Location: Left Wrist, Patient Position: Sitting, Cuff Size: Normal)   Pulse 81   Temp 98.2 F (36.8 C) (Oral)   Resp 16   Ht 5\' 5"  (1.651 m)   Wt 289 lb (131.1 kg)   SpO2 97%   BMI 48.09 kg/m  Constitutional: Well formed, well developed. No acute distress. Cardiovascular: Brisk cap refill Thorax & Lungs: No accessory muscle use Musculoskeletal: RLE.   Normal active range of motion: yes.   Normal passive range of motion:  yes Tenderness to palpation: Yes, over entire gastroc Deformity: no Ecchymosis: no Tests positive: none Tests negative: Homan's Neurologic: Normal sensory function. No focal deficits noted.  Psychiatric: Normal mood. Age appropriate judgment and insight. Alert & oriented x 3.    Assessment:  Paresthesia - Plan: TSH, T4, free, CBC, Comprehensive metabolic panel, Magnesium, B12  Right calf pain  Plan: Ck labs. Unlikely to be clot given lack of risk factors, actual cause for pain. I think her new shoes and increased activity could be causing her issue. Heat, Tylenol, calf stretches/exercises provided. Cont w supportive shoes. Let us know if worsening pain or swelling. Ck labs for paresthesias, though this could be compression from new shoes that are currently breaking in.  F/u in 2 weeks w Dr. Larose Kells. The patient voiced understanding and agreement to the plan.   [place], DO 10/04/18  4:36 PM

## 2018-10-05 LAB — COMPREHENSIVE METABOLIC PANEL
ALT: 11 U/L (ref 0–35)
AST: 13 U/L (ref 0–37)
Albumin: 3.7 g/dL (ref 3.5–5.2)
Alkaline Phosphatase: 71 U/L (ref 39–117)
BUN: 11 mg/dL (ref 6–23)
CO2: 29 mEq/L (ref 19–32)
Calcium: 9.2 mg/dL (ref 8.4–10.5)
Chloride: 101 mEq/L (ref 96–112)
Creatinine, Ser: 0.67 mg/dL (ref 0.40–1.20)
GFR: 112.78 mL/min (ref >60.00)
Glucose, Bld: 86 mg/dL (ref 70–99)
Potassium: 3.5 mEq/L (ref 3.5–5.1)
Sodium: 138 mEq/L (ref 135–145)
Total Bilirubin: 0.6 mg/dL (ref 0.2–1.2)
Total Protein: 7.2 g/dL (ref 6.0–8.3)

## 2018-10-05 LAB — VITAMIN B12: Vitamin B-12: 262 pg/mL (ref 211–911)

## 2018-10-05 LAB — CBC
HCT: 33.4 % — ABNORMAL LOW (ref 36.0–46.0)
Hemoglobin: 11.1 g/dL — ABNORMAL LOW (ref 12.0–15.0)
MCHC: 33.3 g/dL (ref 30.0–36.0)
MCV: 81.2 fl (ref 78.0–100.0)
Platelets: 319 10*3/uL (ref 150.0–400.0)
RBC: 4.11 Mil/uL (ref 3.87–5.11)
RDW: 14.3 % (ref 11.5–15.5)
WBC: 7.9 10*3/uL (ref 4.0–10.5)

## 2018-10-05 LAB — T4, FREE: Free T4: 0.79 ng/dL (ref 0.60–1.60)

## 2018-10-05 LAB — TSH: TSH: 2.38 u[IU]/mL (ref 0.35–4.50)

## 2018-10-05 LAB — MAGNESIUM: Magnesium: 1.8 mg/dL (ref 1.5–2.5)

## 2018-10-10 ENCOUNTER — Ambulatory Visit: Payer: Managed Care, Other (non HMO) | Admitting: Internal Medicine

## 2019-05-10 LAB — HM PAP SMEAR: HM Pap smear: NORMAL

## 2019-05-10 LAB — HM MAMMOGRAPHY

## 2019-05-18 ENCOUNTER — Encounter: Payer: Managed Care, Other (non HMO) | Admitting: Internal Medicine

## 2019-09-10 ENCOUNTER — Other Ambulatory Visit: Payer: Self-pay | Admitting: Internal Medicine

## 2019-10-05 LAB — TSH: TSH: 1.61 (ref <=5.90)

## 2019-10-13 ENCOUNTER — Other Ambulatory Visit: Payer: Self-pay | Admitting: Internal Medicine

## 2019-10-24 ENCOUNTER — Telehealth: Payer: Self-pay | Admitting: Internal Medicine

## 2019-10-24 NOTE — Telephone Encounter (Signed)
Last visit w/ Dr. Larose Kells 04/2018- will need appt for refills.

## 2019-10-24 NOTE — Telephone Encounter (Signed)
Medication:triamterene-hydrochlorothiazide (MAXZIDE-25) 37.5-25 MG tablet   Has the patient contacted their pharmacy? Yes.   (If no, request that the patient contact the pharmacy for the refill.) (If yes, when and what did the pharmacy advise?)  Preferred Pharmacy (with phone number or street name):   [id number] - [place]  [place], [place] [address]  Phone:  [phone] Fax:  [phone]  Agent: Please be advised that RX refills may take up to 3 business days. We ask that you follow-up with your pharmacy.

## 2019-10-25 ENCOUNTER — Other Ambulatory Visit: Payer: Self-pay

## 2019-10-25 ENCOUNTER — Encounter: Payer: Self-pay | Admitting: Internal Medicine

## 2019-10-25 ENCOUNTER — Ambulatory Visit: Payer: 59 | Admitting: Internal Medicine

## 2019-10-25 VITALS — BP 131/83 | HR 89 | Temp 97.9°F | Resp 18 | Ht 65.0 in | Wt 291.5 lb

## 2019-10-25 DIAGNOSIS — R238 Other skin changes: Secondary | ICD-10-CM | POA: Diagnosis not present

## 2019-10-25 DIAGNOSIS — I10 Essential (primary) hypertension: Secondary | ICD-10-CM | POA: Diagnosis not present

## 2019-10-25 DIAGNOSIS — R233 Spontaneous ecchymoses: Secondary | ICD-10-CM

## 2019-10-25 LAB — BASIC METABOLIC PANEL
BUN: 12 mg/dL (ref 6–23)
CO2: 29 mEq/L (ref 19–32)
Calcium: 9 mg/dL (ref 8.4–10.5)
Chloride: 101 mEq/L (ref 96–112)
Creatinine, Ser: 0.65 mg/dL (ref 0.40–1.20)
GFR: 116.3 mL/min (ref >60.00)
Glucose, Bld: 106 mg/dL — ABNORMAL HIGH (ref 70–99)
Potassium: 3.8 mEq/L (ref 3.5–5.1)
Sodium: 137 mEq/L (ref 135–145)

## 2019-10-25 LAB — CBC WITH DIFFERENTIAL/PLATELET
Basophils Absolute: 0.1 10*3/uL (ref 0.0–0.1)
Basophils Relative: 1.1 % (ref 0.0–3.0)
Eosinophils Absolute: 0.2 10*3/uL (ref 0.0–0.7)
Eosinophils Relative: 2 % (ref 0.0–5.0)
HCT: 32.6 % — ABNORMAL LOW (ref 36.0–46.0)
Hemoglobin: 10.8 g/dL — ABNORMAL LOW (ref 12.0–15.0)
Lymphocytes Relative: 25.9 % (ref 12.0–46.0)
Lymphs Abs: 2.5 10*3/uL (ref 0.7–4.0)
MCHC: 33.2 g/dL (ref 30.0–36.0)
MCV: 82.1 fl (ref 78.0–100.0)
Monocytes Absolute: 0.5 10*3/uL (ref 0.1–1.0)
Monocytes Relative: 5.6 % (ref 3.0–12.0)
Neutro Abs: 6.2 10*3/uL (ref 1.4–7.7)
Neutrophils Relative %: 65.4 % (ref 43.0–77.0)
Platelets: 290 10*3/uL (ref 150.0–400.0)
RBC: 3.98 Mil/uL (ref 3.87–5.11)
RDW: 14.5 % (ref 11.5–15.5)
WBC: 9.5 10*3/uL (ref 4.0–10.5)

## 2019-10-25 LAB — PROTIME-INR
INR: 1.1 ratio — ABNORMAL HIGH (ref 0.8–1.0)
Prothrombin Time: 12.8 s (ref 9.6–13.1)

## 2019-10-25 LAB — APTT: aPTT: 32.5 s (ref 23.4–32.7)

## 2019-10-25 MED ORDER — TRIAMTERENE-HCTZ 37.5-25 MG PO TABS: 1.0000 | ORAL_TABLET | Freq: Every day | ORAL | 0 refills | Status: DC

## 2019-10-25 NOTE — Patient Instructions (Addendum)
COVID-19 Vaccine Information can be found at: [email or web address] For questions related to vaccine distribution or appointments, please email [email or web address] or call [phone].    Check the  blood pressure 2   times a month   BP GOAL is between 110/65 and  135/85. If it is consistently higher or lower, let me know     GO TO THE LAB : Get the blood work     [place], [place] and [place] Come back for a physical exam before September

## 2019-10-25 NOTE — Assessment & Plan Note (Signed)
Routine visit HTN: BP today is normal, no ambulatory BPs, Check a BMP, continue Maxide. Easy bruising: As described above, check a CBC, PT/PTT. Knee pain: Seen by Dr. Delilah Shan Hyperprolactinemia: Follow-up by gynecology Preventive care: Due for a CPX, recommend to schedule at her convenience. Has not taken the Covid vaccination, patient educated: pros>>cons RTC at her convenience for CPX

## 2019-10-25 NOTE — Progress Notes (Signed)
Subjective:    Patient ID: Felicia Monroe, female    DOB: 06/11/1970, 49 y.o.   MRN: [id number]  DOS:  10/25/2019 Type of visit - description: Routine checkup  HTN: Needs refill of medications, no ambulatory BPs. Has noted easy bruising for a while, happen few times a year without a injury; most recent one was at the right arm where she has a knot.  Denies any unusual gum bleeding, no nosebleeds.  Menses are gone.   Review of Systems See above   Past Medical History:  Diagnosis Date  . Abnormal Pap smear 1995  . Allergy    seasonal  . AMA (advanced maternal age) multigravida 22+   . Anemia   . Blood transfusion 2006   [place]  . 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 H/O chlamydia infection 04/07/93  . H/O infertility 2004  . H/O polydactyly   . Heartburn in pregnancy   . Hx of hyperprolactinemia 08/2002  . Hypertension   . Irregular periods/menstrual cycles   . Microadenoma 08/2010  . Obesity   . Personal history of colonic adenomas 04/21/2010  . Uterine fibroid 10/1999    Past Surgical History:  Procedure Laterality Date  . CESAREAN SECTION     x2  . COLONOSCOPY    . MYOMECTOMY  07/2003  . TUBAL LIGATION    . UTERINE FIBROID SURGERY  2005   myomectomy x 1    Allergies as of 10/25/2019   No Known Allergies     Medication List       Accurate as of October 25, 2019  8:57 AM. If you have any questions, ask your nurse or doctor.        triamterene-hydrochlorothiazide 37.5-25 MG tablet Commonly known as: MAXZIDE-25 Take 1 tablet by mouth daily.          Objective:   Physical Exam Musculoskeletal:       Arms:    BP 131/83 (BP Location: Left Arm, Patient Position: Sitting, Cuff Size: Normal)   Pulse 89   Temp 97.9 F (36.6 C) (Temporal)   Resp 18   Ht 5\' 5"  (1.651 m)   Wt 291 lb 8 oz (132.2 kg)   SpO2 94%   BMI 48.51 kg/m  General:   Well developed, NAD, BMI noted. HEENT:  Normocephalic . Face symmetric, atraumatic Lungs:  CTA B Normal respiratory  effort, no intercostal retractions, no accessory muscle use. Heart: RRR,  no murmur.  Lower extremities: no pretibial edema bilaterally  Skin: Not pale. Not jaundice Neurologic:  alert & oriented X3.  Speech normal, gait appropriate for age and unassisted Psych--  Cognition and judgment appear intact.  Cooperative with normal attention span and concentration.  Behavior appropriate. No anxious or depressed appearing.      Assessment     Assessment  HTN Morbid Obesity  GYN: Dr Raphael Gibney  Infertility, irregular periods, uterine fibroids, abnormal Pap smear, history of chlamydia and condyloma, H/o hyperprolactinemia 2004 and microadenomas 2012 (dx per gyn?) BTL  Seasonal allergies +FH colon ca father age 21, + colon polyps  PLAN Routine visit HTN: BP today is normal, no ambulatory BPs, Check a BMP, continue Maxide. Easy bruising: As described above, check a CBC, PT/PTT. Knee pain: Seen by Dr. Delilah Shan Hyperprolactinemia: Follow-up by gynecology Preventive care: Due for a CPX, recommend to schedule at her convenience. Has not taken the Covid vaccination, patient educated: pros>>cons RTC at her convenience for CPX   This visit occurred during the SARS-CoV-2 public  health emergency.  Safety protocols were in place, including screening questions prior to the visit, additional usage of staff PPE, and extensive cleaning of exam room while observing appropriate contact time as indicated for disinfecting solutions.

## 2019-10-25 NOTE — Progress Notes (Signed)
Pre visit review using our clinic review tool, if applicable. No additional management support is needed unless otherwise documented below in the visit note. 

## 2019-11-16 ENCOUNTER — Encounter: Payer: Self-pay | Admitting: Internal Medicine

## 2020-01-20 ENCOUNTER — Other Ambulatory Visit: Payer: Self-pay | Admitting: Internal Medicine

## 2020-01-24 ENCOUNTER — Other Ambulatory Visit: Payer: Self-pay | Admitting: Internal Medicine

## 2020-01-24 ENCOUNTER — Encounter: Payer: 59 | Admitting: Internal Medicine

## 2020-04-24 ENCOUNTER — Telehealth: Payer: Self-pay | Admitting: Internal Medicine

## 2020-04-24 MED ORDER — TRIAMTERENE-HCTZ 37.5-25 MG PO TABS: 1.0000 | ORAL_TABLET | Freq: Every day | ORAL | 0 refills | Status: DC

## 2020-04-24 NOTE — Telephone Encounter (Signed)
triamterene-hydrochlorothiazide (MAXZIDE-25) 37.5-25 MG tablet  

## 2020-04-24 NOTE — Telephone Encounter (Signed)
PCP wanted her seen for her cpe at her earliest convenience (see last note)- I'll refill for another 30 days but she does need to schedule a cpe.

## 2020-04-24 NOTE — Telephone Encounter (Signed)
Caller : Natara  Call Back # [phone]  Patient called in reference to medication refill denial. Patient advised to make an appointment for further eval. Patient declined the appointment offer and stated she was just seen in July 2021.

## 2020-05-19 ENCOUNTER — Telehealth: Payer: Self-pay

## 2020-05-19 NOTE — Telephone Encounter (Signed)
Please check on her tomorrow, if she continue with severe symptoms:  ER, if not we could see her (Virtually if you suspect Covid)

## 2020-05-19 NOTE — Telephone Encounter (Signed)
Nurse Assessment Nurse: Windle Guard, RN, Lesa Date/Time Eilene Ghazi Time): 05/19/2020 10:28:37 AM Confirm and document reason for call. If symptomatic, describe symptoms. ---Caller states she has cough, chest pain, back pain and shoulder pain Does the patient have any new or worsening symptoms? ---Yes Will a triage be completed? ---Yes Related visit to physician within the last 2 weeks? ---No Does the PT have any chronic conditions? (i.e. diabetes, asthma, this includes High risk factors for pregnancy, etc.) ---Yes List chronic conditions. ---HTN Is the patient pregnant or possibly pregnant? (Ask all females between the ages of 62-55) ---No Is this a behavioral health or substance abuse call? ---No Guidelines Guideline Title Affirmed Question Affirmed Notes Nurse Date/Time (Eastern Time) COVID-19 - Diagnosed or Suspected SEVERE or constant chest pain or pressure (Exception: mild central chest pain, present only when coughing) Conner, RN, Lesa 05/19/2020 10:30:24 AM Disp. Time Eilene Ghazi Time) Disposition Final User 05/19/2020 10:26:03 AM Send to Urgent Queue Knepfle, Madisyn PLEASE NOTE: All timestamps contained within this report are represented as [place] Standard Time. CONFIDENTIALTY NOTICE: This fax transmission is intended only for the addressee. It contains information that is legally privileged, confidential or otherwise protected from use or disclosure. If you are not the intended recipient, you are strictly prohibited from reviewing, disclosing, copying using or disseminating any of this information or taking any action in reliance on or regarding this information. If you have received this fax in error, please notify us immediately by telephone so that we can arrange for its return to [place]. Phone: [phone], Toll-Free: [phone], Fax: [phone] Page: 2 of 2 Call Id: [id number] 05/19/2020 10:32:09 AM Go to ED Now Yes Windle Guard, RN, Lesa Caller Disagree/Comply Comply Caller  Understands Yes PreDisposition Did not know what to do Care Advice Given Per Guideline GO TO ED NOW: * You need to be seen in the Emergency Department. * Go to the ED at ___________ [place] THAT YOU MIGHT HAVE COVID-19: * Tell the first healthcare worker you meet that you may have COVID-19. WEAR A MASK - COVER YOUR MOUTH AND NOSE: * Wear a mask. ANOTHER ADULT SHOULD DRIVE: * It is better and safer if another adult drives instead of you. * Severe difficulty breathing occurs CALL EMS 911 IF: * Lips or face turns blue * Confusion occurs. CARE ADVICE given per COVID-19 - DIAGNOSED OR SUSPECTED (Adult) guideline. Referrals GO TO FACILITY OTHER - SPECIFY  FYI- I don't see where Pt has gone to ED.

## 2020-05-20 NOTE — Telephone Encounter (Signed)
LMOM informing Pt to return call.  

## 2020-05-21 ENCOUNTER — Other Ambulatory Visit: Payer: Self-pay | Admitting: Internal Medicine

## 2020-06-10 ENCOUNTER — Ambulatory Visit: Payer: 59 | Admitting: Internal Medicine

## 2020-06-10 ENCOUNTER — Other Ambulatory Visit: Payer: Self-pay

## 2020-06-10 ENCOUNTER — Encounter: Payer: Self-pay | Admitting: Internal Medicine

## 2020-06-10 VITALS — BP 129/88 | HR 71 | Temp 97.7°F | Ht 65.0 in | Wt 292.0 lb

## 2020-06-10 DIAGNOSIS — I10 Essential (primary) hypertension: Secondary | ICD-10-CM

## 2020-06-10 DIAGNOSIS — R079 Chest pain, unspecified: Secondary | ICD-10-CM

## 2020-06-10 DIAGNOSIS — D500 Iron deficiency anemia secondary to blood loss (chronic): Secondary | ICD-10-CM | POA: Diagnosis not present

## 2020-06-10 MED ORDER — TRIAMTERENE-HCTZ 37.5-25 MG PO TABS: 1.0000 | ORAL_TABLET | Freq: Every day | ORAL | 1 refills | Status: DC

## 2020-06-10 NOTE — Progress Notes (Signed)
Subjective:    Patient ID: Felicia Monroe, female    DOB: Dec 24, 1970, 50 y.o.   MRN: [id number]  DOS:  06/10/2020 Type of visit - description: Acute 1 month history of chest pain described as pressure at the mid upper anterior chest. No radiation but sometimes is associated with upper back pain. Symptoms have been almost daily, they last 5 minutes. No associated nausea, diaphoresis, cough, wheezing. Always at rest, is her observation that sometimes is worse when she is stressed out.  She also said that her stress from work and family issues has increased tremendously lately.  She is very sedentary; other than home chores and going shopping she does not do any exercises.  No exertional symptoms.  History of anemia, last year had 2 menses only.  Review of Systems Denies fever chills No lower extremity edema or calf pain No GERD symptoms or abdominal pain   Past Medical History:  Diagnosis Date  . Abnormal Pap smear 1995  . Allergy    seasonal  . AMA (advanced maternal age) multigravida 38+   . Anemia   . Blood transfusion 2006   [place]  . 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 H/O chlamydia infection 04/07/93  . H/O infertility 2004  . H/O polydactyly   . Heartburn in pregnancy   . Hx of hyperprolactinemia 08/2002  . Hypertension   . Irregular periods/menstrual cycles   . Microadenoma 08/2010  . Obesity   . Personal history of colonic adenomas 04/21/2010  . Uterine fibroid 10/1999    Past Surgical History:  Procedure Laterality Date  . CESAREAN SECTION     x2  . COLONOSCOPY    . MYOMECTOMY  07/2003  . TUBAL LIGATION    . UTERINE FIBROID SURGERY  2005   myomectomy x 1    Allergies as of 06/10/2020   No Known Allergies     Medication List       Accurate as of June 10, 2020 11:59 PM. If you have any questions, ask your nurse or doctor.        aspirin EC 81 MG tablet Take 81 mg by mouth daily. Swallow whole.   triamterene-hydrochlorothiazide 37.5-25 MG  tablet Commonly known as: MAXZIDE-25 Take 1 tablet by mouth daily.          Objective:   Physical Exam BP 129/88 (BP Location: Left Arm, Patient Position: Sitting, Cuff Size: Large)   Pulse 71   Temp 97.7 F (36.5 C) (Oral)   Ht 5\' 5"  (1.651 m)   Wt 292 lb (132.5 kg)   SpO2 100%   BMI 48.59 kg/m  General:   Well developed, NAD, BMI noted.  HEENT:  Normocephalic . Face symmetric, atraumatic Lungs:  CTA B Normal respiratory effort, no intercostal retractions, no accessory muscle use. Heart: RRR,  no murmur.  Abdomen:  Not distended, soft, non-tender. No rebound or rigidity.   Skin: Not pale. Not jaundice Lower extremities: no pretibial edema bilaterally  Neurologic:  alert & oriented X3.  Speech normal, gait appropriate for age and unassisted Psych--  Cognition and judgment appear intact.  Cooperative with normal attention span and concentration.  Behavior appropriate. No anxious or depressed appearing.     Assessment     Assessment  HTN Morbid Obesity  GYN: Dr Raphael Gibney  Infertility, irregular periods, uterine fibroids H/o abnormal Pap smear, chlamydia and condyloma, H/o hyperprolactinemia 2004 and microadenomas 2012 (dx per gyn?) BTL  Seasonal allergies +FH colon ca father age 59, +  colon polyps  PLAN Chest pain: Patient is a female, 50 years old, history of HTN, morbid obesity.  Non-smoker, no FH of CAD on immediate family members.  Last LDL on no medications : 51. EKG today: Discussed with DOD no acute findings that require immediate intervention.  NSR. CP etiology not completely clear, could be anxiety related but CAD is also in the differential. Plan:  Start aspirin daily BMP, CBC, lipid panel, refer to cardiology, likely will benefit from a coronary calcium score. ER if severe or different symptoms HTN: Diastolic BP today slightly elevated, continue Maxide, labs, monitor BPs at home, reassess in 3 months.   Anemia:  mild chronic anemia, last  colonoscopy 2019 (negative but has a personal FH history of polyps, next 2024), previously had DUB, now patient reports only 2 menses over the last year.  Checking an iron panel. RTC 3 months   This visit occurred during the SARS-CoV-2 public health emergency.  Safety protocols were in place, including screening questions prior to the visit, additional usage of staff PPE, and extensive cleaning of exam room while observing appropriate contact time as indicated for disinfecting solutions.

## 2020-06-10 NOTE — Progress Notes (Signed)
Pre visit review using our clinic review tool, if applicable. No additional management support is needed unless otherwise documented below in the visit note. 

## 2020-06-10 NOTE — Patient Instructions (Addendum)
Check the  blood pressure 2 or 3 times a week BP GOAL is between 110/65 and  135/85. Start aspirin 81 mg Call or go to the ER if severe or different chest pain GO TO THE LAB : Get the blood work   [place], [place] back for a checkup in 3 months    Advance Directive  Advance directives are legal documents that allow you to make decisions about your health care and medical treatment in case you become unable to communicate for yourself. Advance directives let your wishes be known to family, friends, and health care providers. Discussing and writing advance directives should happen over time rather than all at once. Advance directives can be changed and updated at any time. There are different types of advance directives, such as:  Medical power of attorney.  Living will.  Do not resuscitate (DNR) order or do not attempt resuscitation (DNAR) order. Health care proxy and medical power of attorney A health care proxy is also called a health care agent. This person is appointed to make medical decisions for you when you are unable to make decisions for yourself. Generally, people ask a trusted friend or family member to act as their proxy and represent their preferences. Make sure you have an agreement with your trusted person to act as your proxy. A proxy may have to make a medical decision on your behalf if your wishes are not known. A medical power of attorney, also called a durable power of attorney for health care, is a legal document that names your health care proxy. Depending on the laws in your state, the document may need to be:  Signed.  Notarized.  Dated.  Copied.  Witnessed.  Incorporated into your medical record. You may also want to appoint a trusted person to manage your money in the event you are unable to do so. This is called a durable power of attorney for finances. It is a separate legal document from the durable power of  attorney for health care. You may choose your health care proxy or someone different to act as your agent in money matters. If you do not appoint a proxy, or there is a concern that the proxy is not acting in your best interest, a court may appoint a guardian to act on your behalf. Living will A living will is a set of instructions that state your wishes about medical care when you cannot express them yourself. Health care providers should keep a copy of your living will in your medical record. You may want to give a copy to family members or friends. To alert caregivers in case of an emergency, you can place a card in your wallet to let them know that you have a living will and where they can find it. A living will is used if you become:  Terminally ill.  Disabled.  Unable to communicate or make decisions. The following decisions should be included in your living will:  To use or not to use life support equipment, such as dialysis machines and breathing machines (ventilators).  Whether you want a DNR or DNAR order. This tells health care providers not to use cardiopulmonary resuscitation (CPR) if breathing or heartbeat stops.  To use or not to use tube feeding.  To be given or not to be given food and fluids.  Whether you want comfort (palliative) care when the goal becomes comfort rather than a cure.  Whether  you want to donate your organs and tissues. A living will does not give instructions for distributing your money and property if you should pass away. DNR or DNAR A DNR or DNAR order is a request not to have CPR in the event that your heart stops beating or you stop breathing. If a DNR or DNAR order has not been made and shared, a health care provider will try to help any patient whose heart has stopped or who has stopped breathing. If you plan to have surgery, talk with your health care provider about how your DNR or DNAR order will be followed if problems occur. What if I do not  have an advance directive? Some states assign family decision makers to act on your behalf if you do not have an advance directive. Each state has its own laws about advance directives. You may want to check with your health care provider, attorney, or state representative about the laws in your state. Summary  Advance directives are legal documents that allow you to make decisions about your health care and medical treatment in case you become unable to communicate for yourself.  The process of discussing and writing advance directives should happen over time. You can change and update advance directives at any time.  Advance directives may include a medical power of attorney, a living will, and a DNR or DNAR order. This information is not intended to replace advice given to you by your health care provider. Make sure you discuss any questions you have with your health care provider. Document Revised: 01/15/2020 Document Reviewed: 01/15/2020 Elsevier Patient Education  2021 [employer].

## 2020-06-11 NOTE — Assessment & Plan Note (Signed)
Chest pain: Patient is a female, 50 years old, history of HTN, morbid obesity.  Non-smoker, no FH of CAD on immediate family members.  Last LDL on no medications : 51. EKG today: Discussed with DOD no acute findings that require immediate intervention.  NSR. CP etiology not completely clear, could be anxiety related but CAD is also in the differential. Plan:  Start aspirin daily BMP, CBC, lipid panel, refer to cardiology, likely will benefit from a coronary calcium score. ER if severe or different symptoms HTN: Diastolic BP today slightly elevated, continue Maxide, labs, monitor BPs at home, reassess in 3 months.   Anemia:  mild chronic anemia, last colonoscopy 2019 (negative but has a personal FH history of polyps, next 2024), previously had DUB, now patient reports only 2 menses over the last year.  Checking an iron panel. RTC 3 months

## 2020-06-16 ENCOUNTER — Other Ambulatory Visit (INDEPENDENT_AMBULATORY_CARE_PROVIDER_SITE_OTHER): Payer: 59

## 2020-06-16 DIAGNOSIS — D500 Iron deficiency anemia secondary to blood loss (chronic): Secondary | ICD-10-CM | POA: Diagnosis not present

## 2020-06-16 DIAGNOSIS — I10 Essential (primary) hypertension: Secondary | ICD-10-CM | POA: Diagnosis not present

## 2020-06-16 NOTE — Progress Notes (Signed)
No charge, redraw. 

## 2020-06-17 LAB — CBC WITH DIFFERENTIAL/PLATELET
Basophils Absolute: 0.1 10*3/uL (ref 0.0–0.1)
Basophils Relative: 0.8 % (ref 0.0–3.0)
Eosinophils Absolute: 0.1 10*3/uL (ref 0.0–0.7)
Eosinophils Relative: 1.8 % (ref 0.0–5.0)
HCT: 33.8 % — ABNORMAL LOW (ref 36.0–46.0)
Hemoglobin: 11.3 g/dL — ABNORMAL LOW (ref 12.0–15.0)
Lymphocytes Relative: 35.3 % (ref 12.0–46.0)
Lymphs Abs: 2.7 10*3/uL (ref 0.7–4.0)
MCHC: 33.4 g/dL (ref 30.0–36.0)
MCV: 80.7 fl (ref 78.0–100.0)
Monocytes Absolute: 0.5 10*3/uL (ref 0.1–1.0)
Monocytes Relative: 6.1 % (ref 3.0–12.0)
Neutro Abs: 4.2 10*3/uL (ref 1.4–7.7)
Neutrophils Relative %: 56 % (ref 43.0–77.0)
Platelets: 335 10*3/uL (ref 150.0–400.0)
RBC: 4.19 Mil/uL (ref 3.87–5.11)
RDW: 14.5 % (ref 11.5–15.5)
WBC: 7.6 10*3/uL (ref 4.0–10.5)

## 2020-06-17 LAB — BASIC METABOLIC PANEL
BUN: 11 mg/dL (ref 6–23)
CO2: 29 mEq/L (ref 19–32)
Calcium: 9.4 mg/dL (ref 8.4–10.5)
Chloride: 101 mEq/L (ref 96–112)
Creatinine, Ser: 0.73 mg/dL (ref 0.40–1.20)
GFR: 95.1 mL/min (ref >60.00)
Glucose, Bld: 120 mg/dL — ABNORMAL HIGH (ref 70–99)
Potassium: 4 mEq/L (ref 3.5–5.1)
Sodium: 138 mEq/L (ref 135–145)

## 2020-06-17 LAB — LIPID PANEL
Cholesterol: 120 mg/dL (ref 0–200)
HDL: 51.2 mg/dL (ref >39.00)
LDL Cholesterol: 54 mg/dL (ref 0–99)
NonHDL: 68.41
Total CHOL/HDL Ratio: 2
Triglycerides: 74 mg/dL (ref 0.0–149.0)
VLDL: 14.8 mg/dL (ref 0.0–40.0)

## 2020-06-17 LAB — IRON: Iron: 41 ug/dL — ABNORMAL LOW (ref 42–145)

## 2020-06-17 LAB — FERRITIN: Ferritin: 115.6 ng/mL (ref 10.0–291.0)

## 2020-06-20 DIAGNOSIS — Z8776 Personal history of (corrected) congenital malformations of integument, limbs and musculoskeletal system: Secondary | ICD-10-CM | POA: Insufficient documentation

## 2020-06-20 DIAGNOSIS — Z87768 Personal history of other specified (corrected) congenital malformations of integument, limbs and musculoskeletal system: Secondary | ICD-10-CM | POA: Insufficient documentation

## 2020-06-20 DIAGNOSIS — A630 Anogenital (venereal) warts: Secondary | ICD-10-CM | POA: Insufficient documentation

## 2020-06-20 DIAGNOSIS — I10 Essential (primary) hypertension: Secondary | ICD-10-CM | POA: Insufficient documentation

## 2020-06-20 DIAGNOSIS — E669 Obesity, unspecified: Secondary | ICD-10-CM | POA: Insufficient documentation

## 2020-06-20 DIAGNOSIS — T7840XA Allergy, unspecified, initial encounter: Secondary | ICD-10-CM | POA: Insufficient documentation

## 2020-06-20 DIAGNOSIS — O26899 Other specified pregnancy related conditions, unspecified trimester: Secondary | ICD-10-CM | POA: Insufficient documentation

## 2020-06-20 DIAGNOSIS — O09529 Supervision of elderly multigravida, unspecified trimester: Secondary | ICD-10-CM | POA: Insufficient documentation

## 2020-06-27 ENCOUNTER — Ambulatory Visit: Payer: 59 | Admitting: Cardiology

## 2020-07-08 ENCOUNTER — Ambulatory Visit: Payer: 59 | Admitting: Cardiology

## 2020-07-18 ENCOUNTER — Other Ambulatory Visit: Payer: Self-pay

## 2020-07-18 ENCOUNTER — Encounter: Payer: Self-pay | Admitting: Cardiology

## 2020-07-18 ENCOUNTER — Ambulatory Visit: Payer: 59 | Admitting: Cardiology

## 2020-07-18 VITALS — BP 122/90 | HR 82 | Ht 65.0 in | Wt 289.1 lb

## 2020-07-18 DIAGNOSIS — R600 Localized edema: Secondary | ICD-10-CM | POA: Diagnosis not present

## 2020-07-18 DIAGNOSIS — E8881 Metabolic syndrome: Secondary | ICD-10-CM

## 2020-07-18 DIAGNOSIS — R011 Cardiac murmur, unspecified: Secondary | ICD-10-CM

## 2020-07-18 DIAGNOSIS — I10 Essential (primary) hypertension: Secondary | ICD-10-CM

## 2020-07-18 DIAGNOSIS — R0789 Other chest pain: Secondary | ICD-10-CM

## 2020-07-18 DIAGNOSIS — R079 Chest pain, unspecified: Secondary | ICD-10-CM | POA: Diagnosis not present

## 2020-07-18 MED ORDER — NITROGLYCERIN 0.4 MG SL SUBL: 0.4000 mg | SUBLINGUAL_TABLET | SUBLINGUAL | 3 refills | Status: DC | PRN

## 2020-07-18 MED ORDER — METOPROLOL TARTRATE 100 MG PO TABS: ORAL_TABLET | ORAL | 0 refills | Status: DC

## 2020-07-18 NOTE — Patient Instructions (Addendum)
Medication Instructions:  Your physician has recommended you make the following change in your medication: START: Nitroglycerin 0.4 mg take one tablet by mouth every 5 minutes up to three times as needed for chest pain.  *If you need a refill on your cardiac medications before your next appointment, please call your pharmacy*   Lab Work: Your physician recommends that you return for lab work: TODAY: HbA1C 3-7 days before CT: BMET, Mag If you have labs (blood work) drawn today and your tests are completely normal, you will receive your results only by: Marland Kitchen MyChart Message (if you have MyChart) OR . A paper copy in the mail If you have any lab test that is abnormal or we need to change your treatment, we will call you to review the results.   Testing/Procedures: Your physician has requested that you have an echocardiogram. Echocardiography is a painless test that uses sound waves to create images of your heart. It provides your doctor with information about the size and shape of your heart and how well your heart's chambers and valves are working. This procedure takes approximately one hour. There are no restrictions for this procedure.  Your cardiac CT will be scheduled at:  [facility] [address] [place], [place] [address] [phone]    If scheduled at [facility], please arrive at the [facility] main entrance (entrance A) of [facility] 30 minutes prior to test start time. Proceed to the [facility] Radiology Department (first floor) to check-in and test prep.   Please follow these instructions carefully (unless otherwise directed):  On the Night Before the Test: . Be sure to Drink plenty of water. . Do not consume any caffeinated/decaffeinated beverages or chocolate 12 hours prior to your test. . Do not take any antihistamines 12 hours prior to your test.  On the Day of the Test: . Drink plenty of water until 1 hour prior to the test. . Do  not eat any food 4 hours prior to the test. . You may take your regular medications prior to the test.  . Take metoprolol (Lopressor) two hours prior to test. . HOLD Hydrochlorothiazide morning of the test. . FEMALES- please wear underwire-free bra if available       After the Test: . Drink plenty of water. . After receiving IV contrast, you may experience a mild flushed feeling. This is normal. . On occasion, you may experience a mild rash up to 24 hours after the test. This is not dangerous. If this occurs, you can take Benadryl 25 mg and increase your fluid intake. . If you experience trouble breathing, this can be serious. If it is severe call 911 IMMEDIATELY. If it is mild, please call our office.   Once we have confirmed authorization from your insurance company, we will call you to set up a date and time for your test. Based on how quickly your insurance processes prior authorizations requests, please allow up to 4 weeks to be contacted for scheduling your Cardiac CT appointment. Be advised that routine Cardiac CT appointments could be scheduled as many as 8 weeks after your provider has ordered it.  For non-scheduling related questions, please contact the cardiac imaging nurse navigator should you have any questions/concerns: Marchia Bond, Cardiac Imaging Nurse Navigator Gordy Clement, Cardiac Imaging Nurse Navigator [facility] Direct Office Dial: [phone]   For scheduling needs, including cancellations and rescheduling, please call [place], [phone].   Follow-Up: At [facility], you and your health needs are our priority.  As part of our continuing mission to provide you with exceptional heart care, we have created designated Provider Care Teams.  These Care Teams include your primary Cardiologist (physician) and Advanced Practice Providers (APPs -  Physician Assistants and Nurse Practitioners) who all work together to provide you with the  care you need, when you need it.  We recommend signing up for the patient portal called "MyChart".  Sign up information is provided on this After Visit Summary.  MyChart is used to connect with patients for Virtual Visits (Telemedicine).  Patients are able to view lab/test results, encounter notes, upcoming appointments, etc.  Non-urgent messages can be sent to your provider as well.   To learn more about what you can do with MyChart, go to [email or web address].    Your next appointment:   3 month(s)  The format for your next appointment:   In Person  Provider:   Berniece Salines, DO   Other Instructions  Echocardiogram An echocardiogram is a test that uses sound waves (ultrasound) to produce images of the heart. Images from an echocardiogram can provide important information about:  Heart size and shape.  The size and thickness and movement of your heart's walls.  Heart muscle function and strength.  Heart valve function or if you have stenosis. Stenosis is when the heart valves are too narrow.  If blood is flowing backward through the heart valves (regurgitation).  A tumor or infectious growth around the heart valves.  Areas of heart muscle that are not working well because of poor blood flow or injury from a heart attack.  Aneurysm detection. An aneurysm is a weak or damaged part of an artery wall. The wall bulges out from the normal force of blood pumping through the body. Tell a health care provider about:  Any allergies you have.  All medicines you are taking, including vitamins, herbs, eye drops, creams, and over-the-counter medicines.  Any blood disorders you have.  Any surgeries you have had.  Any medical conditions you have.  Whether you are pregnant or may be pregnant. What are the risks? Generally, this is a safe test. However, problems may occur, including an allergic reaction to dye (contrast) that may be used during the test. What happens before the  test? No specific preparation is needed. You may eat and drink normally. What happens during the test?  You will take off your clothes from the waist up and put on a hospital gown.  Electrodes or electrocardiogram (ECG)patches may be placed on your chest. The electrodes or patches are then connected to a device that monitors your heart rate and rhythm.  You will lie down on a table for an ultrasound exam. A gel will be applied to your chest to help sound waves pass through your skin.  A handheld device, called a transducer, will be pressed against your chest and moved over your heart. The transducer produces sound waves that travel to your heart and bounce back (or "echo" back) to the transducer. These sound waves will be captured in real-time and changed into images of your heart that can be viewed on a video monitor. The images will be recorded on a computer and reviewed by your health care provider.  You may be asked to change positions or hold your breath for a short time. This makes it easier to get different views or better views of your heart.  In some cases, you may receive contrast  through an IV in one of your veins. This can improve the quality of the pictures from your heart. The procedure may vary among health care providers and hospitals.   What can I expect after the test? You may return to your normal, everyday life, including diet, activities, and medicines, unless your health care provider tells you not to do that. Follow these instructions at home:  It is up to you to get the results of your test. Ask your health care provider, or the department that is doing the test, when your results will be ready.  Keep all follow-up visits. This is important. Summary  An echocardiogram is a test that uses sound waves (ultrasound) to produce images of the heart.  Images from an echocardiogram can provide important information about the size and shape of your heart, heart muscle  function, heart valve function, and other possible heart problems.  You do not need to do anything to prepare before this test. You may eat and drink normally.  After the echocardiogram is completed, you may return to your normal, everyday life, unless your health care provider tells you not to do that. This information is not intended to replace advice given to you by your health care provider. Make sure you discuss any questions you have with your health care provider. Document Revised: 12/04/2019 Document Reviewed: 12/04/2019 Elsevier Patient Education  2021 [employer].

## 2020-07-18 NOTE — Progress Notes (Signed)
Cardiology Office Note:    Date:  07/18/2020   ID:  Felicia Monroe, DOB [birth year], MRN [id number]  PCP:  Colon Branch, MD  Cardiologist:  Berniece Salines, DO  Electrophysiologist:  None   Referring MD: Colon Branch, MD   I have had some chest discomfort recently  History of Present Illness:    Felicia Monroe is a 50 y.o. female with a hx of hypertension, morbid obesity, GERD presents today to be evaluated at the request of her primary care doctor.  The patient has had intermittent chest pressure.  She described it as a pressure-like sensation which came on lasted for 3 to 5 minutes and then resolved.  She notes that when she was much younger she was told she had chest spasm those were tightness this time it was different and was a pressure-like sensation.  She tells me during that time she was under a lot of stress as well.  No lightheadedness no dizziness.  Past Medical History:  Diagnosis Date  . Abnormal Pap smear 1995  . Acid reflux 07/03/2013  . Allergy    seasonal  . AMA (advanced maternal age) multigravida 97+   . Anemia   . Annual physical exam 10/04/2012  . Back pain 07/27/2010  . Blood transfusion 2006   [place]  . 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 Family history of colon cancer - father in 9's 03/24/2013   Brother and sister also have polyps   . Fluttering sensation of heart 07/03/2013  . H/O chlamydia infection 04/07/93  . H/O infertility 2004  . H/O myomectomy 08/11/2011   For repeat cesarean section   . H/O polydactyly   . Heartburn in pregnancy   . HTN (hypertension) 09/14/2011  . Hx of hyperprolactinemia 08/2002  . Hypertension   . Irregular periods/menstrual cycles   . Left shoulder pain 10/03/2017  . Microadenoma 08/2010  . Morbid obesity ([place]) 07/26/2011  . Obesity   . Personal history of colonic adenomas 04/21/2010  . Previous cesarean delivery, antepartum condition or complication 3/71/0626   For repeat cesarean 09/10/11 AVS   . Status post repeat low  transverse cesarean section 09/11/2011  . Uterine fibroid 10/1999    Past Surgical History:  Procedure Laterality Date  . CESAREAN SECTION     x2  . COLONOSCOPY    . MYOMECTOMY  07/2003  . TUBAL LIGATION    . UTERINE FIBROID SURGERY  2005   myomectomy x 1    Current Medications: Current Meds  Medication Sig  . aspirin EC 81 MG tablet Take 81 mg by mouth daily. Swallow whole.  . metoprolol tartrate (LOPRESSOR) 100 MG tablet Take 2 hours prior to CT  . nitroGLYCERIN (NITROSTAT) 0.4 MG SL tablet Place 1 tablet (0.4 mg total) under the tongue every 5 (five) minutes as needed for chest pain.  Marland Kitchen triamterene-hydrochlorothiazide (MAXZIDE-25) 37.5-25 MG tablet Take 1 tablet by mouth daily.     Allergies:   Patient has no known allergies.   Social History   Socioeconomic History  . Marital status: Married    Spouse name: Not on file  . Number of children: 2  . Years of education: Not on file  . Highest education level: Not on file  Occupational History  . 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 Smoking status: Never Smoker  . Smokeless tobacco: Never Used  Vaping Use  . Vaping Use: Never used  Substance and Sexual Activity  .  Alcohol use: Yes    Comment: socially  . Drug use: No  . Sexual activity: Not Currently    Birth control/protection: Surgical    Comment: BTL  Other Topics Concern  . Not on file  Social History Narrative   household- pt, husband, children   Two children  2006, 2013   Social Determinants of Radio broadcast assistant Strain: Not on [employer] Insecurity: Not on file  Transportation Needs: Not on file  Physical Activity: Not on file  Stress: Not on file  Social Connections: Not on file     Family History: The patient's family history includes Colon cancer (age of onset: 1) in her father; Colon polyps in her brother and sister; Coronary artery disease in an other family member; Diabetes in her maternal grandmother, mother, and  sister; Heart disease in her maternal grandmother; Hypertension in her father and mother. There is no history of Breast cancer, Esophageal cancer, Rectal cancer, or Stomach cancer.  ROS:   Review of Systems  Constitution: Negative for decreased appetite, fever and weight gain.  HENT: Negative for congestion, ear discharge, hoarse voice and sore throat.   Eyes: Negative for discharge, redness, vision loss in right eye and visual halos.  Cardiovascular: Negative for chest pain, dyspnea on exertion, leg swelling, orthopnea and palpitations.  Respiratory: Negative for cough, hemoptysis, shortness of breath and snoring.   Endocrine: Negative for heat intolerance and polyphagia.  Hematologic/Lymphatic: Negative for bleeding problem. Does not bruise/bleed easily.  Skin: Negative for flushing, nail changes, rash and suspicious lesions.  Musculoskeletal: Negative for arthritis, joint pain, muscle cramps, myalgias, neck pain and stiffness.  Gastrointestinal: Negative for abdominal pain, bowel incontinence, diarrhea and excessive appetite.  Genitourinary: Negative for decreased libido, genital sores and incomplete emptying.  Neurological: Negative for brief paralysis, focal weakness, headaches and loss of balance.  Psychiatric/Behavioral: Negative for altered mental status, depression and suicidal ideas.  Allergic/Immunologic: Negative for HIV exposure and persistent infections.    EKGs/Labs/Other Studies Reviewed:    The following studies were reviewed today:   EKG:  The ekg ordered today demonstrates sinus rhythm, heart rate 82 bpm with LVH by aVL criteria.  Recent Labs: 10/05/2019: TSH 1.61 06/16/2020: BUN 11; Creatinine, Ser 0.73; Hemoglobin 11.3; Platelets 335.0; Potassium 4.0; Sodium 138  Recent Lipid Panel    Component Value Date/Time   CHOL 120 06/16/2020 1541   TRIG 74.0 06/16/2020 1541   HDL 51.20 06/16/2020 1541   CHOLHDL 2 06/16/2020 1541   VLDL 14.8 06/16/2020 1541   LDLCALC 54  06/16/2020 1541    Physical Exam:    VS:  BP 122/90 (BP Location: Right Arm)   Pulse 82   Ht 5\' 5"  (1.651 m)   Wt 289 lb 1.3 oz (131.1 kg)   SpO2 97%   BMI 48.11 kg/m     Wt Readings from Last 3 Encounters:  07/18/20 289 lb 1.3 oz (131.1 kg)  06/10/20 292 lb (132.5 kg)  10/25/19 291 lb 8 oz (132.2 kg)     GEN: Well nourished, well developed in no acute distress HEENT: Normal NECK: No JVD; No carotid bruits LYMPHATICS: No lymphadenopathy CARDIAC: S1S2 noted,RRR, 2/6 mid ejection systolic murmurs, rubs, gallops RESPIRATORY:  Clear to auscultation without rales, wheezing or rhonchi  ABDOMEN: Soft, non-tender, non-distended, +bowel sounds, no guarding. EXTREMITIES: +1 bilateral leg edema, No cyanosis, no clubbing MUSCULOSKELETAL:  No deformity  SKIN: Warm and dry NEUROLOGIC:  Alert and oriented x 3, non-focal PSYCHIATRIC:  Normal affect,  good insight  ASSESSMENT:    1. Chest discomfort   2. Metabolic syndrome   3. Hypertension, unspecified type   4. Chest pain, unspecified type   5. Bilateral leg edema   6. Murmur    PLAN:     Her chest pain is concerning patient does have intermediate risk for coronary artery disease would not like to do is pursue an ischemic evaluation in this patient.  Shared decision a coronary CTA at this time is appropriate.  I have discussed with the patient about the testing.  The patient has no IV contrast allergy and is agreeable to proceed with this test.  Sublingual nitroglycerin prescription was sent, its protocol and 911 protocol explained and the patient vocalized understanding questions were answered to the patient's satisfaction  In addition her physical exam revealed 2 out of 6 mid ejection systolic murmur with her bilateral leg edema like to get an echocardiogram assess her LV function.  She does have some diastolic hypertension today.  We talked about decreasing salt intake and also watching her diet and transitioning to a more  healthy lifestyle.  She also plans to increase her exercise.  I offered consultation with our healthy weight management program but for now she has declined.  She has metabolic syndrome we will get a hemoglobin A1c today as well.   The patient is in agreement with the above plan. The patient left the office in stable condition.  The patient will follow up in 3 months or sooner if needed.   Medication Adjustments/Labs and Tests Ordered: Current medicines are reviewed at length with the patient today.  Concerns regarding medicines are outlined above.  Orders Placed This Encounter  Procedures  . CT CORONARY MORPH W/CTA COR W/SCORE W/CA W/CM &/OR WO/CM  . CT CORONARY FRACTIONAL FLOW RESERVE DATA PREP  . CT CORONARY FRACTIONAL FLOW RESERVE FLUID ANALYSIS  . Basic metabolic panel  . Magnesium  . Hemoglobin A1c  . EKG 12-Lead  . ECHOCARDIOGRAM COMPLETE   Meds ordered this encounter  Medications  . metoprolol tartrate (LOPRESSOR) 100 MG tablet    Sig: Take 2 hours prior to CT    Dispense:  1 tablet    Refill:  0  . nitroGLYCERIN (NITROSTAT) 0.4 MG SL tablet    Sig: Place 1 tablet (0.4 mg total) under the tongue every 5 (five) minutes as needed for chest pain.    Dispense:  90 tablet    Refill:  3    Patient Instructions   Medication Instructions:  Your physician has recommended you make the following change in your medication: START: Nitroglycerin 0.4 mg take one tablet by mouth every 5 minutes up to three times as needed for chest pain.  *If you need a refill on your cardiac medications before your next appointment, please call your pharmacy*   Lab Work: Your physician recommends that you return for lab work: TODAY: HbA1C 3-7 days before CT: BMET, Mag If you have labs (blood work) drawn today and your tests are completely normal, you will receive your results only by: Marland Kitchen MyChart Message (if you have MyChart) OR . A paper copy in the mail If you have any lab test that is  abnormal or we need to change your treatment, we will call you to review the results.   Testing/Procedures: Your physician has requested that you have an echocardiogram. Echocardiography is a painless test that uses sound waves to create images of your heart. It provides your doctor with information  about the size and shape of your heart and how well your heart's chambers and valves are working. This procedure takes approximately one hour. There are no restrictions for this procedure.  Your cardiac CT will be scheduled at:  [facility] [address] [place], [place] [address] [phone]    If scheduled at [facility], please arrive at the [facility] main entrance (entrance A) of [facility] 30 minutes prior to test start time. Proceed to the [facility] Radiology Department (first floor) to check-in and test prep.   Please follow these instructions carefully (unless otherwise directed):  On the Night Before the Test: . Be sure to Drink plenty of water. . Do not consume any caffeinated/decaffeinated beverages or chocolate 12 hours prior to your test. . Do not take any antihistamines 12 hours prior to your test.  On the Day of the Test: . Drink plenty of water until 1 hour prior to the test. . Do not eat any food 4 hours prior to the test. . You may take your regular medications prior to the test.  . Take metoprolol (Lopressor) two hours prior to test. . HOLD Hydrochlorothiazide morning of the test. . FEMALES- please wear underwire-free bra if available       After the Test: . Drink plenty of water. . After receiving IV contrast, you may experience a mild flushed feeling. This is normal. . On occasion, you may experience a mild rash up to 24 hours after the test. This is not dangerous. If this occurs, you can take Benadryl 25 mg and increase your fluid intake. . If you experience trouble breathing, this can be serious. If it is severe call 911  IMMEDIATELY. If it is mild, please call our office.   Once we have confirmed authorization from your insurance company, we will call you to set up a date and time for your test. Based on how quickly your insurance processes prior authorizations requests, please allow up to 4 weeks to be contacted for scheduling your Cardiac CT appointment. Be advised that routine Cardiac CT appointments could be scheduled as many as 8 weeks after your provider has ordered it.  For non-scheduling related questions, please contact the cardiac imaging nurse navigator should you have any questions/concerns: Marchia Bond, Cardiac Imaging Nurse Navigator Gordy Clement, Cardiac Imaging Nurse Navigator [facility] Direct Office Dial: [phone]   For scheduling needs, including cancellations and rescheduling, please call [place], [phone].   Follow-Up: At [facility], you and your health needs are our priority.  As part of our continuing mission to provide you with exceptional heart care, we have created designated Provider Care Teams.  These Care Teams include your primary Cardiologist (physician) and Advanced Practice Providers (APPs -  Physician Assistants and Nurse Practitioners) who all work together to provide you with the care you need, when you need it.  We recommend signing up for the patient portal called "MyChart".  Sign up information is provided on this After Visit Summary.  MyChart is used to connect with patients for Virtual Visits (Telemedicine).  Patients are able to view lab/test results, encounter notes, upcoming appointments, etc.  Non-urgent messages can be sent to your provider as well.   To learn more about what you can do with MyChart, go to [email or web address].    Your next appointment:   3 month(s)  The format for your next appointment:   In Person  Provider:   Berniece Salines, DO  Other Instructions  Echocardiogram An echocardiogram is a test  that uses sound waves (ultrasound) to produce images of the heart. Images from an echocardiogram can provide important information about:  Heart size and shape.  The size and thickness and movement of your heart's walls.  Heart muscle function and strength.  Heart valve function or if you have stenosis. Stenosis is when the heart valves are too narrow.  If blood is flowing backward through the heart valves (regurgitation).  A tumor or infectious growth around the heart valves.  Areas of heart muscle that are not working well because of poor blood flow or injury from a heart attack.  Aneurysm detection. An aneurysm is a weak or damaged part of an artery wall. The wall bulges out from the normal force of blood pumping through the body. Tell a health care provider about:  Any allergies you have.  All medicines you are taking, including vitamins, herbs, eye drops, creams, and over-the-counter medicines.  Any blood disorders you have.  Any surgeries you have had.  Any medical conditions you have.  Whether you are pregnant or may be pregnant. What are the risks? Generally, this is a safe test. However, problems may occur, including an allergic reaction to dye (contrast) that may be used during the test. What happens before the test? No specific preparation is needed. You may eat and drink normally. What happens during the test?  You will take off your clothes from the waist up and put on a hospital gown.  Electrodes or electrocardiogram (ECG)patches may be placed on your chest. The electrodes or patches are then connected to a device that monitors your heart rate and rhythm.  You will lie down on a table for an ultrasound exam. A gel will be applied to your chest to help sound waves pass through your skin.  A handheld device, called a transducer, will be pressed against your chest and moved over your heart. The transducer produces sound waves that travel to your heart and bounce  back (or "echo" back) to the transducer. These sound waves will be captured in real-time and changed into images of your heart that can be viewed on a video monitor. The images will be recorded on a computer and reviewed by your health care provider.  You may be asked to change positions or hold your breath for a short time. This makes it easier to get different views or better views of your heart.  In some cases, you may receive contrast through an IV in one of your veins. This can improve the quality of the pictures from your heart. The procedure may vary among health care providers and hospitals.   What can I expect after the test? You may return to your normal, everyday life, including diet, activities, and medicines, unless your health care provider tells you not to do that. Follow these instructions at home:  It is up to you to get the results of your test. Ask your health care provider, or the department that is doing the test, when your results will be ready.  Keep all follow-up visits. This is important. Summary  An echocardiogram is a test that uses sound waves (ultrasound) to produce images of the heart.  Images from an echocardiogram can provide important information about the size and shape of your heart, heart muscle function, heart valve function, and other possible heart problems.  You do not need to do anything to prepare before this test. You may eat and  drink normally.  After the echocardiogram is completed, you may return to your normal, everyday life, unless your health care provider tells you not to do that. This information is not intended to replace advice given to you by your health care provider. Make sure you discuss any questions you have with your health care provider. Document Revised: 12/04/2019 Document Reviewed: 12/04/2019 Elsevier Patient Education  2021 [place].      Adopting a Healthy Lifestyle.  Know what a healthy weight is for you (roughly  BMI <25) and aim to maintain this   Aim for 7+ servings of fruits and vegetables daily   65-80+ fluid ounces of water or unsweet tea for healthy kidneys   Limit to max 1 drink of alcohol per day; avoid smoking/tobacco   Limit animal fats in diet for cholesterol and heart health - choose grass fed whenever available   Avoid highly processed foods, and foods high in saturated/trans fats   Aim for low stress - take time to unwind and care for your mental health   Aim for 150 min of moderate intensity exercise weekly for heart health, and weights twice weekly for bone health   Aim for 7-9 hours of sleep daily   When it comes to diets, agreement about the perfect plan isnt easy to find, even among the experts. Experts at the [employer] developed an idea known as the Healthy Eating Plate. Just imagine a plate divided into logical, healthy portions.   The emphasis is on diet quality:   Load up on vegetables and fruits - one-half of your plate: Aim for color and variety, and remember that potatoes dont count.   Go for whole grains - one-quarter of your plate: Whole wheat, barley, wheat berries, quinoa, oats, Hassan rice, and foods made with them. If you want pasta, go with whole wheat pasta.   Protein power - one-quarter of your plate: Fish, chicken, beans, and nuts are all healthy, versatile protein sources. Limit red meat.   The diet, however, does go beyond the plate, offering a few other suggestions.   Use healthy plant oils, such as olive, canola, soy, corn, sunflower and peanut. Check the labels, and avoid partially hydrogenated oil, which have unhealthy trans fats.   If youre thirsty, drink water. Coffee and tea are good in moderation, but skip sugary drinks and limit milk and dairy products to one or two daily servings.   The type of carbohydrate in the diet is more important than the amount. Some sources of carbohydrates, such as vegetables, fruits, whole  grains, and beans-are healthier than others.   Finally, stay active  Signed, Berniece Salines, DO  07/18/2020 2:06 PM    [place]

## 2020-07-19 LAB — HEMOGLOBIN A1C
Est. average glucose Bld gHb Est-mCnc: 128 mg/dL
Hgb A1c MFr Bld: 6.1 % — ABNORMAL HIGH (ref 4.8–5.6)

## 2020-08-14 ENCOUNTER — Encounter: Payer: Self-pay | Admitting: Internal Medicine

## 2020-08-15 ENCOUNTER — Telehealth (HOSPITAL_COMMUNITY): Payer: Self-pay | Admitting: Emergency Medicine

## 2020-08-15 NOTE — Telephone Encounter (Signed)
Reaching out to patient to offer assistance regarding upcoming cardiac imaging study; pt verbalizes understanding of appt date/time, parking situation and where to check in, pre-test NPO status and medications ordered, and verified current allergies; name and call back number provided for further questions should they arise Felicia Bond RN Navigator Cardiac Imaging [facility] [phone] office [phone] cell  100mg  metoprolol tartrate Holding maxide

## 2020-08-19 ENCOUNTER — Encounter (HOSPITAL_COMMUNITY): Payer: Self-pay

## 2020-08-19 ENCOUNTER — Ambulatory Visit (HOSPITAL_COMMUNITY)
Admission: RE | Admit: 2020-08-19 | Discharge: 2020-08-19 | Disposition: A | Discharge status: Home / Self Care | Payer: 59 | Source: Ambulatory Visit | Attending: Cardiology | Admitting: Cardiology

## 2020-08-19 ENCOUNTER — Other Ambulatory Visit: Payer: Self-pay

## 2020-08-19 DIAGNOSIS — R0789 Other chest pain: Secondary | ICD-10-CM

## 2020-08-19 DIAGNOSIS — Z006 Encounter for examination for normal comparison and control in clinical research program: Secondary | ICD-10-CM

## 2020-08-19 DIAGNOSIS — R079 Chest pain, unspecified: Secondary | ICD-10-CM

## 2020-08-19 MED ORDER — NITROGLYCERIN 0.4 MG SL SUBL
SUBLINGUAL_TABLET | SUBLINGUAL | Status: AC
  Filled 2020-08-19: qty 2

## 2020-08-19 MED ORDER — NITROGLYCERIN 0.4 MG SL SUBL
0.8000 mg | SUBLINGUAL_TABLET | Freq: Once | SUBLINGUAL | Status: AC
  Administered 2020-08-19: 0.8 mg via SUBLINGUAL

## 2020-08-19 MED ORDER — IOHEXOL 350 MG/ML SOLN
100.0000 mL | Freq: Once | INTRAVENOUS | Status: AC | PRN
  Administered 2020-08-19: 100 mL via INTRAVENOUS

## 2020-08-19 NOTE — Research (Signed)
IDENTIFY Informed Consent                  Subject Name: Felicia Monroe   Subject met inclusion and exclusion criteria.  The informed consent form, study requirements and expectations were reviewed with the subject and questions and concerns were addressed prior to the signing of the consent form.  The subject verbalized understanding of the trial requirements.  The subject agreed to participate in the IDENTIFY trial and signed the informed consent at 11:36AM on 08/19/20.  The informed consent was obtained prior to performance of any protocol-specific procedures for the subject.  A copy of the signed informed consent was given to the subject and a copy was placed in the subject's medical record.   Gabrielle Crosby , Research Assistant  

## 2020-08-25 ENCOUNTER — Ambulatory Visit (HOSPITAL_BASED_OUTPATIENT_CLINIC_OR_DEPARTMENT_OTHER): Payer: 59

## 2020-08-29 ENCOUNTER — Encounter (HOSPITAL_COMMUNITY): Payer: Self-pay

## 2020-08-29 ENCOUNTER — Ambulatory Visit (HOSPITAL_COMMUNITY)
Admission: RE | Admit: 2020-08-29 | Discharge: 2020-08-29 | Disposition: A | Discharge status: Home / Self Care | Payer: 59 | Source: Ambulatory Visit | Attending: Cardiology | Admitting: Cardiology

## 2020-08-29 ENCOUNTER — Ambulatory Visit (HOSPITAL_BASED_OUTPATIENT_CLINIC_OR_DEPARTMENT_OTHER)
Admission: RE | Admit: 2020-08-29 | Discharge: 2020-08-29 | Disposition: A | Discharge status: Home / Self Care | Payer: 59 | Source: Ambulatory Visit | Attending: Cardiology | Admitting: Cardiology

## 2020-08-29 ENCOUNTER — Other Ambulatory Visit: Payer: Self-pay

## 2020-08-29 DIAGNOSIS — I10 Essential (primary) hypertension: Secondary | ICD-10-CM | POA: Diagnosis not present

## 2020-08-29 DIAGNOSIS — R600 Localized edema: Secondary | ICD-10-CM | POA: Insufficient documentation

## 2020-08-29 DIAGNOSIS — K219 Gastro-esophageal reflux disease without esophagitis: Secondary | ICD-10-CM | POA: Diagnosis not present

## 2020-08-29 DIAGNOSIS — R011 Cardiac murmur, unspecified: Secondary | ICD-10-CM

## 2020-08-29 DIAGNOSIS — I371 Nonrheumatic pulmonary valve insufficiency: Secondary | ICD-10-CM | POA: Insufficient documentation

## 2020-08-29 DIAGNOSIS — I340 Nonrheumatic mitral (valve) insufficiency: Secondary | ICD-10-CM | POA: Diagnosis not present

## 2020-08-29 DIAGNOSIS — E785 Hyperlipidemia, unspecified: Secondary | ICD-10-CM | POA: Diagnosis not present

## 2020-08-29 LAB — ECHOCARDIOGRAM COMPLETE
Area-P 1/2: 3.99 cm2
S' Lateral: 2.5 cm

## 2020-08-29 NOTE — Progress Notes (Signed)
  Echocardiogram 2D Echocardiogram has been performed.  Zia Najera G Dvid Pendry 08/29/2020, 11:19 AM

## 2020-10-13 LAB — HM PAP SMEAR: HM Pap smear: NEGATIVE

## 2020-10-31 ENCOUNTER — Encounter: Payer: Self-pay | Admitting: Cardiology

## 2020-10-31 ENCOUNTER — Ambulatory Visit: Payer: 59 | Admitting: Cardiology

## 2020-10-31 ENCOUNTER — Other Ambulatory Visit: Payer: Self-pay

## 2020-10-31 VITALS — BP 136/90 | HR 90 | Ht 65.0 in | Wt 291.1 lb

## 2020-10-31 DIAGNOSIS — I201 Angina pectoris with documented spasm: Secondary | ICD-10-CM | POA: Insufficient documentation

## 2020-10-31 DIAGNOSIS — I10 Essential (primary) hypertension: Secondary | ICD-10-CM | POA: Diagnosis not present

## 2020-10-31 MED ORDER — AMLODIPINE BESYLATE 2.5 MG PO TABS: 2.5000 mg | ORAL_TABLET | Freq: Every day | ORAL | 3 refills | Status: DC

## 2020-10-31 NOTE — Progress Notes (Signed)
Cardiology Office Note:    Date:  10/31/2020   ID:  Felicia Monroe, DOB 1970/07/16, MRN [id number]  PCP:  Colon Branch, MD  Cardiologist:  Berniece Salines, DO  Electrophysiologist:  None   Referring MD: Colon Branch, MD   " I am still having chest discomfort"  History of Present Illness:    Felicia Monroe is a 50 y.o. female with a hx of hypertension, morbid obesity is here today for a follow up.    I saw the patient on 07/18/2020 at that time she reported experiencing chest pain and shortness of breath. We did send the patient for a CCTA and an echocardiogram. Her testing result was discuss with her prior.   She is here today for a follow up visit. Shortness of breath have improved but she has had intermittent chest discomfort.   Past Medical History:  Diagnosis Date   Abnormal Pap smear 1995   Acid reflux 07/03/2013   Allergy    seasonal   AMA (advanced maternal age) multigravida 35+    Anemia    Annual physical exam 10/04/2012   Back pain 07/27/2010   Blood transfusion 2006   [place]   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    Family history of colon cancer - father in 46's 03/24/2013   Brother and sister also have polyps    Fluttering sensation of heart 07/03/2013   H/O chlamydia infection 04/07/93   H/O infertility 2004   H/O myomectomy 08/11/2011   For repeat cesarean section    H/O polydactyly    Heartburn in pregnancy    HTN (hypertension) 09/14/2011   Hx of hyperprolactinemia 08/2002   Hypertension    Irregular periods/menstrual cycles    Left shoulder pain 10/03/2017   Microadenoma 08/2010   Morbid obesity ([place]) 07/26/2011   Obesity    Personal history of colonic adenomas 04/21/2010   Previous cesarean delivery, antepartum condition or complication 1/93/7902   For repeat cesarean 09/10/11 AVS    Status post repeat low transverse cesarean section 09/11/2011   Uterine fibroid 10/1999    Past Surgical History:  Procedure Laterality Date   CESAREAN SECTION     x2   COLONOSCOPY      MYOMECTOMY  07/2003   TUBAL LIGATION     UTERINE FIBROID SURGERY  2005   myomectomy x 1    Current Medications: Current Meds  Medication Sig   amLODipine (NORVASC) 2.5 MG tablet Take 1 tablet (2.5 mg total) by mouth daily.   nitroGLYCERIN (NITROSTAT) 0.4 MG SL tablet Place 1 tablet (0.4 mg total) under the tongue every 5 (five) minutes as needed for chest pain.   triamterene-hydrochlorothiazide (MAXZIDE-25) 37.5-25 MG tablet Take 1 tablet by mouth daily.     Allergies:   Patient has no known allergies.   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[employer]- [employer]   Tobacco Use   Smoking status: Never   Smokeless tobacco: Never  Vaping Use   Vaping Use: Never used  Substance and Sexual Activity   Alcohol use: Yes    Comment: socially   Drug use: No   Sexual activity: Not Currently    Birth control/protection: Surgical    Comment: BTL  Other Topics Concern  Not on file  Social History Narrative   household- pt, husband, children   Two children  2006, 2013   Social Determinants of Radio broadcast assistant Strain: Not on [employer] Insecurity: Not on file  Transportation Needs: Not on file  Physical Activity: Not on file  Stress: Not on file  Social Connections: Not on file     Family History: The patient's family history includes Colon cancer (age of onset: 34) in her father; Colon polyps in her brother and sister; Coronary artery disease in an other family member; Diabetes in her maternal grandmother, mother, and sister; Heart disease in her maternal grandmother; Hypertension in her father and mother. There is no history of Breast cancer, Esophageal cancer, Rectal cancer, or Stomach cancer.  ROS:   Review of Systems  Constitution: Negative for decreased appetite,  fever and weight gain.  HENT: Negative for congestion, ear discharge, hoarse voice and sore throat.   Eyes: Negative for discharge, redness, vision loss in right eye and visual halos.  Cardiovascular: Negative for chest pain, dyspnea on exertion, leg swelling, orthopnea and palpitations.  Respiratory: Negative for cough, hemoptysis, shortness of breath and snoring.   Endocrine: Negative for heat intolerance and polyphagia.  Hematologic/Lymphatic: Negative for bleeding problem. Does not bruise/bleed easily.  Skin: Negative for flushing, nail changes, rash and suspicious lesions.  Musculoskeletal: Negative for arthritis, joint pain, muscle cramps, myalgias, neck pain and stiffness.  Gastrointestinal: Negative for abdominal pain, bowel incontinence, diarrhea and excessive appetite.  Genitourinary: Negative for decreased libido, genital sores and incomplete emptying.  Neurological: Negative for brief paralysis, focal weakness, headaches and loss of balance.  Psychiatric/Behavioral: Negative for altered mental status, depression and suicidal ideas.  Allergic/Immunologic: Negative for HIV exposure and persistent infections.    EKGs/Labs/Other Studies Reviewed:    The following studies were reviewed today:   EKG:  None today  TTE 2022 IMPRESSIONS   1. Left ventricular ejection fraction, by estimation, is 70 to 75%. The left ventricle has hyperdynamic function. The left ventricle has no  regional wall motion abnormalities. Left ventricular diastolic parameters are indeterminate.   2. Right ventricular systolic function is normal. The right ventricular size is normal.   3. Left atrial size was mildly dilated.   4. The mitral valve is normal in structure. Mild mitral valve regurgitation.   5. The aortic valve is normal in structure. Aortic valve regurgitation is not visualized.   6. The inferior vena cava is normal in size with greater than 50% respiratory variability, suggesting right atrial  pressure of 3 mmHg.   FINDINGS   Left Ventricle: Left ventricular ejection fraction, by estimation, is 70 to 75%. The left ventricle has hyperdynamic function. The left ventricle  has no regional wall motion abnormalities. The left ventricular internal cavity size was normal in size.  There is no left ventricular hypertrophy. Left ventricular diastolic parameters are indeterminate.   Right Ventricle: The right ventricular size is normal. Right vetricular wall thickness was not assessed. Right ventricular systolic function is  normal.   Left Atrium: Left atrial size was mildly dilated.   Right Atrium: Right atrial size was normal in size.   Pericardium: There is no evidence of pericardial effusion.   Mitral Valve: The mitral valve is normal in structure. Mild mitral valve regurgitation.   Tricuspid Valve: The tricuspid valve is normal in structure. Tricuspid valve regurgitation is trivial.   Aortic Valve: The aortic valve is normal in structure. Aortic valve regurgitation is not visualized.  Pulmonic Valve: The pulmonic valve was normal in structure. Pulmonic valve regurgitation is mild.   Aorta: The aortic root and ascending aorta are structurally normal, with no evidence of dilitation.   Venous: The inferior vena cava is normal in size with greater than 50% respiratory variability, suggesting right atrial pressure of 3 mmHg.   IAS/Shunts: No atrial level shunt detected by color flow Doppler.      CCTA 08/19/2020 Aorta:  Normal size.  No calcifications.  No dissection.   Aortic Valve:  Trileaflet.  No calcifications.   Coronary Arteries:  Normal coronary origin.  Right dominance.   RCA is a large dominant artery that gives rise to PDA and PLA. There is no plaque.   Left main is a large artery that gives rise to LAD, Intermedius Ramus and LCX arteries.   LAD is a large vessel that has no plaque.   LCX is a non-dominant artery that gives rise to one large OM1 branch. There  is no plaque.   Other findings:   Normal pulmonary vein drainage into the left atrium.   Normal left atrial appendage without a thrombus.   Normal size of the pulmonary artery.   IMPRESSION: 1. Coronary calcium score of 0. This was 0 percentile for age and sex matched control.   2. Normal coronary origin with right dominance.   3. No evidence of CAD. CAD-RADS 0. No evidence of CAD (0%). Consider non-atherosclerotic causes of chest pain.  Recent Labs: 06/16/2020: BUN 11; Creatinine, Ser 0.73; Hemoglobin 11.3; Platelets 335.0; Potassium 4.0; Sodium 138  Recent Lipid Panel    Component Value Date/Time   CHOL 120 06/16/2020 1541   TRIG 74.0 06/16/2020 1541   HDL 51.20 06/16/2020 1541   CHOLHDL 2 06/16/2020 1541   VLDL 14.8 06/16/2020 1541   LDLCALC 54 06/16/2020 1541    Physical Exam:    VS:  BP 136/90 (BP Location: Right Wrist, Patient Position: Sitting)   Pulse 90   Ht 5\' 5"  (1.651 m)   Wt 291 lb 1.6 oz (132 kg)   SpO2 96%   BMI 48.44 kg/m     Wt Readings from Last 3 Encounters:  10/31/20 291 lb 1.6 oz (132 kg)  07/18/20 289 lb 1.3 oz (131.1 kg)  06/10/20 292 lb (132.5 kg)     GEN: Well nourished, well developed in no acute distress HEENT: Normal NECK: No JVD; No carotid bruits LYMPHATICS: No lymphadenopathy CARDIAC: S1S2 noted,RRR, no murmurs, rubs, gallops RESPIRATORY:  Clear to auscultation without rales, wheezing or rhonchi  ABDOMEN: Soft, non-tender, non-distended, +bowel sounds, no guarding. EXTREMITIES: No edema, No cyanosis, no clubbing MUSCULOSKELETAL:  No deformity  SKIN: Warm and dry NEUROLOGIC:  Alert and oriented x 3, non-focal PSYCHIATRIC:  Normal affect, good insight  ASSESSMENT:    1. Hypertension, unspecified type   2. Morbid obesity ([place])   3. 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  She will  continue her other medications..  Discussed diet and exercise with the patient today.   The patient is in agreement with the above plan. The patient left the office in stable condition.  The patient will follow up in   Medication Adjustments/Labs and Tests Ordered: Current medicines are reviewed at length with the patient today.  Concerns regarding medicines are outlined above.  No orders of the defined types were placed  in this encounter.  Meds ordered this encounter  Medications   amLODipine (NORVASC) 2.5 MG tablet    Sig: Take 1 tablet (2.5 mg total) by mouth daily.    Dispense:  90 tablet    Refill:  3    Patient Instructions  Medication Instructions:  Your physician has recommended you make the following change in your medication:  START: Amlodipine 2.5 mg once daily  *If you need a refill on your cardiac medications before your next appointment, please call your pharmacy*   Lab Work: None If you have labs (blood work) drawn today and your tests are completely normal, you will receive your results only by: [place] (if you have MyChart) OR A paper copy in the mail If you have any lab test that is abnormal or we need to change your treatment, we will call you to review the results.   Testing/Procedures: None   Follow-Up: At [facility], you and your health needs are our priority.  As part of our continuing mission to provide you with exceptional heart care, we have created designated Provider Care Teams.  These Care Teams include your primary Cardiologist (physician) and Advanced Practice Providers (APPs -  Physician Assistants and Nurse Practitioners) who all work together to provide you with the care you need, when you need it.  We recommend signing up for the patient portal called "MyChart".  Sign up information is provided on this After Visit Summary.  MyChart is used to connect with patients for Virtual Visits (Telemedicine).  Patients are able to view  lab/test results, encounter notes, upcoming appointments, etc.  Non-urgent messages can be sent to your provider as well.   To learn more about what you can do with MyChart, go to [email or web address].    Your next appointment:   6 month(s)  The format for your next appointment:   In Person  Provider:   [address] - God[facility] Pick Jeryl Umholtz, DO    Other Instructions  Please send your blood pressure readings in a MyChart message.    Mediterranean Diet A Mediterranean diet refers to food and lifestyle choices that are based on the traditions of countries located on the [employer]. This way of eating has been shown to help prevent certain conditions and improve outcomes forpeople who have chronic diseases, like kidney disease and heart disease. What are tips for following this plan? Lifestyle Cook and eat meals together with your family, when possible. Drink enough fluid to keep your urine clear or pale yellow. Be physically active every day. This includes: Aerobic exercise like running or swimming. Leisure activities like gardening, walking, or housework. Get 7-8 hours of sleep each night. If recommended by your health care provider, drink red wine in moderation. This means 1 glass a day for nonpregnant women and 2 glasses a day for men. A glass of wine equals 5 oz (150 mL). Reading food labels  Check the serving size of packaged foods. For foods such as rice and pasta, the serving size refers to the amount of cooked product, not dry. Check the total fat in packaged foods. Avoid foods that have saturated fat or trans fats. Check the ingredients list for added sugars, such as corn syrup.  Shopping At the grocery store, buy most of your food from the areas near the walls of the store. This includes: Fresh fruits and vegetables (produce). Grains, beans, nuts, and seeds. Some of these may be available in unpackaged forms or large amounts (in bulk). Fresh seafood.  Poultry and  eggs. Low-fat dairy products. Buy whole ingredients instead of prepackaged foods. Buy fresh fruits and vegetables in-season from local farmers markets. Buy frozen fruits and vegetables in resealable bags. If you do not have access to quality fresh seafood, buy precooked frozen shrimp or canned fish, such as tuna, salmon, or sardines. Buy small amounts of raw or cooked vegetables, salads, or olives from the deli or salad bar at your store. Stock your pantry so you always have certain foods on hand, such as olive oil, canned tuna, canned tomatoes, rice, pasta, and beans. Cooking Cook foods with extra-virgin olive oil instead of using butter or other vegetable oils. Have meat as a side dish, and have vegetables or grains as your main dish. This means having meat in small portions or adding small amounts of meat to foods like pasta or stew. Use beans or vegetables instead of meat in common dishes like chili or lasagna. Experiment with different cooking methods. Try roasting or broiling vegetables instead of steaming or sauteing them. Add frozen vegetables to soups, stews, pasta, or rice. Add nuts or seeds for added healthy fat at each meal. You can add these to yogurt, salads, or vegetable dishes. Marinate fish or vegetables using olive oil, lemon juice, garlic, and fresh herbs. Meal planning  Plan to eat 1 vegetarian meal one day each week. Try to work up to 2 vegetarian meals, if possible. Eat seafood 2 or more times a week. Have healthy snacks readily available, such as: Vegetable sticks with hummus. Greek yogurt. Fruit and nut trail mix. Eat balanced meals throughout the week. This includes: Fruit: 2-3 servings a day Vegetables: 4-5 servings a day Low-fat dairy: 2 servings a day Fish, poultry, or lean meat: 1 serving a day Beans and legumes: 2 or more servings a week Nuts and seeds: 1-2 servings a day Whole grains: 6-8 servings a day Extra-virgin olive oil: 3-4 servings a day Limit  red meat and sweets to only a few servings a month  What are my food choices? Mediterranean diet Recommended Grains: Whole-grain pasta. [place] rice. Bulgar wheat. Polenta. Couscous. Whole-wheat bread. [place]. Vegetables: Artichokes. Beets. Broccoli. Cabbage. Carrots. Eggplant. Green beans. Chard. Kale. Spinach. Onions. Leeks. Peas. Squash. Tomatoes. Peppers. Radishes. Fruits: Apples. Apricots. Avocado. Berries. Bananas. Cherries. Dates. Figs. Grapes. Lemons. Melon. Oranges. Peaches. Plums. Pomegranate. Meats and other protein foods: Beans. Almonds. Sunflower seeds. Pine nuts. Peanuts. [place]. Salmon. Scallops. Shrimp. [place]. Tilapia. Clams. Oysters. Eggs. Dairy: Low-fat milk. Cheese. Greek yogurt. Beverages: Water. Red wine. Herbal tea. Fats and oils: Extra virgin olive oil. Avocado oil. Grape seed oil. Sweets and desserts: [place] yogurt with honey. Baked apples. Poached pears. Trail mix. Seasoning and other foods: Basil. Cilantro. Coriander. Cumin. Mint. Parsley. Sage. Rosemary. Tarragon. Garlic. Oregano. Thyme. Pepper. Balsalmic vinegar. Tahini. Hummus. Tomato sauce. Olives. Mushrooms. Limit these Grains: Prepackaged pasta or rice dishes. Prepackaged cereal with added sugar. Vegetables: Deep fried potatoes (french fries). Fruits: Fruit canned in syrup. Meats and other protein foods: Beef. Pork. Lamb. Poultry with skin. Hot dogs. [place] Salines. Dairy: Ice cream. Sour cream. Whole milk. Beverages: Juice. Sugar-sweetened soft drinks. Beer. Liquor and spirits. Fats and oils: Butter. Canola oil. Vegetable oil. Beef fat (tallow). Lard. Sweets and desserts: Cookies. Cakes. Pies. Candy. Seasoning and other foods: Mayonnaise. Premade sauces and marinades. The items listed may not be a complete list. Talk with your dietitian aboutwhat dietary choices are right for you. Summary The Mediterranean diet includes both food and lifestyle choices. Eat a variety of  fresh fruits and vegetables, beans, nuts,  seeds, and whole grains. Limit the amount of red meat and sweets that you eat. Talk with your health care provider about whether it is safe for you to drink red wine in moderation. This means 1 glass a day for nonpregnant women and 2 glasses a day for men. A glass of wine equals 5 oz (150 mL). This information is not intended to replace advice given to you by your health care provider. Make sure you discuss any questions you have with your healthcare provider. Document Revised: 12/11/2015 Document Reviewed: 12/04/2015 Elsevier Patient Education  2020 [place] Following a healthy eating pattern may help you to achieve and maintain a healthy body weight, reduce the risk of chronic disease, and live a long and productive life. It is important to follow a healthy eating pattern at an appropriate calorie level for your body. Your nutritional needs should be metprimarily through food by choosing a variety of nutrient-rich foods. What are tips for following this plan? Reading food labels Read labels and choose the following: Reduced or low sodium. Juices with 100% fruit juice. Foods with low saturated fats and high polyunsaturated and monounsaturated fats. Foods with whole grains, such as whole wheat, cracked wheat, [place] rice, and wild rice. Whole grains that are fortified with folic acid. This is recommended for women who are pregnant or who want to become pregnant. Read labels and avoid the following: Foods with a lot of added sugars. These include foods that contain [place] sugar, corn sweetener, corn syrup, dextrose, fructose, glucose, high-fructose corn syrup, honey, invert sugar, lactose, malt syrup, maltose, molasses, raw sugar, sucrose, trehalose, or turbinado sugar. Do not eat more than the following amounts of added sugar per day: 6 teaspoons (25 g) for women. 9 teaspoons (38 g) for men. Foods that contain processed or refined starches and grains. Refined grain  products, such as white flour, degermed cornmeal, white bread, and white rice. Shopping Choose nutrient-rich snacks, such as vegetables, whole fruits, and nuts. Avoid high-calorie and high-sugar snacks, such as potato chips, fruit snacks, and candy. Use oil-based dressings and spreads on foods instead of solid fats such as butter, stick margarine, or cream cheese. Limit pre-made sauces, mixes, and "instant" products such as flavored rice, instant noodles, and ready-made pasta. Try more plant-protein sources, such as tofu, tempeh, black beans, edamame, lentils, nuts, and seeds. Explore eating plans such as the Mediterranean diet or vegetarian diet. Cooking Use oil to saut or stir-fry foods instead of solid fats such as butter, stick margarine, or lard. Try baking, boiling, grilling, or broiling instead of frying. Remove the fatty part of meats before cooking. Steam vegetables in water or broth. Meal planning  At meals, imagine dividing your plate into fourths: One-half of your plate is fruits and vegetables. One-fourth of your plate is whole grains. One-fourth of your plate is protein, especially lean meats, poultry, eggs, tofu, beans, or nuts. Include low-fat dairy as part of your daily diet.  Lifestyle Choose healthy options in all settings, including home, work, school, restaurants, or stores. Prepare your food safely: Wash your hands after handling raw meats. Keep food preparation surfaces clean by regularly washing with hot, soapy water. Keep raw meats separate from ready-to-eat foods, such as fruits and vegetables. Cook seafood, meat, poultry, and eggs to the recommended internal temperature. Store foods at safe temperatures. In general: Keep cold foods at 71F (4.4C) or below. Keep hot foods at 171F (60C) or above. Keep  your freezer at [facility] (-17.8C) or below. Foods are no longer safe to eat when they have been between the temperatures of 40-140F (4.4-60C) for more than 2  hours. What foods should I eat? Fruits Aim to eat 2 cup-equivalents of fresh, canned (in natural juice), or frozen fruits each day. Examples of 1 cup-equivalent of fruit include 1 small apple, 8large strawberries, 1 cup canned fruit,  cup dried fruit, or 1 cup 100% juice. Vegetables Aim to eat 2-3 cup-equivalents of fresh and frozen vegetables each day, including different varieties and colors. Examples of 1 cup-equivalent of vegetables include 2 medium carrots, 2 cups raw, leafy greens, 1 cup choppedvegetable (raw or cooked), or 1 medium baked potato. Grains Aim to eat 6 ounce-equivalents of whole grains each day. Examples of 1 ounce-equivalent of grains include 1 slice of bread, 1 cup ready-to-eat cereal,3 cups popcorn, or  cup cooked rice, pasta, or cereal. Meats and other proteins Aim to eat 5-6 ounce-equivalents of protein each day. Examples of 1 ounce-equivalent of protein include 1 egg, 1/2 cup nuts or seeds, or 1 tablespoon (16 g) peanut butter. A cut of meat or fish that is the size of a deck of cards is about 3-4 ounce-equivalents. Of the protein you eat each week, try to have at least 8 ounces come from seafood. This includes salmon, trout, herring, and anchovies. Dairy Aim to eat 3 cup-equivalents of fat-free or low-fat dairy each day. Examples of 1 cup-equivalent of dairy include 1 cup (240 mL) milk, 8 ounces (250 g) yogurt,1 ounces (44 g) natural cheese, or 1 cup (240 mL) fortified soy milk. Fats and oils Aim for about 5 teaspoons (21 g) per day. Choose monounsaturated fats, such as canola and olive oils, avocados, peanut butter, and most nuts, or polyunsaturated fats, such as sunflower, corn, and soybean oils, walnuts, pine nuts, sesame seeds, sunflower seeds, and flaxseed. Beverages Aim for six 8-oz glasses of water per day. Limit coffee to three to five 8-oz cups per day. Limit caffeinated beverages that have added calories, such as soda and energy drinks. Limit alcohol intake  to no more than 1 drink a day for nonpregnant women and 2 drinks a day for men. One drink equals 12 oz of beer (355 mL), 5 oz of wine (148 mL), or 1 oz of hard liquor (44 mL). Seasoning and other foods Avoid adding excess amounts of salt to your foods. Try flavoring foods with herbs and spices instead of salt. Avoid adding sugar to foods. Try using oil-based dressings, sauces, and spreads instead of solid fats. This information is based on general [place] nutrition guidelines. For more information, visit BuildDNA.es. Exact amounts may vary based on your nutrition needs. Summary A healthy eating plan may help you to maintain a healthy weight, reduce the risk of chronic diseases, and stay active throughout your life. Plan your meals. Make sure you eat the right portions of a variety of nutrient-rich foods. Try baking, boiling, grilling, or broiling instead of frying. Choose healthy options in all settings, including home, work, school, restaurants, or stores. This information is not intended to replace advice given to you by your health care provider. Make sure you discuss any questions you have with your healthcare provider. Document Revised: 07/25/2017 Document Reviewed: 07/25/2017 Elsevier Patient Education  2022 [place] [facility]-healthy meal planning includes: Eating less unhealthy fats. Eating more healthy fats. Making other changes in your diet. Talk with your doctor or a diet specialist (dietitian) to  create an eating plan that is right for you.  What are tips for following this plan? Cooking Avoid frying your food. Try to bake, boil, grill, or broil it instead. You can also reduce fat by: Removing the skin from poultry. Removing all visible fats from meats. Steaming vegetables in water or broth. Meal planning  At meals, divide your plate into four equal parts: Fill one-half of your plate with vegetables and green salads. Fill one-fourth of  your plate with whole grains. Fill one-fourth of your plate with lean protein foods. Eat 4-5 servings of vegetables per day. A serving of vegetables is: 1 cup of raw or cooked vegetables. 2 cups of raw leafy greens. Eat 4-5 servings of fruit per day. A serving of fruit is: 1 medium whole fruit.  cup of dried fruit.  cup of fresh, frozen, or canned fruit.  cup of 100% fruit juice. Eat more foods that have soluble fiber. These are apples, broccoli, carrots, beans, peas, and barley. Try to get 20-30 g of fiber per day. Eat 4-5 servings of nuts, legumes, and seeds per week: 1 serving of dried beans or legumes equals  cup after being cooked. 1 serving of nuts is  cup. 1 serving of seeds equals 1 tablespoon.  General information Eat more home-cooked food. Eat less restaurant, buffet, and fast food. Limit or avoid alcohol. Limit foods that are high in starch and sugar. Avoid fried foods. Lose weight if you are overweight. Keep track of how much salt (sodium) you eat. This is important if you have high blood pressure. Ask your doctor to tell you more about this. Try to add vegetarian meals each week. Fats Choose healthy fats. These include olive oil and canola oil, flaxseeds, walnuts, almonds, and seeds. Eat more omega-3 fats. These include salmon, mackerel, sardines, tuna, flaxseed oil, and ground flaxseeds. Try to eat fish at least 2 times each week. Check food labels. Avoid foods with trans fats or high amounts of saturated fat. Limit saturated fats. These are often found in animal products, such as meats, butter, and cream. These are also found in plant foods, such as palm oil, palm kernel oil, and coconut oil. Avoid foods with partially hydrogenated oils in them. These have trans fats. Examples are stick margarine, some tub margarines, cookies, crackers, and other baked goods. What foods can I eat? Fruits All fresh, canned (in natural juice), or frozen fruits. Vegetables Fresh  or frozen vegetables (raw, steamed, roasted, or grilled). Green salads. Grains Most grains. Choose whole wheat and whole grains most of the time. Rice andpasta, including [place] rice and pastas made with whole wheat. Meats and other proteins Lean, well-trimmed beef, veal, pork, and lamb. Chicken and [place] without skin. All fish and shellfish. Wild duck, rabbit, pheasant, and venison. Egg whites or low-cholesterol egg substitutes. Dried beans, peas, lentils, and tofu. Seedsand most nuts. Dairy Low-fat or nonfat cheeses, including ricotta and mozzarella. Skim or 1% milk that is liquid, powdered, or evaporated. Buttermilk that is made with low-fatmilk. Nonfat or low-fat yogurt. Fats and oils Non-hydrogenated (trans-free) margarines. Vegetable oils, including soybean, sesame, sunflower, olive, peanut, safflower, corn, canola, and cottonseed. Salad dressings or mayonnaisemade with a vegetable oil. Beverages Mineral water. Coffee and tea. Diet carbonated beverages. Sweets and desserts Sherbet, gelatin, and fruit ice. Small amounts of dark chocolate. Limit all sweets and desserts. Seasonings and condiments All seasonings and condiments. The items listed above may not be a complete list of foods and drinks you can eat. Contact  a dietitian for more options. What foods should I avoid? Fruits Canned fruit in heavy syrup. Fruit in cream or butter sauce. Fried fruit. Limitcoconut. Vegetables Vegetables cooked in cheese, cream, or butter sauce. Fried vegetables. Grains Breads that are made with saturated or trans fats, oils, or whole milk. Croissants. Sweet rolls. Donuts. High-fat crackers,such as cheese crackers. Meats and other proteins Fatty meats, such as hot dogs, ribs, sausage, bacon, rib-eye roast or steak. High-fat deli meats, such as salami and bologna. Caviar. Domestic duck andgoose. Organ meats, such as liver. Dairy Cream, sour cream, cream cheese, and creamed cottage cheese. Whole-milk  cheeses. Whole or 2% milk that is liquid, evaporated, or condensed. Whole buttermilk. Cream sauce or high-fat cheese sauce. Yogurt that is made fromwhole milk. Fats and oils Meat fat, or shortening. Cocoa butter, hydrogenated oils, palm oil, coconut oil, palm kernel oil. Solid fats and shortenings, including bacon fat, salt pork, lard, and butter. Nondairy cream substitutes. Salad dressings with cheeseor sour cream. Beverages Regular sodas and juice drinks with added sugar. Sweets and desserts Frosting. Pudding. Cookies. Cakes. Pies. Milk chocolate or white chocolate.Buttered syrups. Full-fat ice cream or ice cream drinks. The items listed above may not be a complete list of foods and drinks to avoid. Contact a dietitian for more information. Summary Heart-healthy meal planning includes eating less unhealthy fats, eating more healthy fats, and making other changes in your diet. Eat a balanced diet. This includes fruits and vegetables, low-fat or nonfat dairy, lean protein, nuts and legumes, whole grains, and heart-healthy oils and fats. This information is not intended to replace advice given to you by your health care provider. Make sure you discuss any questions you have with your healthcare provider. Document Revised: 06/16/2017 Document Reviewed: 05/20/2017 Elsevier Patient Education  2022 [place] With Less [employer] with less salt is one way to reduce the amount of sodium you get from food. Sodium is one of the elements that make up salt. It is found naturally in foods and is also added to certain foods. Depending on your condition and overall health, your health care provider or dietitian may recommend that you reduce your sodium intake. Most people should have less than 2,300 milligrams (mg) of sodium each day. If you have high blood pressure (hypertension), you may need to limit your sodium to 1,500 mg each day. Follow the tipsbelow to help reduce your sodium intake. What  are tips for eating less sodium? Reading food labels  Check the food label before buying or using packaged ingredients. Always check the label for the serving size and sodium content. Look for products with no more than 140 mg of sodium in one serving. Check the % Daily Value column to see what percent of the daily recommended amount of sodium is provided in one serving of the product. Foods with 5% or less in this column are considered low in sodium. Foods with 20% or higher are considered high in sodium. Do not choose foods with salt as one of the first three ingredients on the ingredients list. If salt is one of the first three ingredients, it usually means the item is high in sodium.  Shopping Buy sodium-free or low-sodium products. Look for the following words on food labels: Low-sodium. Sodium-free. Reduced-sodium. No salt added. Unsalted. Always check the sodium content even if foods are labeled as low-sodium or no salt added. Buy fresh foods. Cooking Use herbs, seasonings without salt, and spices as substitutes for salt. Use sodium-free baking  soda when baking. Grill, braise, or roast foods to add flavor with less salt. Avoid adding salt to pasta, rice, or hot cereals. Drain and rinse canned vegetables, beans, and meat before use. Avoid adding salt when cooking sweets and desserts. Cook with low-sodium ingredients. What foods are high in sodium? Vegetables Regular canned vegetables (not low-sodium or reduced-sodium). Sauerkraut, pickled vegetables, and relishes. Olives. [place] fries. Onion rings. Regular canned tomato sauce and paste. Regular tomato and vegetable juice. Frozenvegetables in sauces. Grains Instant hot cereals. Bread stuffing, pancake, and biscuit mixes. Croutons. Seasoned rice or pasta mixes. Noodle soup cups. Boxed or frozen macaroni and cheese. Regular salted crackers. Self-rising flour. Rolls. Bagels. Flourtortillas and wraps. Meats and other proteins Meat or  fish that is salted, canned, smoked, cured, spiced, or pickled. This includes bacon, ham, sausages, hot dogs, corned beef, chipped beef, meat loaves, salt pork, jerky, pickled herring, anchovies, regular canned tuna, andsardines. Salted nuts. Dairy Processed cheese and cheese spreads. Cheese curds. Blue cheese. Feta cheese.String cheese. Regular cottage cheese. Buttermilk. Canned milk. The items listed above may not be a complete list of foods high in sodium. Actual amounts of sodium may be different depending on processing. Contact a dietitian for more information. What foods are low in sodium? Fruits Fresh, frozen, or canned fruit with no sauce added. Fruit juice. Vegetables Fresh or frozen vegetables with no sauce added. "No salt added" canned vegetables. "No salt added" tomato sauce and paste. Low-sodium orreduced-sodium tomato and vegetable juice. Grains Noodles, pasta, quinoa, rice. Shredded or puffed wheat or puffed rice. Regular or quick oats (not instant). Low-sodium crackers. Low-sodium bread. Whole-grainbread and whole-grain pasta. Unsalted popcorn. Meats and other proteins Fresh or frozen whole meats, poultry (not injected with sodium), and fish with no sauce added. Unsalted nuts. Dried peas, beans, and lentils without added salt. Unsalted canned beans. Eggs. Unsalted nut butters. Low-sodium canned tunaor chicken. Dairy Milk. Soy milk. Yogurt. Low-sodium cheeses, such as Swiss, Monterey Jack, [place], and [employer]. Sherbet or ice cream (keep to  cup per serving).Cream cheese. Fats and oils Unsalted butter or margarine. Other foods Homemade pudding. Sodium-free baking soda and baking powder. Herbs and spices.Low-sodium seasoning mixes. Beverages Coffee and tea. Carbonated beverages. The items listed above may not be a complete list of foods low in sodium. Actual amounts of sodium may be different depending on processing. Contact a dietitian for more information. What are some salt  alternatives when cooking? The following are herbs, seasonings, and spices that can be used instead of salt to flavor your food. Herbs should be fresh or dried. Do not choose packaged mixes. Next to the name of the herb, spice, or seasoning aresome examples of foods you can pair it with. Herbs [place] leaves - Soups, meat and vegetable dishes, and spaghetti sauce. Basil - [employer], soups, pasta, and fish dishes. Cilantro - Meat, poultry, and vegetable dishes. Chili powder - Marinades and Mexican dishes. Chives - Salad dressings and potato dishes. Cumin - Mexican dishes, couscous, and meat dishes. Dill - Fish dishes, sauces, and salads. Fennel - Meat and vegetable dishes, breads, and cookies. Garlic (do not use garlic salt) - [place] dishes, meat dishes, salad dressings, and sauces. Marjoram - Soups, potato dishes, and meat dishes. Oregano - Pizza and spaghetti sauce. Parsley - Salads, soups, pasta, and meat dishes. Rosemary - [place] dishes, salad dressings, soups, and red meats. Saffron - Fish dishes, pasta, and some poultry dishes. Sage - Stuffings and sauces. Tarragon - Fish and [employer]. Thyme -  Stuffing, meat, and fish dishes. Seasonings Lemon juice - Fish dishes, poultry dishes, vegetables, and salads. Vinegar - Salad dressings, vegetables, and fish dishes. Spices Cinnamon - Sweet dishes, such as cakes, cookies, and puddings. Cloves - Gingerbread, puddings, and marinades for meats. Curry - Vegetable dishes, fish and poultry dishes, and stir-fry dishes. Ginger - Vegetable dishes, fish dishes, and stir-fry dishes. Nutmeg - Pasta, vegetables, poultry, fish dishes, and custard. Summary Cooking with less salt is one way to reduce the amount of sodium that you get from food. Buy sodium-free or low-sodium products. Check the food label before using or buying packaged ingredients. Use herbs, seasonings without salt, and spices as substitutes for salt in foods. This information  is not intended to replace advice given to you by your health care provider. Make sure you discuss any questions you have with your healthcare provider. Document Revised: 04/04/2019 Document Reviewed: 04/04/2019 Elsevier Patient Education  2022 [place].    Adopting a Healthy Lifestyle.  Know what a healthy weight is for you (roughly BMI <25) and aim to maintain this   Aim for 7+ servings of fruits and vegetables daily   65-80+ fluid ounces of water or unsweet tea for healthy kidneys   Limit to max 1 drink of alcohol per day; avoid smoking/tobacco   Limit animal fats in diet for cholesterol and heart health - choose grass fed whenever available   Avoid highly processed foods, and foods high in saturated/trans fats   Aim for low stress - take time to unwind and care for your mental health   Aim for 150 min of moderate intensity exercise weekly for heart health, and weights twice weekly for bone health   Aim for 7-9 hours of sleep daily   When it comes to diets, agreement about the perfect plan isnt easy to find, even among the experts. Experts at the [employer] developed an idea known as the Healthy Eating Plate. Just imagine a plate divided into logical, healthy portions.   The emphasis is on diet quality:   Load up on vegetables and fruits - one-half of your plate: Aim for color and variety, and remember that potatoes dont count.   Go for whole grains - one-quarter of your plate: Whole wheat, barley, wheat berries, quinoa, oats, [place] rice, and foods made with them. If you want pasta, go with whole wheat pasta.   Protein power - one-quarter of your plate: Fish, chicken, beans, and nuts are all healthy, versatile protein sources. Limit red meat.   The diet, however, does go beyond the plate, offering a few other suggestions.   Use healthy plant oils, such as olive, canola, soy, corn, sunflower and peanut. Check the labels, and avoid partially  hydrogenated oil, which have unhealthy trans fats.   If youre thirsty, drink water. Coffee and tea are good in moderation, but skip sugary drinks and limit milk and dairy products to one or two daily servings.   The type of carbohydrate in the diet is more important than the amount. Some sources of carbohydrates, such as vegetables, fruits, whole grains, and beans-are healthier than others.   Finally, stay active  Signed, Berniece Salines, DO  10/31/2020 4:37 PM    [facility]

## 2020-10-31 NOTE — Patient Instructions (Addendum)
Medication Instructions:  Your physician has recommended you make the following change in your medication:  START: Amlodipine 2.5 mg once daily  *If you need a refill on your cardiac medications before your next appointment, please call your pharmacy*   Lab Work: None If you have labs (blood work) drawn today and your tests are completely normal, you will receive your results only by: [place] (if you have MyChart) OR A paper copy in the mail If you have any lab test that is abnormal or we need to change your treatment, we will call you to review the results.   Testing/Procedures: None   Follow-Up: At [facility], you and your health needs are our priority.  As part of our continuing mission to provide you with exceptional heart care, we have created designated Provider Care Teams.  These Care Teams include your primary Cardiologist (physician) and Advanced Practice Providers (APPs -  Physician Assistants and Nurse Practitioners) who all work together to provide you with the care you need, when you need it.  We recommend signing up for the patient portal called "MyChart".  Sign up information is provided on this After Visit Summary.  MyChart is used to connect with patients for Virtual Visits (Telemedicine).  Patients are able to view lab/test results, encounter notes, upcoming appointments, etc.  Non-urgent messages can be sent to your provider as well.   To learn more about what you can do with MyChart, go to [email or web address].    Your next appointment:   6 month(s)  The format for your next appointment:   In Person  Provider:   [address] - Godfrey Pick Tobb, DO    Other Instructions  Please send your blood pressure readings in a MyChart message.    Mediterranean Diet A Mediterranean diet refers to food and lifestyle choices that are based on the traditions of countries located on the [employer]. This way of eating has been shown to help prevent  certain conditions and improve outcomes forpeople who have chronic diseases, like kidney disease and heart disease. What are tips for following this plan? Lifestyle Cook and eat meals together with your family, when possible. Drink enough fluid to keep your urine clear or pale yellow. Be physically active every day. This includes: Aerobic exercise like running or swimming. Leisure activities like gardening, walking, or housework. Get 7-8 hours of sleep each night. If recommended by your health care provider, drink red wine in moderation. This means 1 glass a day for nonpregnant women and 2 glasses a day for men. A glass of wine equals 5 oz (150 mL). Reading food labels  Check the serving size of packaged foods. For foods such as rice and pasta, the serving size refers to the amount of cooked product, not dry. Check the total fat in packaged foods. Avoid foods that have saturated fat or trans fats. Check the ingredients list for added sugars, such as corn syrup.  Shopping At the grocery store, buy most of your food from the areas near the walls of the store. This includes: Fresh fruits and vegetables (produce). Grains, beans, nuts, and seeds. Some of these may be available in unpackaged forms or large amounts (in bulk). Fresh seafood. Poultry and eggs. Low-fat dairy products. Buy whole ingredients instead of prepackaged foods. Buy fresh fruits and vegetables in-season from local farmers markets. Buy frozen fruits and vegetables in resealable bags. If you do not have access to quality fresh seafood, buy precooked frozen shrimp or canned fish, such  as tuna, salmon, or sardines. Buy small amounts of raw or cooked vegetables, salads, or olives from the deli or salad bar at your store. Stock your pantry so you always have certain foods on hand, such as olive oil, canned tuna, canned tomatoes, rice, pasta, and beans. Cooking Cook foods with extra-virgin olive oil instead of using butter or  other vegetable oils. Have meat as a side dish, and have vegetables or grains as your main dish. This means having meat in small portions or adding small amounts of meat to foods like pasta or stew. Use beans or vegetables instead of meat in common dishes like chili or lasagna. Experiment with different cooking methods. Try roasting or broiling vegetables instead of steaming or sauteing them. Add frozen vegetables to soups, stews, pasta, or rice. Add nuts or seeds for added healthy fat at each meal. You can add these to yogurt, salads, or vegetable dishes. Marinate fish or vegetables using olive oil, lemon juice, garlic, and fresh herbs. Meal planning  Plan to eat 1 vegetarian meal one day each week. Try to work up to 2 vegetarian meals, if possible. Eat seafood 2 or more times a week. Have healthy snacks readily available, such as: Vegetable sticks with hummus. Greek yogurt. Fruit and nut trail mix. Eat balanced meals throughout the week. This includes: Fruit: 2-3 servings a day Vegetables: 4-5 servings a day Low-fat dairy: 2 servings a day Fish, poultry, or lean meat: 1 serving a day Beans and legumes: 2 or more servings a week Nuts and seeds: 1-2 servings a day Whole grains: 6-8 servings a day Extra-virgin olive oil: 3-4 servings a day Limit red meat and sweets to only a few servings a month  What are my food choices? Mediterranean diet Recommended Grains: Whole-grain pasta. [place] rice. Bulgar wheat. Polenta. Couscous. Whole-wheat bread. [place]. Vegetables: Artichokes. Beets. Broccoli. Cabbage. Carrots. Eggplant. Green beans. Chard. Kale. Spinach. Onions. Leeks. Peas. Squash. Tomatoes. Peppers. Radishes. Fruits: Apples. Apricots. Avocado. Berries. Bananas. Cherries. Dates. Figs. Grapes. Lemons. Melon. Oranges. Peaches. Plums. Pomegranate. Meats and other protein foods: Beans. Almonds. Sunflower seeds. Pine nuts. Peanuts. Aquilla. Salmon. Scallops. Shrimp. [place]. Tilapia.  Clams. Oysters. Eggs. Dairy: Low-fat milk. Cheese. Greek yogurt. Beverages: Water. Red wine. Herbal tea. Fats and oils: Extra virgin olive oil. Avocado oil. Grape seed oil. Sweets and desserts: [place] yogurt with honey. Baked apples. Poached pears. Trail mix. Seasoning and other foods: Basil. Cilantro. Coriander. Cumin. Mint. Parsley. Sage. Rosemary. Tarragon. Garlic. Oregano. Thyme. Pepper. Balsalmic vinegar. Tahini. Hummus. Tomato sauce. Olives. Mushrooms. Limit these Grains: Prepackaged pasta or rice dishes. Prepackaged cereal with added sugar. Vegetables: Deep fried potatoes (french fries). Fruits: Fruit canned in syrup. Meats and other protein foods: Beef. Pork. Lamb. Poultry with skin. Hot dogs. Berniece Salines. Dairy: Ice cream. Sour cream. Whole milk. Beverages: Juice. Sugar-sweetened soft drinks. Beer. Liquor and spirits. Fats and oils: Butter. Canola oil. Vegetable oil. Beef fat (tallow). Lard. Sweets and desserts: Cookies. Cakes. Pies. Candy. Seasoning and other foods: Mayonnaise. Premade sauces and marinades. The items listed may not be a complete list. Talk with your dietitian aboutwhat dietary choices are right for you. Summary The Mediterranean diet includes both food and lifestyle choices. Eat a variety of fresh fruits and vegetables, beans, nuts, seeds, and whole grains. Limit the amount of red meat and sweets that you eat. Talk with your health care provider about whether it is safe for you to drink red wine in moderation. This means 1 glass a day for nonpregnant women  and 2 glasses a day for men. A glass of wine equals 5 oz (150 mL). This information is not intended to replace advice given to you by your health care provider. Make sure you discuss any questions you have with your healthcare provider. Document Revised: 12/11/2015 Document Reviewed: 12/04/2015 Elsevier Patient Education  2020 [place] Following a healthy eating pattern may help you to achieve  and maintain a healthy body weight, reduce the risk of chronic disease, and live a long and productive life. It is important to follow a healthy eating pattern at an appropriate calorie level for your body. Your nutritional needs should be metprimarily through food by choosing a variety of nutrient-rich foods. What are tips for following this plan? Reading food labels Read labels and choose the following: Reduced or low sodium. Juices with 100% fruit juice. Foods with low saturated fats and high polyunsaturated and monounsaturated fats. Foods with whole grains, such as whole wheat, cracked wheat, [place] rice, and wild rice. Whole grains that are fortified with folic acid. This is recommended for women who are pregnant or who want to become pregnant. Read labels and avoid the following: Foods with a lot of added sugars. These include foods that contain [place] sugar, corn sweetener, corn syrup, dextrose, fructose, glucose, high-fructose corn syrup, honey, invert sugar, lactose, malt syrup, maltose, molasses, raw sugar, sucrose, trehalose, or turbinado sugar. Do not eat more than the following amounts of added sugar per day: 6 teaspoons (25 g) for women. 9 teaspoons (38 g) for men. Foods that contain processed or refined starches and grains. Refined grain products, such as white flour, degermed cornmeal, white bread, and white rice. Shopping Choose nutrient-rich snacks, such as vegetables, whole fruits, and nuts. Avoid high-calorie and high-sugar snacks, such as potato chips, fruit snacks, and candy. Use oil-based dressings and spreads on foods instead of solid fats such as butter, stick margarine, or cream cheese. Limit pre-made sauces, mixes, and "instant" products such as flavored rice, instant noodles, and ready-made pasta. Try more plant-protein sources, such as tofu, tempeh, black beans, edamame, lentils, nuts, and seeds. Explore eating plans such as the Mediterranean diet or vegetarian  diet. Cooking Use oil to saut or stir-fry foods instead of solid fats such as butter, stick margarine, or lard. Try baking, boiling, grilling, or broiling instead of frying. Remove the fatty part of meats before cooking. Steam vegetables in water or broth. Meal planning  At meals, imagine dividing your plate into fourths: One-half of your plate is fruits and vegetables. One-fourth of your plate is whole grains. One-fourth of your plate is protein, especially lean meats, poultry, eggs, tofu, beans, or nuts. Include low-fat dairy as part of your daily diet.  Lifestyle Choose healthy options in all settings, including home, work, school, restaurants, or stores. Prepare your food safely: Wash your hands after handling raw meats. Keep food preparation surfaces clean by regularly washing with hot, soapy water. Keep raw meats separate from ready-to-eat foods, such as fruits and vegetables. Cook seafood, meat, poultry, and eggs to the recommended internal temperature. Store foods at safe temperatures. In general: Keep cold foods at 62F (4.4C) or below. Keep hot foods at 162F (60C) or above. Keep your freezer at [facility] (-17.8C) or below. Foods are no longer safe to eat when they have been between the temperatures of 40-162F (4.4-60C) for more than 2 hours. What foods should I eat? Fruits Aim to eat 2 cup-equivalents of fresh, canned (in natural juice), or frozen fruits  each day. Examples of 1 cup-equivalent of fruit include 1 small apple, 8large strawberries, 1 cup canned fruit,  cup dried fruit, or 1 cup 100% juice. Vegetables Aim to eat 2-3 cup-equivalents of fresh and frozen vegetables each day, including different varieties and colors. Examples of 1 cup-equivalent of vegetables include 2 medium carrots, 2 cups raw, leafy greens, 1 cup choppedvegetable (raw or cooked), or 1 medium baked potato. Grains Aim to eat 6 ounce-equivalents of whole grains each day. Examples of 1  ounce-equivalent of grains include 1 slice of bread, 1 cup ready-to-eat cereal,3 cups popcorn, or  cup cooked rice, pasta, or cereal. Meats and other proteins Aim to eat 5-6 ounce-equivalents of protein each day. Examples of 1 ounce-equivalent of protein include 1 egg, 1/2 cup nuts or seeds, or 1 tablespoon (16 g) peanut butter. A cut of meat or fish that is the size of a deck of cards is about 3-4 ounce-equivalents. Of the protein you eat each week, try to have at least 8 ounces come from seafood. This includes salmon, trout, herring, and anchovies. Dairy Aim to eat 3 cup-equivalents of fat-free or low-fat dairy each day. Examples of 1 cup-equivalent of dairy include 1 cup (240 mL) milk, 8 ounces (250 g) yogurt,1 ounces (44 g) natural cheese, or 1 cup (240 mL) fortified soy milk. Fats and oils Aim for about 5 teaspoons (21 g) per day. Choose monounsaturated fats, such as canola and olive oils, avocados, peanut butter, and most nuts, or polyunsaturated fats, such as sunflower, corn, and soybean oils, walnuts, pine nuts, sesame seeds, sunflower seeds, and flaxseed. Beverages Aim for six 8-oz glasses of water per day. Limit coffee to three to five 8-oz cups per day. Limit caffeinated beverages that have added calories, such as soda and energy drinks. Limit alcohol intake to no more than 1 drink a day for nonpregnant women and 2 drinks a day for men. One drink equals 12 oz of beer (355 mL), 5 oz of wine (148 mL), or 1 oz of hard liquor (44 mL). Seasoning and other foods Avoid adding excess amounts of salt to your foods. Try flavoring foods with herbs and spices instead of salt. Avoid adding sugar to foods. Try using oil-based dressings, sauces, and spreads instead of solid fats. This information is based on general [place] nutrition guidelines. For more information, visit BuildDNA.es. Exact amounts may vary based on your nutrition needs. Summary A healthy eating plan may help you to maintain a  healthy weight, reduce the risk of chronic diseases, and stay active throughout your life. Plan your meals. Make sure you eat the right portions of a variety of nutrient-rich foods. Try baking, boiling, grilling, or broiling instead of frying. Choose healthy options in all settings, including home, work, school, restaurants, or stores. This information is not intended to replace advice given to you by your health care provider. Make sure you discuss any questions you have with your healthcare provider. Document Revised: 07/25/2017 Document Reviewed: 07/25/2017 Elsevier Patient Education  2022 [place] Heart-healthy meal planning includes: Eating less unhealthy fats. Eating more healthy fats. Making other changes in your diet. Talk with your doctor or a diet specialist (dietitian) to create an eating plan that is right for you.  What are tips for following this plan? Cooking Avoid frying your food. Try to bake, boil, grill, or broil it instead. You can also reduce fat by: Removing the skin from poultry. Removing all visible fats from meats. Steaming  vegetables in water or broth. Meal planning  At meals, divide your plate into four equal parts: Fill one-half of your plate with vegetables and green salads. Fill one-fourth of your plate with whole grains. Fill one-fourth of your plate with lean protein foods. Eat 4-5 servings of vegetables per day. A serving of vegetables is: 1 cup of raw or cooked vegetables. 2 cups of raw leafy greens. Eat 4-5 servings of fruit per day. A serving of fruit is: 1 medium whole fruit.  cup of dried fruit.  cup of fresh, frozen, or canned fruit.  cup of 100% fruit juice. Eat more foods that have soluble fiber. These are apples, broccoli, carrots, beans, peas, and barley. Try to get 20-30 g of fiber per day. Eat 4-5 servings of nuts, legumes, and seeds per week: 1 serving of dried beans or legumes equals  cup after  being cooked. 1 serving of nuts is  cup. 1 serving of seeds equals 1 tablespoon.  General information Eat more home-cooked food. Eat less restaurant, buffet, and fast food. Limit or avoid alcohol. Limit foods that are high in starch and sugar. Avoid fried foods. Lose weight if you are overweight. Keep track of how much salt (sodium) you eat. This is important if you have high blood pressure. Ask your doctor to tell you more about this. Try to add vegetarian meals each week. Fats Choose healthy fats. These include olive oil and canola oil, flaxseeds, walnuts, almonds, and seeds. Eat more omega-3 fats. These include salmon, mackerel, sardines, tuna, flaxseed oil, and ground flaxseeds. Try to eat fish at least 2 times each week. Check food labels. Avoid foods with trans fats or high amounts of saturated fat. Limit saturated fats. These are often found in animal products, such as meats, butter, and cream. These are also found in plant foods, such as palm oil, palm kernel oil, and coconut oil. Avoid foods with partially hydrogenated oils in them. These have trans fats. Examples are stick margarine, some tub margarines, cookies, crackers, and other baked goods. What foods can I eat? Fruits All fresh, canned (in natural juice), or frozen fruits. Vegetables Fresh or frozen vegetables (raw, steamed, roasted, or grilled). Green salads. Grains Most grains. Choose whole wheat and whole grains most of the time. Rice andpasta, including [place] rice and pastas made with whole wheat. Meats and other proteins Lean, well-trimmed beef, veal, pork, and lamb. Chicken and [place] without skin. All fish and shellfish. Wild duck, rabbit, pheasant, and venison. Egg whites or low-cholesterol egg substitutes. Dried beans, peas, lentils, and tofu. Seedsand most nuts. Dairy Low-fat or nonfat cheeses, including ricotta and mozzarella. Skim or 1% milk that is liquid, powdered, or evaporated. Buttermilk that is made  with low-fatmilk. Nonfat or low-fat yogurt. Fats and oils Non-hydrogenated (trans-free) margarines. Vegetable oils, including soybean, sesame, sunflower, olive, peanut, safflower, corn, canola, and cottonseed. Salad dressings or mayonnaisemade with a vegetable oil. Beverages Mineral water. Coffee and tea. Diet carbonated beverages. Sweets and desserts Sherbet, gelatin, and fruit ice. Small amounts of dark chocolate. Limit all sweets and desserts. Seasonings and condiments All seasonings and condiments. The items listed above may not be a complete list of foods and drinks you can eat. Contact a dietitian for more options. What foods should I avoid? Fruits Canned fruit in heavy syrup. Fruit in cream or butter sauce. Fried fruit. Limitcoconut. Vegetables Vegetables cooked in cheese, cream, or butter sauce. Fried vegetables. Grains Breads that are made with saturated or trans fats, oils, or whole  milk. Croissants. Sweet rolls. Donuts. High-fat crackers,such as cheese crackers. Meats and other proteins Fatty meats, such as hot dogs, ribs, sausage, bacon, rib-eye roast or steak. High-fat deli meats, such as salami and bologna. Caviar. Domestic duck andgoose. Organ meats, such as liver. Dairy Cream, sour cream, cream cheese, and creamed cottage cheese. Whole-milk cheeses. Whole or 2% milk that is liquid, evaporated, or condensed. Whole buttermilk. Cream sauce or high-fat cheese sauce. Yogurt that is made fromwhole milk. Fats and oils Meat fat, or shortening. Cocoa butter, hydrogenated oils, palm oil, coconut oil, palm kernel oil. Solid fats and shortenings, including bacon fat, salt pork, lard, and butter. Nondairy cream substitutes. Salad dressings with cheeseor sour cream. Beverages Regular sodas and juice drinks with added sugar. Sweets and desserts Frosting. Pudding. Cookies. Cakes. Pies. Milk chocolate or white chocolate.Buttered syrups. Full-fat ice cream or ice cream drinks. The items  listed above may not be a complete list of foods and drinks to avoid. Contact a dietitian for more information. Summary Heart-healthy meal planning includes eating less unhealthy fats, eating more healthy fats, and making other changes in your diet. Eat a balanced diet. This includes fruits and vegetables, low-fat or nonfat dairy, lean protein, nuts and legumes, whole grains, and heart-healthy oils and fats. This information is not intended to replace advice given to you by your health care provider. Make sure you discuss any questions you have with your healthcare provider. Document Revised: 06/16/2017 Document Reviewed: 05/20/2017 Elsevier Patient Education  2022 [place] With Less [employer] Stores with less salt is one way to reduce the amount of sodium you get from food. Sodium is one of the elements that make up salt. It is found naturally in foods and is also added to certain foods. Depending on your condition and overall health, your health care provider or dietitian may recommend that you reduce your sodium intake. Most people should have less than 2,300 milligrams (mg) of sodium each day. If you have high blood pressure (hypertension), you may need to limit your sodium to 1,500 mg each day. Follow the tipsbelow to help reduce your sodium intake. What are tips for eating less sodium? Reading food labels  Check the food label before buying or using packaged ingredients. Always check the label for the serving size and sodium content. Look for products with no more than 140 mg of sodium in one serving. Check the % Daily Value column to see what percent of the daily recommended amount of sodium is provided in one serving of the product. Foods with 5% or less in this column are considered low in sodium. Foods with 20% or higher are considered high in sodium. Do not choose foods with salt as one of the first three ingredients on the ingredients list. If salt is one of the first three  ingredients, it usually means the item is high in sodium.  Shopping Buy sodium-free or low-sodium products. Look for the following words on food labels: Low-sodium. Sodium-free. Reduced-sodium. No salt added. Unsalted. Always check the sodium content even if foods are labeled as low-sodium or no salt added. Buy fresh foods. Cooking Use herbs, seasonings without salt, and spices as substitutes for salt. Use sodium-free baking soda when baking. Grill, braise, or roast foods to add flavor with less salt. Avoid adding salt to pasta, rice, or hot cereals. Drain and rinse canned vegetables, beans, and meat before use. Avoid adding salt when cooking sweets and desserts. Cook with low-sodium ingredients. What foods are high  in sodium? Vegetables Regular canned vegetables (not low-sodium or reduced-sodium). Sauerkraut, pickled vegetables, and relishes. Olives. [place] fries. Onion rings. Regular canned tomato sauce and paste. Regular tomato and vegetable juice. Frozenvegetables in sauces. Grains Instant hot cereals. Bread stuffing, pancake, and biscuit mixes. Croutons. Seasoned rice or pasta mixes. Noodle soup cups. Boxed or frozen macaroni and cheese. Regular salted crackers. Self-rising flour. Rolls. Bagels. Flourtortillas and wraps. Meats and other proteins Meat or fish that is salted, canned, smoked, cured, spiced, or pickled. This includes bacon, ham, sausages, hot dogs, corned beef, chipped beef, meat loaves, salt pork, jerky, pickled herring, anchovies, regular canned tuna, andsardines. Salted nuts. Dairy Processed cheese and cheese spreads. Cheese curds. Blue cheese. Feta cheese.String cheese. Regular cottage cheese. Buttermilk. Canned milk. The items listed above may not be a complete list of foods high in sodium. Actual amounts of sodium may be different depending on processing. Contact a dietitian for more information. What foods are low in sodium? Fruits Fresh, frozen, or canned  fruit with no sauce added. Fruit juice. Vegetables Fresh or frozen vegetables with no sauce added. "No salt added" canned vegetables. "No salt added" tomato sauce and paste. Low-sodium orreduced-sodium tomato and vegetable juice. Grains Noodles, pasta, quinoa, rice. Shredded or puffed wheat or puffed rice. Regular or quick oats (not instant). Low-sodium crackers. Low-sodium bread. Whole-grainbread and whole-grain pasta. Unsalted popcorn. Meats and other proteins Fresh or frozen whole meats, poultry (not injected with sodium), and fish with no sauce added. Unsalted nuts. Dried peas, beans, and lentils without added salt. Unsalted canned beans. Eggs. Unsalted nut butters. Low-sodium canned tunaor chicken. Dairy Milk. Soy milk. Yogurt. Low-sodium cheeses, such as Swiss, Monterey Jack, [place], and [employer]. Sherbet or ice cream (keep to  cup per serving).Cream cheese. Fats and oils Unsalted butter or margarine. Other foods Homemade pudding. Sodium-free baking soda and baking powder. Herbs and spices.Low-sodium seasoning mixes. Beverages Coffee and tea. Carbonated beverages. The items listed above may not be a complete list of foods low in sodium. Actual amounts of sodium may be different depending on processing. Contact a dietitian for more information. What are some salt alternatives when cooking? The following are herbs, seasonings, and spices that can be used instead of salt to flavor your food. Herbs should be fresh or dried. Do not choose packaged mixes. Next to the name of the herb, spice, or seasoning aresome examples of foods you can pair it with. Herbs [place] leaves - Soups, meat and vegetable dishes, and spaghetti sauce. Basil - [employer], soups, pasta, and fish dishes. Cilantro - Meat, poultry, and vegetable dishes. Chili powder - Marinades and Mexican dishes. Chives - Salad dressings and potato dishes. Cumin - Mexican dishes, couscous, and meat dishes. Dill - Fish dishes,  sauces, and salads. Fennel - Meat and vegetable dishes, breads, and cookies. Garlic (do not use garlic salt) - [place] dishes, meat dishes, salad dressings, and sauces. Marjoram - Soups, potato dishes, and meat dishes. Oregano - Pizza and spaghetti sauce. Parsley - Salads, soups, pasta, and meat dishes. Rosemary - [place] dishes, salad dressings, soups, and red meats. Saffron - Fish dishes, pasta, and some poultry dishes. Sage - Stuffings and sauces. Tarragon - Fish and [employer]. Thyme - Stuffing, meat, and fish dishes. Seasonings Lemon juice - Fish dishes, poultry dishes, vegetables, and salads. Vinegar - Salad dressings, vegetables, and fish dishes. Spices Cinnamon - Sweet dishes, such as cakes, cookies, and puddings. Cloves - Gingerbread, puddings, and marinades for meats. Curry - Vegetable dishes, fish and  poultry dishes, and stir-fry dishes. Ginger - Vegetable dishes, fish dishes, and stir-fry dishes. Nutmeg - Pasta, vegetables, poultry, fish dishes, and custard. Summary Cooking with less salt is one way to reduce the amount of sodium that you get from food. Buy sodium-free or low-sodium products. Check the food label before using or buying packaged ingredients. Use herbs, seasonings without salt, and spices as substitutes for salt in foods. This information is not intended to replace advice given to you by your health care provider. Make sure you discuss any questions you have with your healthcare provider. Document Revised: 04/04/2019 Document Reviewed: 04/04/2019 Elsevier Patient Education  [place].

## 2020-12-11 ENCOUNTER — Other Ambulatory Visit: Payer: Self-pay | Admitting: Internal Medicine

## 2020-12-18 ENCOUNTER — Encounter: Payer: Self-pay | Admitting: Internal Medicine

## 2021-01-07 ENCOUNTER — Other Ambulatory Visit: Payer: Self-pay | Admitting: Internal Medicine

## 2021-02-20 ENCOUNTER — Telehealth: Payer: Self-pay | Admitting: Internal Medicine

## 2021-02-20 MED ORDER — TRIAMTERENE-HCTZ 37.5-25 MG PO TABS: 1.0000 | ORAL_TABLET | Freq: Every day | ORAL | 0 refills | Status: DC

## 2021-02-20 NOTE — Telephone Encounter (Signed)
Will send 30 day supply, but last OV 05/2020- overdue for visit

## 2021-02-20 NOTE — Telephone Encounter (Signed)
Pt was requesting a  refill  Medication: triamterene-hydrochlorothiazide (MAXZIDE-25) 37.5-25 MG tablet   Has the patient contacted their pharmacy? No.   Preferred Pharmacy (with phone number or street name):  [id number] - [place], [address]  [place], [place] [address]  Phone:  [phone]  Fax:  [phone]

## 2021-03-03 ENCOUNTER — Other Ambulatory Visit: Payer: Self-pay

## 2021-03-03 ENCOUNTER — Ambulatory Visit: Payer: 59 | Admitting: Internal Medicine

## 2021-03-03 VITALS — BP 148/78 | HR 89 | Temp 98.1°F | Resp 12 | Ht 65.0 in | Wt 291.2 lb

## 2021-03-03 DIAGNOSIS — I10 Essential (primary) hypertension: Secondary | ICD-10-CM

## 2021-03-03 DIAGNOSIS — R739 Hyperglycemia, unspecified: Secondary | ICD-10-CM | POA: Diagnosis not present

## 2021-03-03 DIAGNOSIS — R079 Chest pain, unspecified: Secondary | ICD-10-CM | POA: Diagnosis not present

## 2021-03-03 DIAGNOSIS — D649 Anemia, unspecified: Secondary | ICD-10-CM

## 2021-03-03 NOTE — Progress Notes (Signed)
Subjective:    Patient ID: Felicia Monroe, female    DOB: [birth year], 50 y.o.   MRN: [id number]  DOS:  03/03/2021 Type of visit - description: Routine visit  Since the last visit is feeling well. Saw cardiology due to chest pain, chart reviewed. BP was somewhat elevated, was Rx amlodipine, ambulatory BPs are very good.   BP Readings from Last 3 Encounters:  03/03/21 (!) 148/78  10/31/20 136/90  08/19/20 (!) 159/95     Review of Systems Denies nausea, vomiting, diarrhea.  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    Family history of colon cancer - father in 50's 03/24/2013   Brother and sister also have polyps    Fluttering sensation of heart 07/03/2013   H/O chlamydia infection 04/07/93   H/O infertility 2004   H/O myomectomy 08/11/2011   For repeat cesarean section    H/O polydactyly    Heartburn in pregnancy    HTN (hypertension) 09/14/2011   Hx of hyperprolactinemia 08/2002   Hypertension    Irregular periods/menstrual cycles    Left shoulder pain 10/03/2017   Microadenoma 08/2010   Morbid obesity ([place]) 07/26/2011   Obesity    Personal history of colonic adenomas 04/21/2010   Previous cesarean delivery, antepartum condition or complication 5/78/4696   For repeat cesarean 09/10/11 AVS    Status post repeat low transverse cesarean section 09/11/2011   Uterine fibroid 10/1999    Past Surgical History:  Procedure Laterality Date   CESAREAN SECTION     x2   COLONOSCOPY     MYOMECTOMY  07/2003   TUBAL LIGATION     UTERINE FIBROID SURGERY  2005   myomectomy x 1    Allergies as of 03/03/2021   No Known Allergies      Medication List        Accurate as of March 03, 2021  4:17 PM. If you have any questions, ask your nurse or doctor.          STOP taking these medications    aspirin EC 81 MG tablet Stopped by: Kathlene November, MD   nitroGLYCERIN 0.4 MG SL tablet Commonly known as: NITROSTAT Stopped by: Kathlene November, MD       TAKE these medications    amLODipine 2.5 MG tablet Commonly known as: NORVASC Take 1 tablet (2.5 mg total) by mouth daily.   triamterene-hydrochlorothiazide 37.5-25 MG tablet Commonly known as: MAXZIDE-25 Take 1 tablet by mouth daily.           Objective:   Physical Exam BP (!) 148/78 (BP Location: Right Wrist, Cuff Size: Normal)   Pulse 89   Temp 98.1 F (36.7 C) (Oral)   Resp 12   Ht 5\' 5"  (1.651 m)   Wt 291 lb 3.2 oz (132.1 kg)   SpO2 98%   BMI 48.46 kg/m  General:   Well developed, NAD, BMI noted. HEENT:  Normocephalic . Face symmetric, atraumatic Lungs:  CTA B Normal respiratory effort, no intercostal retractions, no accessory muscle use. Heart: RRR,  no murmur.  Lower extremities: no pretibial edema bilaterally  Skin: Not pale.  Not jaundice Neurologic:  alert & oriented X3.  Speech normal, gait appropriate for age and unassisted Psych--  Cognition and judgment appear intact.  Cooperative with normal attention span and concentration.  Behavior appropriate. No anxious or depressed appearing.      Assessment    Assessment  Hyperglycemia:  A1C = 6.1 (march 2022) HTN Morbid Obesity  GYN: Dr Raphael Gibney  Infertility, irregular periods, uterine fibroids H/o abnormal Pap smear, chlamydia and condyloma, H/o hyperprolactinemia 2004 and microadenomas 2012 (dx per gyn?) BTL  Seasonal allergies +FH colon ca father age 43, + colon polyps CP >>> 07/2020 Coronary Ca+Score : zero ; echo wnl except for mild MR  PLAN Hyperglycemia:  A1C = 6.1 (march 2022), will repeat A1c, she is aware she has hyperglycemia, treatment is a healthy diet and exercise. CP: saw cards, 06-2020, Coronary Ca+Score : zero ; echo  wnl except for mild MR.  No further chest pain HTN: Amlodipine was added to diuretics by cardiology due to elevated BP; BP today is elevated, rec to increase amlodipine to 5 mg, patient declined,  states BP is okay at home and she will continue monitoring.  Encouraged to watch salt intake. Anemia: las Hg was getting better , ferritin ok, iron slt low ; next cscope 2024.  GI ROS negative.  Recheck labs on RTC Morbid obesity: Not making progress, option of going to the wellness clinic is discussed, states she is unable to pursue that because her work schedule Preventive care:  flu and covid vax d/w pt, has declined consistently  RTC 4 months CPX  This visit occurred during the SARS-CoV-2 public health emergency.  Safety protocols were in place, including screening questions prior to the visit, additional usage of staff PPE, and extensive cleaning of exam room while observing appropriate contact time as indicated for disinfecting solutions.

## 2021-03-03 NOTE — Patient Instructions (Signed)
Check the  blood pressure weekly, watch your salt intake BP GOAL is between 110/65 and  135/85. If it is consistently higher or lower, let me know     GO TO THE LAB : Get the blood work     [place], [place] back for   a physical exam in 4 months

## 2021-03-04 LAB — HEMOGLOBIN A1C: Hgb A1c MFr Bld: 6.8 % — ABNORMAL HIGH (ref 4.6–6.5)

## 2021-03-04 NOTE — Assessment & Plan Note (Signed)
Hyperglycemia:  A1C = 6.1 (march 2022), will repeat A1c, she is aware she has hyperglycemia, treatment is a healthy diet and exercise. CP: saw cards, 06-2020, Coronary Ca+Score : zero ; echo wnl except for mild MR.  No further chest pain HTN: Amlodipine was added to diuretics by cardiology due to elevated BP; BP today is elevated, rec to increase amlodipine to 5 mg, patient declined,  states BP is okay at home and she will continue monitoring.  Encouraged to watch salt intake. Anemia: las Hg was getting better , ferritin ok, iron slt low ; next cscope 2024.  GI ROS negative.  Recheck labs on RTC Morbid obesity: Not making progress, option of going to the wellness clinic is discussed, states she is unable to pursue that because her work schedule Preventive care:  flu and covid vax d/w pt, has declined consistently  RTC 4 months CPX

## 2021-03-18 ENCOUNTER — Other Ambulatory Visit: Payer: Self-pay | Admitting: Internal Medicine

## 2021-05-24 ENCOUNTER — Telehealth: Payer: Self-pay | Admitting: Internal Medicine

## 2021-05-24 NOTE — Telephone Encounter (Signed)
Called by [facility] nurse Patient feels bad---thirsty, polyuria, etc Checking sugars and now over 300  Advised to drink lots of fluids and stay away from all sweets and carbs for now Will need treatment but not sure what to start on a Sunday night  If sugar keeps going up (over 400) or she feels bad---she will need to go to the ER. Should call Dr Larose Kells first thing in the morning

## 2021-05-25 ENCOUNTER — Ambulatory Visit: Payer: 59 | Admitting: Family Medicine

## 2021-05-25 VITALS — BP 134/84 | HR 82 | Temp 97.8°F | Resp 18

## 2021-05-25 DIAGNOSIS — E119 Type 2 diabetes mellitus without complications: Secondary | ICD-10-CM

## 2021-05-25 LAB — HEMOGLOBIN A1C: Hgb A1c MFr Bld: 9.5 % — ABNORMAL HIGH (ref 4.6–6.5)

## 2021-05-25 LAB — GLUCOSE, POCT (MANUAL RESULT ENTRY): POC Glucose: 402 mg/dl — AB (ref 70–99)

## 2021-05-25 MED ORDER — METFORMIN HCL ER 500 MG PO TB24: 500.0000 mg | ORAL_TABLET | Freq: Every day | ORAL | 3 refills | Status: DC

## 2021-05-25 NOTE — Telephone Encounter (Signed)
Nurse Assessment Nurse: [name], RN, [name] Date/Time Felicia Monroe Time): 05/24/2021 4:49:00 PM Confirm and document reason for call. If symptomatic, describe symptoms. ---caller states she has frequent urination, she is extra thirsty, she has blurry vision, she has lost weight and her BS is 346. she states she did not know she was diabetic but she just saw it on her mychart that she is or possibly is diabetic. she is not being treated for it. no fever. Does the patient have any new or worsening symptoms? ---Yes Will a triage be completed? ---Yes Related visit to physician within the last 2 weeks? ---No Does the PT have any chronic conditions? (i.e. diabetes, asthma, this includes High risk factors for pregnancy, etc.) ---Yes List chronic conditions. ---pre diabetic or possibly diabetic, HTN Is the patient pregnant or possibly pregnant? (Ask all females between the ages of 71-55) ---No Is this a behavioral health or substance abuse call? ---No Guidelines Guideline Title Affirmed Question Affirmed Notes Nurse Date/Time (Eastern Time) Diabetes - High Blood Sugar [1] Blood glucose > 300 mg/dL (16.7 mmol/L) AND [2] two or more times in a row [name], RN, [name] 05/24/2021 4:51:03 PM PLEASE NOTE: All timestamps contained within this report are represented as [place] Standard Time. CONFIDENTIALTY NOTICE: This fax transmission is intended only for the addressee. It contains information that is legally privileged, confidential or otherwise protected from use or disclosure. If you are not the intended recipient, you are strictly prohibited from reviewing, disclosing, copying using or disseminating any of this information or taking any action in reliance on or regarding this information. If you have received this fax in error, please notify us immediately by telephone so that we can arrange for its return to [place]. Phone: [phone], Toll-Free: [phone], Fax: [phone] Page: 2 of  2 Call Id: [id number] [place]. Time Felicia Monroe Time) Disposition Final User 05/24/2021 4:53:47 PM Paged On Call back to [facility], [place] 05/24/2021 4:54:43 PM Paged On Call back to [place], RN, [name] 05/24/2021 5:07:15 PM Paged On Call back to [facility], [place] 05/24/2021 5:25:22 PM Paged On Call back to [facility], [place], [name] 05/24/2021 4:54:07 PM Call PCP Now Yes [name], RN, [name] Disagree/Comply Comply Caller Understands Yes PreDisposition Call Doctor Care Advice Given Per Guideline CALL PCP NOW: * You need to discuss this with your doctor (or NP/PA). * I'll page the on-call provider now. If you haven't heard from the provider (or me) within 30 minutes, call again. CALL BACK IF: * Vomiting occurs * Rapid breathing occurs * You become worse CARE ADVICE given per Diabetes - High Blood Sugar (Adult) guideline. Paging DoctorName Phone DateTime Result/ Outcome Message Type Notes [name]- MD [phone] 05/24/2021 4:53:47 PM Paged On Call Back to Call Center Doctor Paged please call me [phone]. kim, RN [name]- MD [phone] 05/24/2021 5:07:15 PM Called On Call Provider - Left Message Doctor Paged [name]- MD [phone] 05/24/2021 5:25:22 PM Paged On Call Back to Call Center Doctor Paged sorry i missed your call. your cell phone goes straight to VM, i left you a voicemail as well. please call me at [phone]. thank you, kim RN [name]- MD 05/24/2021 5:32:59 PM Spoke with On Call - General Message Result per on call dr states to continue monitoring BS, stay away from carbs and sugar, and drink lots of fluids. if BS goes up to 400 or she feels bad to go to ED

## 2021-05-25 NOTE — Progress Notes (Addendum)
[place] [facility] [address] [place], [place] [address] [phone]  Date:  05/25/2021   Name:  Felicia Monroe   DOB:  1970/11/27   MRN:  [id number]  PCP:  Colon Branch, MD    Chief Complaint: Hyperglycemia (high blood sugar, spoke with triage 1/29. This morning was 351. She says she was only told she had DM via MyChart. She has had fatigue, vision changes, excessive thirst and urination. )   History of Present Illness:  Felicia Monroe is a 51 y.o. very pleasant female patient who presents with the following:  Pt seen today with concern of elevated blood sugars Her most recent visit with PCP, Dr. Larose Kells was in November.  At that time her A1c had been a bit elevated-6.1 in March 2022 A1c came back higher as below in November- however pt reports she had not been aware of this until she checked her mychart 3 days ago  Not yet taking any medication to lower blood sugars  She has noted blurry vision B, she has started checking her blood sugars over the last few days Also has noted polydipsia and polyuria She got a blood sugar meter and just started checking her glucose  Her glucose this am was 351 DM does run in her family Her sister is on insulin but her mom does not require insulin   Lab Results  Component Value Date   HGBA1C 6.8 (H) 03/03/2021     Patient Active Problem List   Diagnosis Date Noted   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[place]) 10/31/2020   Coronary vasospasm ([place]) 10/31/2020   Obesity    Hypertension    Heartburn in pregnancy    H/O polydactyly    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    AMA (advanced maternal age) multigravida 35+    Allergy    Left shoulder pain 10/03/2017   PCP NOTES >>> 02/17/2015   Fluttering sensation of heart 07/03/2013   Acid reflux 07/03/2013   Family history of colon cancer - father in 22's 03/24/2013   Annual physical exam 10/04/2012   HTN (hypertension) 09/14/2011    Status post repeat low transverse cesarean section 09/11/2011   Anemia 09/11/2011   H/O myomectomy 08/11/2011   Previous cesarean delivery, antepartum condition or complication 11/94/1740   Morbid obesity ([place]) 07/26/2011   Microadenoma 08/2010   Back pain 07/27/2010   Personal history of colonic adenomas 04/21/2010   Hx of hyperprolactinemia 08/2002   H/O infertility 2004   Uterine fibroid 10/1999   H/O chlamydia infection 04/07/1993    Past Medical History:  Diagnosis Date   Abnormal Pap smear 1995   Acid reflux 07/03/2013   Allergy    seasonal   AMA (advanced maternal age) multigravida 35+    Anemia    Annual physical exam 10/04/2012   Back pain 07/27/2010   Blood transfusion 2006   [place]   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    Family history of colon cancer - father in 13's 03/24/2013   Brother and sister also have polyps    Fluttering sensation of heart 07/03/2013   H/O chlamydia infection 04/07/93   H/O infertility 2004   H/O myomectomy 08/11/2011   For repeat cesarean section    H/O polydactyly    Heartburn in pregnancy    HTN (hypertension) 09/14/2011   Hx of hyperprolactinemia 08/2002   Hypertension    Irregular periods/menstrual cycles    Left  shoulder pain 10/03/2017   Microadenoma 08/2010   Morbid obesity ([place]) 07/26/2011   Obesity    Personal history of colonic adenomas 04/21/2010   Previous cesarean delivery, antepartum condition or complication 0/93/8182   For repeat cesarean 09/10/11 AVS    Status post repeat low transverse cesarean section 09/11/2011   Uterine fibroid 10/1999    Past Surgical History:  Procedure Laterality Date   CESAREAN SECTION     x2   COLONOSCOPY     MYOMECTOMY  07/2003   TUBAL LIGATION     UTERINE FIBROID SURGERY  2005   myomectomy x 1    Social History   Tobacco Use   Smoking status: Never   Smokeless tobacco: Never  Vaping Use   Vaping Use: Never used  Substance Use Topics   Alcohol use: Yes    Comment: socially   Drug use: No     Family History  Problem Relation Age of Onset   Colon cancer Father 29           Hypertension Father    Colon polyps Brother        x 2   Hypertension Mother    Diabetes Mother    Colon polyps Sister    Coronary artery disease Other        GM, aunt   Diabetes Sister    Heart disease Maternal Grandmother    Diabetes Maternal Grandmother    Breast cancer Neg Hx    Esophageal cancer Neg Hx    Rectal cancer Neg Hx    Stomach cancer Neg Hx     No Known Allergies  Medication list has been reviewed and updated.  Current Outpatient Medications on File Prior to Visit  Medication Sig Dispense Refill   amLODipine (NORVASC) 2.5 MG tablet Take 1 tablet (2.5 mg total) by mouth daily. 90 tablet 3   triamterene-hydrochlorothiazide (MAXZIDE-25) 37.5-25 MG tablet TAKE 1 TABLET BY MOUTH DAILY 90 tablet 1   No current facility-administered medications on file prior to visit.    Review of Systems:  As per HPI- otherwise negative.   Physical Examination: Vitals:   05/25/21 1032  BP: 134/84  Pulse: 82  Resp: 18  Temp: 97.8 F (36.6 C)  SpO2: 98%   There were no vitals filed for this visit. There is no height or weight on file to calculate BMI. Ideal Body Weight:    GEN: no acute distress. Obese, looks well  HEENT: Atraumatic, Normocephalic.  Ears and Nose: No external deformity. CV: RRR, No M/G/R. No JVD. No thrill. No extra heart sounds. PULM: CTA B, no wheezes, crackles, rhonchi. No retractions. No resp. distress. No accessory muscle use.  EXTR: No c/c/e PSYCH: Normally interactive. Conversant.   Results for orders placed or performed in visit on 05/25/21  POCT glucose (manual entry)  Result Value Ref Range   POC Glucose 402 (A) 70 - 99 mg/dl    Assessment and Plan: Diabetes mellitus without complication (HCC) - Plan: Comprehensive metabolic panel, Hemoglobin A1c, metFORMIN (GLUCOPHAGE XR) 500 MG 24 hr tablet, POCT glucose (manual entry)  Pt seen today with  concern of worsening blood sugar control She has history of pre-diabetes, then her last A1c was 6.8 in November.  Suspect her glucose control has gotten dramatically worse over the last few weeks Offered to start her on a short term insulin- pt would rather not if possible Will have her start on metformin 500 mg for a week, then go to 1000 mg  assuming well tolerated Check glucose once a day for now Work on diet and exercise Will plan further follow- up pending labs. She is asked to see me in about 6 weeks assuming all is going ok   Signed Lamar Blinks, MD  1/31, received her labs as below.  Message to patient  Results for orders placed or performed in visit on 05/25/21  Comprehensive metabolic panel  Result Value Ref Range   Sodium 134 (L) 135 - 145 mEq/L   Potassium 3.9 3.5 - 5.1 mEq/L   Chloride 94 (L) 96 - 112 mEq/L   CO2 27 19 - 32 mEq/L   Glucose, Bld 341 (H) 70 - 99 mg/dL   BUN 16 6 - 23 mg/dL   Creatinine, Ser 0.79 0.40 - 1.20 mg/dL   Total Bilirubin 0.4 0.2 - 1.2 mg/dL   Alkaline Phosphatase 99 39 - 117 U/L   AST 14 0 - 37 U/L   ALT 14 0 - 35 U/L   Total Protein 7.6 6.0 - 8.3 g/dL   Albumin 4.2 3.5 - 5.2 g/dL   GFR 85.93 >60.00 mL/min   Calcium 9.7 8.4 - 10.5 mg/dL  Hemoglobin A1c  Result Value Ref Range   Hgb A1c MFr Bld 9.5 (H) 4.6 - 6.5 %  POCT glucose (manual entry)  Result Value Ref Range   POC Glucose 402 (A) 70 - 99 mg/dl

## 2021-05-25 NOTE — Telephone Encounter (Signed)
To be seen today

## 2021-05-25 NOTE — Patient Instructions (Signed)
It was good to see you today-I am sorry you are not feeling very well We will be in touch with your A1c and other labs as soon as possible  I am hoping your blood sugars will come under control fairly easily-please start taking metformin 500 mg 1 daily.  The most common side effect is loose stools, typically this improves with time  After about a week, try increasing metformin to 2 pills daily- 1000 milligrams total Work on decreasing your intake of carbohydrates and sugars-increase vegetables, lean protein and low-fat dairy Exercise like walking will also help bring your sugars down Would recommend avoiding full sugar soda or sweet tea; diet, tea with splenda is ok!    Please let me know how your sugars are doing over the next couple of weeks

## 2021-05-26 ENCOUNTER — Encounter: Payer: Self-pay | Admitting: Family Medicine

## 2021-05-26 LAB — COMPREHENSIVE METABOLIC PANEL
ALT: 14 U/L (ref 0–35)
AST: 14 U/L (ref 0–37)
Albumin: 4.2 g/dL (ref 3.5–5.2)
Alkaline Phosphatase: 99 U/L (ref 39–117)
BUN: 16 mg/dL (ref 6–23)
CO2: 27 mEq/L (ref 19–32)
Calcium: 9.7 mg/dL (ref 8.4–10.5)
Chloride: 94 mEq/L — ABNORMAL LOW (ref 96–112)
Creatinine, Ser: 0.79 mg/dL (ref 0.40–1.20)
GFR: 85.93 mL/min (ref >60.00)
Glucose, Bld: 341 mg/dL — ABNORMAL HIGH (ref 70–99)
Potassium: 3.9 mEq/L (ref 3.5–5.1)
Sodium: 134 mEq/L — ABNORMAL LOW (ref 135–145)
Total Bilirubin: 0.4 mg/dL (ref 0.2–1.2)
Total Protein: 7.6 g/dL (ref 6.0–8.3)

## 2021-06-17 MED ORDER — LANCETS 30G MISC: 1.0000 | Freq: Two times a day (BID) | 99 refills | Status: AC

## 2021-06-17 MED ORDER — GLUCOSE BLOOD VI STRP: ORAL_STRIP | 12 refills | Status: AC

## 2021-06-17 NOTE — Addendum Note (Signed)
Addended by: Darreld Mclean on: 06/17/2021 06:23 PM   Modules accepted: Orders

## 2021-06-24 ENCOUNTER — Encounter: Payer: Self-pay | Admitting: Internal Medicine

## 2021-07-01 ENCOUNTER — Ambulatory Visit: Payer: 59 | Admitting: Internal Medicine

## 2021-07-01 ENCOUNTER — Encounter: Payer: Self-pay | Admitting: Internal Medicine

## 2021-07-01 VITALS — BP 132/84 | HR 80 | Temp 97.8°F | Resp 18 | Ht 65.0 in | Wt 276.1 lb

## 2021-07-01 DIAGNOSIS — E119 Type 2 diabetes mellitus without complications: Secondary | ICD-10-CM

## 2021-07-01 MED ORDER — EMPAGLIFLOZIN 10 MG PO TABS: 10.0000 mg | ORAL_TABLET | Freq: Every day | ORAL | 3 refills | Status: AC

## 2021-07-01 NOTE — Progress Notes (Signed)
? ?Subjective:  ? ? Patient ID: Felicia Monroe, female    DOB: 23-Oct-1970, 51 y.o.   MRN: [id number] ? ?DOS:  07/01/2021 ?Type of visit - description: f/u ? ?Was seen by Dr. Wylene Simmer 05/25/2021 due to hyperglycemia. ?Prior to the visit blood sugar was in the 300s. ?CMP was done: Blood sugar 341, potassium and kidney function normal. ?A1c 4 months ago was 6.8, most recent A1c 9.5. ?Started metformin. ?She also changed her diet significantly. ?Has lost some weight. ?Ambulatory CBGs are now ~ 117-130 ? ?Thinks she is having side effects from metformin (even w/ 1 tablet daily): ?Dry mouth, loose stools daily, denies nausea-vomiting-blood in the stools. ? ?Has not have any polyuria or polydipsia.  No visual disturbances ? ?Wt Readings from Last 3 Encounters:  ?07/01/21 276 lb 2 oz (125.2 kg)  ?03/03/21 291 lb 3.2 oz (132.1 kg)  ?10/31/20 291 lb 1.6 oz (132 kg)  ? ?  ?Review of Systems ?See above  ? ?Past Medical History:  ?Diagnosis Date  ? Abnormal Pap smear 1995  ? Acid reflux 07/03/2013  ? Allergy   ? seasonal  ? AMA (advanced maternal age) multigravida 1+   ? Anemia   ? Annual physical exam 10/04/2012  ? Back pain 07/27/2010  ? Blood transfusion 2006  ? [place]  ? 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 Family history of colon cancer - father in 58's 03/24/2013  ? Brother and sister also have polyps   ? Fluttering sensation of heart 07/03/2013  ? H/O chlamydia infection 04/07/93  ? H/O infertility 2004  ? H/O myomectomy 08/11/2011  ? For repeat cesarean section   ? H/O polydactyly   ? Heartburn in pregnancy   ? HTN (hypertension) 09/14/2011  ? Hx of hyperprolactinemia 08/2002  ? Hypertension   ? Irregular periods/menstrual cycles   ? Left shoulder pain 10/03/2017  ? Microadenoma 08/2010  ? Morbid obesity ([place]) 07/26/2011  ? Obesity   ? Personal history of colonic adenomas 04/21/2010  ? Previous cesarean delivery, antepartum condition or complication 6/55/3748  ? For repeat cesarean 09/10/11 AVS   ? Status post repeat low transverse cesarean  section 09/11/2011  ? Uterine fibroid 10/1999  ? ? ?Past Surgical History:  ?Procedure Laterality Date  ? CESAREAN SECTION    ? x2  ? COLONOSCOPY    ? MYOMECTOMY  07/2003  ? TUBAL LIGATION    ? UTERINE FIBROID SURGERY  2005  ? myomectomy x 1  ? ? ?Current Outpatient Medications  ?Medication Instructions  ? amLODipine (NORVASC) 2.5 mg, Oral, Daily  ? empagliflozin (JARDIANCE) 10 mg, Oral, Daily before breakfast  ? glucose blood test strip Use as instructed to check blood sugar up to BID  ? Lancets 30G MISC 1 each, Does not apply, 2 times daily  ? triamterene-hydrochlorothiazide (MAXZIDE-25) 37.5-25 MG tablet 1 tablet, Oral, Daily  ? ? ?   ?Objective:  ? Physical Exam ?BP 132/84 (BP Location: Left Arm, Patient Position: Sitting, Cuff Size: Normal)   Pulse 80   Temp 97.8 ?F (36.6 ?C) (Oral)   Resp 18   Ht '5\' 5"'$  (1.651 m)   Wt 276 lb 2 oz (125.2 kg)   SpO2 98%   BMI 45.95 kg/m?  ?General:   ?Well developed, NAD, BMI noted. ?HEENT:  ?Normocephalic . Face symmetric, atraumatic ?Lungs:  ?CTA B ?Normal respiratory effort, no intercostal retractions, no accessory muscle use. ?Heart: RRR,  no murmur.  ?Lower extremities: no pretibial edema bilaterally  ?Skin: Not  pale. Not jaundice ?Neurologic:  ?alert & oriented X3.  ?Speech normal, gait appropriate for age and unassisted ?Psych--  ?Cognition and judgment appear intact.  ?Cooperative with normal attention span and concentration.  ?Behavior appropriate. ?No anxious or depressed appearing.  ? ?   ?Assessment   ? ? Assessment  ?Hyperglycemia:  A1C = 6.1 (march 2022) ?HTN ?Morbid Obesity  ?GYN: Dr Raphael Gibney  ?Infertility, irregular periods, uterine fibroids ?H/o abnormal Pap smear, chlamydia and condyloma, ?H/o hyperprolactinemia 2004 and microadenomas 2012 (dx per gyn?) ?BTL  ?Seasonal allergies ?+FH colon ca father age 94, + colon polyps ?CP >>> 07/2020 Coronary Ca+Score : zero ; echo wnl except for mild MR ? ?PLAN ?Diabetes: ?A1c few months ago was 6.8, she noted her blood  sugars to be in the 300s, was seen here, A1c increased to 9.5. ?She was Rx metformin but  developed side effects. ?Has changed her diet significantly and is knowledgeable of things like glycemic index  >> praised . ?We talk about stop metformin and then rechallenge, she is not in favor of that. ?We talk about other options, will try Jardiance 10 mg and continue with her better lifestyle. ?Refer to a nutritionist ?RTC CPX 2 months. ? ?  ?This visit occurred during the SARS-CoV-2 public health emergency.  Safety protocols were in place, including screening questions prior to the visit, additional usage of staff PPE, and extensive cleaning of exam room while observing appropriate contact time as indicated for disinfecting solutions.  ? ?

## 2021-07-01 NOTE — Patient Instructions (Signed)
Stop metformin ? ?Start Jardiance 1 tablet in the morning ? ?Watch for vaginal just infections.  If they are severe let me know ? ? ?Check your blood sugar at different times  ?- early in AM fasting  ( goal 70-130) ?- 2 hours after a meal (goal less than 180) ?- bedtime (goal 90-150) ? ?Continue eating healthy ? ?We will refer you to a nutritionist ? ?Check the  blood pressure regularly ?BP GOAL is between 110/65 and  135/85. ?If it is consistently higher or lower, let me know ? ? ?GO TO THE FRONT DESK, PLEASE SCHEDULE YOUR APPOINTMENTS ?Come back for a physical exam in 2 months ?

## 2021-07-02 NOTE — Assessment & Plan Note (Signed)
Diabetes: ?A1c few months ago was 6.8, she noted her blood sugars to be in the 300s, was seen here, A1c increased to 9.5. ?She was Rx metformin but  developed side effects. ?Has changed her diet significantly and is knowledgeable of things like glycemic index  >> praised . ?We talk about stop metformin and then rechallenge, she is not in favor of that. ?We talk about other options, will try Jardiance 10 mg and continue with her better lifestyle. ?Refer to a nutritionist ?RTC CPX 2 months. ?

## 2021-08-28 ENCOUNTER — Ambulatory Visit: Payer: 59 | Admitting: Cardiology

## 2021-08-28 ENCOUNTER — Encounter: Payer: Self-pay | Admitting: Cardiology

## 2021-08-28 VITALS — BP 138/86 | HR 68 | Ht 65.0 in | Wt 262.2 lb

## 2021-08-28 DIAGNOSIS — R011 Cardiac murmur, unspecified: Secondary | ICD-10-CM | POA: Insufficient documentation

## 2021-08-28 DIAGNOSIS — E119 Type 2 diabetes mellitus without complications: Secondary | ICD-10-CM | POA: Diagnosis not present

## 2021-08-28 DIAGNOSIS — I201 Angina pectoris with documented spasm: Secondary | ICD-10-CM

## 2021-08-28 DIAGNOSIS — I10 Essential (primary) hypertension: Secondary | ICD-10-CM | POA: Diagnosis not present

## 2021-08-28 NOTE — Progress Notes (Signed)
?Cardiology Office Note:   ? ?Date:  08/28/2021  ? ?ID:  Felicia Monroe, DOB 04-23-1971, MRN [id number] ? ?PCP:  Colon Branch, MD  ?Cardiologist:  Berniece Salines, DO  ?Electrophysiologist:  None  ? ?Referring MD: Colon Branch, MD  ? ?' I am ok" ? ? ?History of Present Illness:   ? ?Felicia Monroe is a 51 y.o. female with a hx of type 2 diabetes mellitus, morbid obesity, hypertension here for follow-up visit. ? ? ?Her last visit was October 31, 2020 at that time she did have some dry systolic hypertension and I had suspected some coronary vasospasm so I started the patient on low-dose amlodipine 2.5 mg daily.  We will continue her other medication. ? ?Since her last visit the chest pain has improved significantly but she has been diagnosed with type 2 diabetes.  She has been started on oral medications.  She did not tolerate the metformin this has been changed she is currently on Jardiance. ? ? ?Past Medical History:  ?Diagnosis Date  ? Abnormal Pap smear 1995  ? Acid reflux 07/03/2013  ? Allergy   ? seasonal  ? AMA (advanced maternal age) multigravida 72+   ? Anemia   ? Annual physical exam 10/04/2012  ? Back pain 07/27/2010  ? Blood transfusion 2006  ? [place]  ? 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 Family history of colon cancer - father in 49's 03/24/2013  ? Brother and sister also have polyps   ? Fluttering sensation of heart 07/03/2013  ? H/O chlamydia infection 04/07/93  ? H/O infertility 2004  ? H/O myomectomy 08/11/2011  ? For repeat cesarean section   ? H/O polydactyly   ? Heartburn in pregnancy   ? HTN (hypertension) 09/14/2011  ? Hx of hyperprolactinemia 08/2002  ? Hypertension   ? Irregular periods/menstrual cycles   ? Left shoulder pain 10/03/2017  ? Microadenoma 08/2010  ? Morbid obesity ([name]) 07/26/2011  ? Obesity   ? Personal history of colonic adenomas 04/21/2010  ? Previous cesarean delivery, antepartum condition or complication 06/15/2540  ? For repeat cesarean 09/10/11 AVS   ? Status post repeat low transverse cesarean  section 09/11/2011  ? Uterine fibroid 10/1999  ? ? ?Past Surgical History:  ?Procedure Laterality Date  ? CESAREAN SECTION    ? x2  ? COLONOSCOPY    ? MYOMECTOMY  07/2003  ? TUBAL LIGATION    ? UTERINE FIBROID SURGERY  2005  ? myomectomy x 1  ? ? ?Current Medications: ?Current Meds  ?Medication Sig  ? amLODipine (NORVASC) 2.5 MG tablet Take 1 tablet (2.5 mg total) by mouth daily.  ? empagliflozin (JARDIANCE) 10 MG TABS tablet Take 1 tablet (10 mg total) by mouth daily before breakfast.  ? glucose blood test strip Use as instructed to check blood sugar up to BID  ? Lancets 30G MISC 1 each by Does not apply route 2 (two) times daily.  ? triamterene-hydrochlorothiazide (MAXZIDE-25) 37.5-25 MG tablet TAKE 1 TABLET BY MOUTH DAILY  ?  ? ?Allergies:   Patient has no known allergies.  ? ?Social History  ? ?Socioeconomic History  ? Marital status: Married  ?  Spouse name: Not on file  ? Number of children: 2  ? Years of education: Not on file  ? Highest education level: Not on file  ?Occupational History  ? 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 Smoking status: Never  ? Smokeless tobacco: Never  ?Vaping Use  ?  Vaping Use: Never used  ?Substance and Sexual Activity  ? Alcohol use: Yes  ?  Comment: socially  ? Drug use: No  ? Sexual activity: Not Currently  ?  Birth control/protection: Surgical  ?  Comment: BTL  ?Other Topics Concern  ? Not on file  ?Social History Narrative  ? household- pt, husband, children  ? Two children  2006, 2013  ? ?Social Determinants of Health  ? ?Financial Resource Strain: Not on file  ?Food Insecurity: Not on file  ?Transportation Needs: Not on file  ?Physical Activity: Not on file  ?Stress: Not on file  ?Social Connections: Not on file  ?  ? ?Family History: ?The patient's family history includes Colon cancer (age of onset: 55) in her father; Colon polyps in her brother and sister; Coronary artery disease in an other family member; Diabetes in her maternal grandmother, mother, and  sister; Heart disease in her maternal grandmother; Hypertension in her father and mother. There is no history of Breast cancer, Esophageal cancer, Rectal cancer, or Stomach cancer. ? ?ROS:   ?Review of Systems  ?Constitution: Negative for decreased appetite, fever and weight gain.  ?HENT: Negative for congestion, ear discharge, hoarse voice and sore throat.   ?Eyes: Negative for discharge, redness, vision loss in right eye and visual halos.  ?Cardiovascular: Negative for chest pain, dyspnea on exertion, leg swelling, orthopnea and palpitations.  ?Respiratory: Negative for cough, hemoptysis, shortness of breath and snoring.   ?Endocrine: Negative for heat intolerance and polyphagia.  ?Hematologic/Lymphatic: Negative for bleeding problem. Does not bruise/bleed easily.  ?Skin: Negative for flushing, nail changes, rash and suspicious lesions.  ?Musculoskeletal: Negative for arthritis, joint pain, muscle cramps, myalgias, neck pain and stiffness.  ?Gastrointestinal: Negative for abdominal pain, bowel incontinence, diarrhea and excessive appetite.  ?Genitourinary: Negative for decreased libido, genital sores and incomplete emptying.  ?Neurological: Negative for brief paralysis, focal weakness, headaches and loss of balance.  ?Psychiatric/Behavioral: Negative for altered mental status, depression and suicidal ideas.  ?Allergic/Immunologic: Negative for HIV exposure and persistent infections.  ? ? ?EKGs/Labs/Other Studies Reviewed:   ? ?The following studies were reviewed today: ? ? ?EKG:  The ekg ordered today demonstrates sinus rhythm ? ?TTE 2022 IMPRESSIONS  ? 1. Left ventricular ejection fraction, by estimation, is 70 to 75%. The left ventricle has hyperdynamic function. The left ventricle has no  ?regional wall motion abnormalities. Left ventricular diastolic parameters are indeterminate.  ? 2. Right ventricular systolic function is normal. The right ventricular size is normal.  ? 3. Left atrial size was mildly  dilated.  ? 4. The mitral valve is normal in structure. Mild mitral valve regurgitation.  ? 5. The aortic valve is normal in structure. Aortic valve regurgitation is not visualized.  ? 6. The inferior vena cava is normal in size with greater than 50% respiratory variability, suggesting right atrial pressure of 3 mmHg.  ? ?FINDINGS  ? Left Ventricle: Left ventricular ejection fraction, by estimation, is 70 to 75%. The left ventricle has hyperdynamic function. The left ventricle  ?has no regional wall motion abnormalities. The left ventricular internal cavity size was normal in size.  ?There is no left ventricular hypertrophy. Left ventricular diastolic parameters are indeterminate.  ? ?Right Ventricle: The right ventricular size is normal. Right vetricular wall thickness was not assessed. Right ventricular systolic function is  ?normal.  ? ?Left Atrium: Left atrial size was mildly dilated.  ? ?Right Atrium: Right atrial size was normal in size.  ? ?Pericardium: There is no  evidence of pericardial effusion.  ? ?Mitral Valve: The mitral valve is normal in structure. Mild mitral valve regurgitation.  ? ?Tricuspid Valve: The tricuspid valve is normal in structure. Tricuspid valve regurgitation is trivial.  ? ?Aortic Valve: The aortic valve is normal in structure. Aortic valve regurgitation is not visualized.  ? ?Pulmonic Valve: The pulmonic valve was normal in structure. Pulmonic valve regurgitation is mild.  ? ?Aorta: The aortic root and ascending aorta are structurally normal, with no evidence of dilitation.  ? ?Venous: The inferior vena cava is normal in size with greater than 50% respiratory variability, suggesting right atrial pressure of 3 mmHg.  ? ?IAS/Shunts: No atrial level shunt detected by color flow Doppler.  ? ?   ?CCTA 08/19/2020 ?Aorta:  Normal size.  No calcifications.  No dissection. ?  ?Aortic Valve:  Trileaflet.  No calcifications. ?  ?Coronary Arteries:  Normal coronary origin.  Right dominance. ?   ?RCA is a large dominant artery that gives rise to PDA and PLA. There is no plaque. ?  ?Left main is a large artery that gives rise to LAD, Intermedius Ramus and LCX arteries. ?  ?LAD is a large vessel that has no pl

## 2021-08-28 NOTE — Patient Instructions (Signed)
Medication Instructions:  ?Your physician recommends that you continue on your current medications as directed. Please refer to the Current Medication list given to you today.  ?*If you need a refill on your cardiac medications before your next appointment, please call your pharmacy* ? ? ?Lab Work: ?None ?If you have labs (blood work) drawn today and your tests are completely normal, you will receive your results only by: ?MyChart Message (if you have MyChart) OR ?A paper copy in the mail ?If you have any lab test that is abnormal or we need to change your treatment, we will call you to review the results. ? ? ?Testing/Procedures: ?None ? ? ?Follow-Up: ?At [facility], you and your health needs are our priority.  As part of our continuing mission to provide you with exceptional heart care, we have created designated Provider Care Teams.  These Care Teams include your primary Cardiologist (physician) and Advanced Practice Providers (APPs -  Physician Assistants and Nurse Practitioners) who all work together to provide you with the care you need, when you need it. ? ?We recommend signing up for the patient portal called "MyChart".  Sign up information is provided on this After Visit Summary.  MyChart is used to connect with patients for Virtual Visits (Telemedicine).  Patients are able to view lab/test results, encounter notes, upcoming appointments, etc.  Non-urgent messages can be sent to your provider as well.   ?To learn more about what you can do with MyChart, go to [email or web address].   ? ?Your next appointment:   ?1 year(s) ? ?The format for your next appointment:   ?In Person ? ?Provider:   ?Berniece Salines, DO   ? ? ?Other Instructions ?Diabetes Mellitus and Nutrition, Adult ?When you have diabetes, or diabetes mellitus, it is very important to have healthy eating habits because your blood sugar (glucose) levels are greatly affected by what you eat and drink. Eating healthy foods in the right amounts, at  about the same times every day, can help you: ?Manage your blood glucose. ?Lower your risk of heart disease. ?Improve your blood pressure. ?Reach or maintain a healthy weight. ?What can affect my meal plan? ?Every person with diabetes is different, and each person has different needs for a meal plan. Your health care provider may recommend that you work with a dietitian to make a meal plan that is best for you. Your meal plan may vary depending on factors such as: ?The calories you need. ?The medicines you take. ?Your weight. ?Your blood glucose, blood pressure, and cholesterol levels. ?Your activity level. ?Other health conditions you have, such as heart or kidney disease. ?How do carbohydrates affect me? ?Carbohydrates, also called carbs, affect your blood glucose level more than any other type of food. Eating carbs raises the amount of glucose in your blood. ?It is important to know how many carbs you can safely have in each meal. This is different for every person. Your dietitian can help you calculate how many carbs you should have at each meal and for each snack. ?How does alcohol affect me? ?Alcohol can cause a decrease in blood glucose (hypoglycemia), especially if you use insulin or take certain diabetes medicines by mouth. Hypoglycemia can be a life-threatening condition. Symptoms of hypoglycemia, such as sleepiness, dizziness, and confusion, are similar to symptoms of having too much alcohol. ?Do not drink alcohol if: ?Your health care provider tells you not to drink. ?You are pregnant, may be pregnant, or are planning to become pregnant. ?If you drink  alcohol: ?Limit how much you have to: ?0-1 drink a day for women. ?0-2 drinks a day for men. ?Know how much alcohol is in your drink. In the [place] one drink equals one 12 oz bottle of beer (355 mL), one 5 oz glass of wine (148 mL), or one 1? oz glass of hard liquor (44 mL). ?Keep yourself hydrated with water, diet soda, or unsweetened iced tea. Keep in mind  that regular soda, juice, and other mixers may contain a lot of sugar and must be counted as carbs. ?What are tips for following this plan? ? ?Reading food labels ?Start by checking the serving size on the Nutrition Facts label of packaged foods and drinks. The number of calories and the amount of carbs, fats, and other nutrients listed on the label are based on one serving of the item. Many items contain more than one serving per package. ?Check the total grams (g) of carbs in one serving. ?Check the number of grams of saturated fats and trans fats in one serving. Choose foods that have a low amount or none of these fats. ?Check the number of milligrams (mg) of salt (sodium) in one serving. Most people should limit total sodium intake to less than 2,300 mg per day. ?Always check the nutrition information of foods labeled as "low-fat" or "nonfat." These foods may be higher in added sugar or refined carbs and should be avoided. ?Talk to your dietitian to identify your daily goals for nutrients listed on the label. ?Shopping ?Avoid buying canned, pre-made, or processed foods. These foods tend to be high in fat, sodium, and added sugar. ?Shop around the outside edge of the grocery store. This is where you will most often find fresh fruits and vegetables, bulk grains, fresh meats, and fresh dairy products. ?Cooking ?Use low-heat cooking methods, such as baking, instead of high-heat cooking methods, such as deep frying. ?Cook using healthy oils, such as olive, canola, or sunflower oil. ?Avoid cooking with butter, cream, or high-fat meats. ?Meal planning ?Eat meals and snacks regularly, preferably at the same times every day. Avoid going long periods of time without eating. ?Eat foods that are high in fiber, such as fresh fruits, vegetables, beans, and whole grains. ?Eat 4-6 oz (112-168 g) of lean protein each day, such as lean meat, chicken, fish, eggs, or tofu. One ounce (oz) (28 g) of lean protein is equal to: ?1 oz  (28 g) of meat, chicken, or fish. ?1 egg. ?? cup (62 g) of tofu. ?Eat some foods each day that contain healthy fats, such as avocado, nuts, seeds, and fish. ?What foods should I eat? ?Fruits ?Berries. Apples. Oranges. Peaches. Apricots. Plums. Grapes. Mangoes. Papayas. Pomegranates. Kiwi. Cherries. ?Vegetables ?Leafy greens, including lettuce, spinach, kale, chard, collard greens, mustard greens, and cabbage. Beets. Cauliflower. Broccoli. Carrots. Green beans. Tomatoes. Peppers. Onions. Cucumbers. Brussels sprouts. ?Grains ?Whole grains, such as whole-wheat or whole-grain bread, crackers, tortillas, cereal, and pasta. Unsweetened oatmeal. Quinoa. Brett or wild rice. ?Meats and other proteins ?Seafood. Poultry without skin. Lean cuts of poultry and beef. Tofu. Nuts. Seeds. ?Dairy ?Low-fat or fat-free dairy products such as milk, yogurt, and cheese. ?The items listed above may not be a complete list of foods and beverages you can eat and drink. Contact a dietitian for more information. ?What foods should I avoid? ?Fruits ?Fruits canned with syrup. ?Vegetables ?Canned vegetables. Frozen vegetables with butter or cream sauce. ?Grains ?Refined white flour and flour products such as bread, pasta, snack foods, and cereals. Avoid  all processed foods. ?Meats and other proteins ?Fatty cuts of meat. Poultry with skin. Breaded or fried meats. Processed meat. Avoid saturated fats. ?Dairy ?Full-fat yogurt, cheese, or milk. ?Beverages ?Sweetened drinks, such as soda or iced tea. ?The items listed above may not be a complete list of foods and beverages you should avoid. Contact a dietitian for more information. ?Questions to ask a health care provider ?Do I need to meet with a certified diabetes care and education specialist? ?Do I need to meet with a dietitian? ?What number can I call if I have questions? ?When are the best times to check my blood glucose? ?Where to find more information: ?American Diabetes Association:  diabetes.org ?Academy of Nutrition and Dietetics: eatright.org ?[employer] of Diabetes and Digestive and Kidney Diseases: AmenCredit.is ?Association of Diabetes Care & Education Specialists: diabeteseduc

## 2021-09-16 ENCOUNTER — Encounter: Payer: Self-pay | Admitting: Internal Medicine

## 2021-11-06 ENCOUNTER — Other Ambulatory Visit: Payer: Self-pay | Admitting: Cardiology

## 2021-12-21 ENCOUNTER — Encounter: Payer: Self-pay | Admitting: Internal Medicine

## 2022-02-04 ENCOUNTER — Other Ambulatory Visit: Payer: Self-pay | Admitting: Obstetrics and Gynecology

## 2022-02-04 DIAGNOSIS — N6489 Other specified disorders of breast: Secondary | ICD-10-CM

## 2022-02-20 ENCOUNTER — Ambulatory Visit
Admission: RE | Admit: 2022-02-20 | Discharge: 2022-02-20 | Disposition: A | Discharge status: Home / Self Care | Payer: 59 | Source: Ambulatory Visit | Attending: Obstetrics and Gynecology | Admitting: Obstetrics and Gynecology

## 2022-02-20 ENCOUNTER — Other Ambulatory Visit: Payer: Self-pay | Admitting: Obstetrics and Gynecology

## 2022-02-20 DIAGNOSIS — N6489 Other specified disorders of breast: Secondary | ICD-10-CM

## 2022-02-20 DIAGNOSIS — N6320 Unspecified lump in the left breast, unspecified quadrant: Secondary | ICD-10-CM

## 2022-03-05 ENCOUNTER — Ambulatory Visit
Admission: RE | Admit: 2022-03-05 | Discharge: 2022-03-05 | Disposition: A | Discharge status: Home / Self Care | Payer: 59 | Source: Ambulatory Visit | Attending: Obstetrics and Gynecology | Admitting: Obstetrics and Gynecology

## 2022-03-05 DIAGNOSIS — N6489 Other specified disorders of breast: Secondary | ICD-10-CM

## 2022-03-05 DIAGNOSIS — N6320 Unspecified lump in the left breast, unspecified quadrant: Secondary | ICD-10-CM

## 2022-03-05 HISTORY — PX: BREAST BIOPSY: SHX20

## 2022-08-10 ENCOUNTER — Encounter: Payer: Self-pay | Admitting: Internal Medicine

## 2022-09-22 ENCOUNTER — Inpatient Hospital Stay: Admission: RE | Admit: 2022-09-22 | Payer: Self-pay | Source: Ambulatory Visit

## 2022-09-22 ENCOUNTER — Encounter: Payer: Self-pay | Admitting: Emergency Medicine

## 2022-09-22 ENCOUNTER — Ambulatory Visit
Admission: EM | Admit: 2022-09-22 | Discharge: 2022-09-22 | Disposition: A | Discharge status: Home / Self Care | Payer: 59 | Attending: Family Medicine | Admitting: Family Medicine

## 2022-09-22 DIAGNOSIS — R059 Cough, unspecified: Secondary | ICD-10-CM

## 2022-09-22 DIAGNOSIS — J309 Allergic rhinitis, unspecified: Secondary | ICD-10-CM

## 2022-09-22 DIAGNOSIS — J0100 Acute maxillary sinusitis, unspecified: Secondary | ICD-10-CM | POA: Diagnosis not present

## 2022-09-22 DIAGNOSIS — R0981 Nasal congestion: Secondary | ICD-10-CM

## 2022-09-22 MED ORDER — FEXOFENADINE HCL 180 MG PO TABS: 180.0000 mg | ORAL_TABLET | Freq: Every day | ORAL | 0 refills | Status: AC

## 2022-09-22 MED ORDER — PREDNISONE 10 MG (21) PO TBPK: ORAL_TABLET | Freq: Every day | ORAL | 0 refills | Status: DC

## 2022-09-22 MED ORDER — PROMETHAZINE-DM 6.25-15 MG/5ML PO SYRP: 5.0000 mL | ORAL_SOLUTION | Freq: Two times a day (BID) | ORAL | 0 refills | Status: DC | PRN

## 2022-09-22 MED ORDER — AMOXICILLIN-POT CLAVULANATE 875-125 MG PO TABS: 1.0000 | ORAL_TABLET | Freq: Two times a day (BID) | ORAL | 0 refills | Status: AC

## 2022-09-22 MED ORDER — BENZONATATE 200 MG PO CAPS: 200.0000 mg | ORAL_CAPSULE | Freq: Three times a day (TID) | ORAL | 0 refills | Status: AC | PRN

## 2022-09-22 NOTE — ED Triage Notes (Signed)
Patient c/o nasal congestion, cough, sore throat x 4 days.  Patient did see her dentist last week and had a sinus xray showed cloudy sinuses.  Patient has taken Mucinex Cold & Flu.  Cough drops and tea.

## 2022-09-22 NOTE — Discharge Instructions (Addendum)
Patient to take medication as directed with food to completion.  Advised patient to take prednisone and Allegra with first dose of Augmentin for the next 10 days.  Advised may discontinue Allegra after 5 days and use as needed for concurrent postnasal drainage/drip.  Advised may use Tessalon Perles daily or as needed for cough.  Advised may use Promethazine DM at night prior to sleep for cough due to sedative effects.  Advised patient not to use cough medications together.  Encouraged increase daily water intake to 64 ounces per day while taking these medications.  Advised if symptoms worsen and/or unresolved please follow-up with PCP or here for further evaluation.

## 2022-09-22 NOTE — ED Provider Notes (Signed)
[facility]    CSN: [id number] Arrival date & time: 09/22/22  1416      History   Chief Complaint Chief Complaint  Patient presents with   Cough    HPI Felicia Monroe is a 52 y.o. female.   HPI 52 year old female presents with cough, nasal congestion and sore throat for 4 days.  Patient reports that her dentist revealed x-ray last week performed last week revealed cloudy sinuses.  Patient reports that cough has disrupted sleep at night. PMH significant for morbid obesity, HTN, and T2DM without complication.  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    Family history of colon cancer - father in 37's 03/24/2013   Brother and sister also have polyps    Fluttering sensation of heart 07/03/2013   H/O chlamydia infection 04/07/93   H/O infertility 2004   H/O myomectomy 08/11/2011   For repeat cesarean section    H/O polydactyly    Heartburn in pregnancy    HTN (hypertension) 09/14/2011   Hx of hyperprolactinemia 08/2002   Hypertension    Irregular periods/menstrual cycles    Left shoulder pain 10/03/2017   Microadenoma 08/2010   Morbid obesity (HCC) 07/26/2011   Obesity    Personal history of colonic adenomas 04/21/2010   Previous cesarean delivery, antepartum condition or complication 08/11/2011   For repeat cesarean 09/10/11 AVS    Status post repeat low transverse cesarean section 09/11/2011   Uterine fibroid 10/1999    Patient Active Problem List   Diagnosis Date Noted   Murmur 08/28/2021   Type 2 diabetes mellitus without complication, without long-term current use of insulin (HCC) 08/28/2021   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   Obesity    Hypertension     Heartburn in pregnancy    H/O polydactyly    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    AMA (advanced maternal age) multigravida 35+    Allergy    Left shoulder pain 10/03/2017   PCP NOTES >>> 02/17/2015   Fluttering sensation of heart 07/03/2013   Acid reflux 07/03/2013   Family history of colon cancer - father in 43's 03/24/2013   Annual physical exam 10/04/2012   HTN (hypertension) 09/14/2011   Status post repeat low transverse cesarean section 09/11/2011   Anemia 09/11/2011   H/O myomectomy 08/11/2011   Previous cesarean delivery, antepartum condition or complication 08/11/2011   Morbid obesity (HCC) 07/26/2011   Microadenoma 08/2010   Back pain 07/27/2010   Personal history of colonic adenomas 04/21/2010   Hx of hyperprolactinemia 08/2002   H/O infertility 2004   Uterine fibroid 10/1999   H/O chlamydia infection 04/07/1993    Past Surgical History:  Procedure Laterality Date   BREAST BIOPSY Left 03/05/2022   [place] LT BREAST BX W LOC DEV 1ST LESION IMG BX SPEC US GUIDE 03/05/2022 [facility]   CESAREAN SECTION     x2   COLONOSCOPY     MYOMECTOMY  07/2003   TUBAL LIGATION     UTERINE FIBROID SURGERY  2005   myomectomy x 1    OB History     Gravida  2  Para  2   Term  1   Preterm      AB      Living  2      SAB      IAB      Ectopic      Multiple      Live Births  2            Home Medications    Prior to Admission medications   Medication Sig Start Date End Date Taking? Authorizing Provider  amLODipine (NORVASC) 2.5 MG tablet TAKE 1 TABLET BY MOUTH EVERY DAY 11/06/21  Yes Tobb, Kardie, DO  amoxicillin-clavulanate (AUGMENTIN) 875-125 MG tablet Take 1 tablet by mouth 2 (two) times daily for 10 days. 09/22/22 10/02/22 Yes Trevor Iha, FNP  benzonatate (TESSALON) 200 MG capsule Take 1 capsule (200 mg total) by mouth 3 (three) times daily as needed for up to 7 days. 09/22/22 09/29/22 Yes Trevor Iha, FNP  empagliflozin (JARDIANCE) 10 MG TABS  tablet Take 1 tablet (10 mg total) by mouth daily before breakfast. 07/01/21  Yes Paz, Nolon Rod, MD  fexofenadine [facility] ALLERGY) 180 MG tablet Take 1 tablet (180 mg total) by mouth daily for 15 days. 09/22/22 10/07/22 Yes Trevor Iha, FNP  glucose blood test strip Use as instructed to check blood sugar up to BID 06/17/21  Yes Copland, Gwenlyn Found, MD  Lancets 30G MISC 1 each by Does not apply route 2 (two) times daily. 06/17/21  Yes Copland, Gwenlyn Found, MD  predniSONE (STERAPRED UNI-PAK 21 TAB) 10 MG (21) TBPK tablet Take by mouth daily. Take 6 tabs by mouth daily  for 2 days, then 5 tabs for 2 days, then 4 tabs for 2 days, then 3 tabs for 2 days, 2 tabs for 2 days, then 1 tab by mouth daily for 2 days 09/22/22  Yes Trevor Iha, FNP  promethazine-dextromethorphan (PROMETHAZINE-DM) 6.25-15 MG/5ML syrup Take 5 mLs by mouth 2 (two) times daily as needed for cough. 09/22/22  Yes Trevor Iha, FNP  triamterene-hydrochlorothiazide (MAXZIDE-25) 37.5-25 MG tablet TAKE 1 TABLET BY MOUTH DAILY 03/18/21  Yes Wanda Plump, MD    Family History Family History  Problem Relation Age of Onset   Colon cancer Father 14           Hypertension Father    Colon polyps Brother        x 2   Hypertension Mother    Diabetes Mother    Colon polyps Sister    Coronary artery disease Other        GM, aunt   Diabetes Sister    Heart disease Maternal Grandmother    Diabetes Maternal Grandmother    Breast cancer Neg Hx    Esophageal cancer Neg Hx    Rectal cancer Neg Hx    Stomach cancer Neg Hx     Social History Social History   Tobacco Use   Smoking status: Never   Smokeless tobacco: Never  Vaping Use   Vaping Use: Never used  Substance Use Topics   Alcohol use: Yes    Comment: socially   Drug use: No     Allergies   Patient has no known allergies.   Review of Systems Review of Systems  HENT:  Positive for congestion and sore throat.   Respiratory:  Positive for cough.   All other systems  reviewed and are negative.    Physical Exam Triage Vital Signs ED Triage Vitals  Enc Vitals Group  BP      Pulse      Resp      Temp      Temp src      SpO2      Weight      Height      Head Circumference      Peak Flow      Pain Score      Pain Loc      Pain Edu?      Excl. in GC?    No data found.  Updated Vital Signs BP (!) 145/86 (BP Location: Right Arm)   Pulse 91   Temp 97.9 F (36.6 C) (Oral)   Resp 18   Ht 5\' 5"  (1.651 m)   Wt 263 lb (119.3 kg)   SpO2 98%   BMI 43.77 kg/m       Physical Exam Vitals and nursing note reviewed.  Constitutional:      Appearance: Normal appearance. She is obese. She is ill-appearing.  HENT:     Head: Normocephalic and atraumatic.     Right Ear: Tympanic membrane and external ear normal.     Left Ear: Tympanic membrane and external ear normal.     Ears:     Comments: Significant eustachian tube dysfunction noted bilaterally    Nose:     Right Sinus: Maxillary sinus tenderness present.     Left Sinus: Maxillary sinus tenderness present.     Comments: Turbinates are erythematous/edematous    Mouth/Throat:     Mouth: Mucous membranes are moist.     Pharynx: Oropharynx is clear. Uvula midline. Posterior oropharyngeal erythema and uvula swelling present.     Comments: Significant amounts of clear drainage of posterior oropharynx noted Eyes:     Extraocular Movements: Extraocular movements intact.     Conjunctiva/sclera: Conjunctivae normal.     Pupils: Pupils are equal, round, and reactive to light.  Cardiovascular:     Rate and Rhythm: Normal rate and regular rhythm.     Pulses: Normal pulses.     Heart sounds: Normal heart sounds.  Pulmonary:     Effort: Pulmonary effort is normal.     Breath sounds: Normal breath sounds. No wheezing, rhonchi or rales.     Comments: Infrequent nonproductive cough noted on exam Musculoskeletal:        General: Normal range of motion.     Cervical back: Normal range of motion and  neck supple.  Skin:    General: Skin is warm and dry.  Neurological:     General: No focal deficit present.     Mental Status: She is alert and oriented to person, place, and time. Mental status is at baseline.  Psychiatric:        Mood and Affect: Mood normal.        Behavior: Behavior normal.      UC Treatments / Results  Labs (all labs ordered are listed, but only abnormal results are displayed) Labs Reviewed - No data to display  EKG   Radiology No results found.  Procedures Procedures (including critical care time)  Medications Ordered in UC Medications - No data to display  Initial Impression / Assessment and Plan / UC Course  I have reviewed the triage vital signs and the nursing notes.  Pertinent labs & imaging results that were available during my care of the patient were reviewed by me and considered in my medical decision making (see chart for details).     MDM:  1.  Acute maxillary sinusitis, recurrence not specified-Rx'd Augmentin 875/125 mg tablet twice daily x 10 days; 2.  Congestion of nasal sinus-Rx'd Sterapred Unipak (tapering from 60 mg to 10 mg over 10 days); 3.  Cough, unspecified type-Rx'd Tessalon Perles 200 mg 3 times daily, as needed, Promethazine DM 6.25-15 mg/ 5mL. Patient to take medication as directed with food to completion.  Advised patient to take prednisone and Allegra with first dose of Augmentin for the next 10 days.  Advised may discontinue Allegra after 5 days and use as needed for concurrent postnasal drainage/drip.  Advised may use Tessalon Perles daily or as needed for cough.  Advised may use Promethazine DM at night prior to sleep for cough due to sedative effects.  Advised patient not to use cough medications together.  Encouraged increase daily water intake to 64 ounces per day while taking these medications.  Advised if symptoms worsen and/or unresolved please follow-up with PCP or here for further evaluation.         Final Clinical  Impressions(s) / UC Diagnoses   Final diagnoses:  Cough, unspecified type  Acute maxillary sinusitis, recurrence not specified  Congestion of nasal sinus  Allergic rhinitis, unspecified seasonality, unspecified trigger     Discharge Instructions      Patient to take medication as directed with food to completion.  Advised patient to take prednisone and Allegra with first dose of Augmentin for the next 10 days.  Advised may discontinue Allegra after 5 days and use as needed for concurrent postnasal drainage/drip.  Advised may use Tessalon Perles daily or as needed for cough.  Advised may use Promethazine DM at night prior to sleep for cough due to sedative effects.  Advised patient not to use cough medications together.  Encouraged increase daily water intake to 64 ounces per day while taking these medications.  Advised if symptoms worsen and/or unresolved please follow-up with PCP or here for further evaluation.     ED Prescriptions     Medication Sig Dispense Auth. Provider   amoxicillin-clavulanate (AUGMENTIN) 875-125 MG tablet Take 1 tablet by mouth 2 (two) times daily for 10 days. 20 tablet Trevor Iha, FNP   predniSONE (STERAPRED UNI-PAK 21 TAB) 10 MG (21) TBPK tablet Take by mouth daily. Take 6 tabs by mouth daily  for 2 days, then 5 tabs for 2 days, then 4 tabs for 2 days, then 3 tabs for 2 days, 2 tabs for 2 days, then 1 tab by mouth daily for 2 days 42 tablet Trevor Iha, FNP   fexofenadine [facility] ALLERGY) 180 MG tablet Take 1 tablet (180 mg total) by mouth daily for 15 days. 15 tablet Trevor Iha, FNP   benzonatate (TESSALON) 200 MG capsule Take 1 capsule (200 mg total) by mouth 3 (three) times daily as needed for up to 7 days. 40 capsule Trevor Iha, FNP   promethazine-dextromethorphan (PROMETHAZINE-DM) 6.25-15 MG/5ML syrup Take 5 mLs by mouth 2 (two) times daily as needed for cough. 118 mL Trevor Iha, FNP      PDMP not reviewed this encounter.   Trevor Iha, FNP 09/22/22 1515

## 2022-11-10 ENCOUNTER — Other Ambulatory Visit: Payer: Self-pay | Admitting: Cardiology

## 2022-12-11 ENCOUNTER — Other Ambulatory Visit: Payer: Self-pay | Admitting: Cardiology

## 2023-01-13 ENCOUNTER — Other Ambulatory Visit: Payer: Self-pay | Admitting: Cardiology

## 2023-02-11 ENCOUNTER — Encounter: Payer: Self-pay | Admitting: Cardiology

## 2023-02-11 ENCOUNTER — Ambulatory Visit: Payer: 59 | Attending: Cardiology | Admitting: Cardiology

## 2023-02-11 VITALS — BP 132/80 | HR 71 | Ht 65.0 in | Wt 270.0 lb

## 2023-02-11 DIAGNOSIS — I10 Essential (primary) hypertension: Secondary | ICD-10-CM

## 2023-02-11 DIAGNOSIS — E119 Type 2 diabetes mellitus without complications: Secondary | ICD-10-CM

## 2023-02-11 DIAGNOSIS — I201 Angina pectoris with documented spasm: Secondary | ICD-10-CM | POA: Diagnosis not present

## 2023-02-11 NOTE — Patient Instructions (Signed)
Medication Instructions:  NO CHANGES  *If you need a refill on your cardiac medications before your next appointment, please call your pharmacy*  Follow-Up: At [facility], you and your health needs are our priority.  As part of our continuing mission to provide you with exceptional heart care, we have created designated Provider Care Teams.  These Care Teams include your primary Cardiologist (physician) and Advanced Practice Providers (APPs -  Physician Assistants and Nurse Practitioners) who all work together to provide you with the care you need, when you need it.  We recommend signing up for the patient portal called "MyChart".  Sign up information is provided on this After Visit Summary.  MyChart is used to connect with patients for Virtual Visits (Telemedicine).  Patients are able to view lab/test results, encounter notes, upcoming appointments, etc.  Non-urgent messages can be sent to your provider as well.   To learn more about what you can do with MyChart, go to [email or web address].    Your next appointment:    6 months with Dr. Servando Salina in person ** call in December for an April 2025 appointment

## 2023-02-11 NOTE — Progress Notes (Unsigned)
Cardiology Office Note:    Date:  02/13/2023   ID:  Felicia Monroe, DOB December 02, 1970, MRN [id number]  PCP:  Aliene Beams, MD  Cardiologist:  Thomasene Ripple, DO  Electrophysiologist:  None   Referring MD: Aliene Beams, MD   ' I am ok"   History of Present Illness:    Felicia Monroe is a 52 y.o. female with a hx of type 2 diabetes mellitus, morbid obesity, hypertension here for follow-up visit.  Since her last visit she has been doing well.  She presents for a routine follow-up. The patient is currently on amlodipine and hydrochlorothiazide for hypertension management.The patient does not have a home blood pressure monitor but plans to get one. The patient has not experienced any hospitalizations since the last visit.  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    Family history of colon cancer - father in 34's 03/24/2013   Brother and sister also have polyps    Fluttering sensation of heart 07/03/2013   H/O chlamydia infection 04/07/93   H/O infertility 2004   H/O myomectomy 08/11/2011   For repeat cesarean section    H/O polydactyly    Heartburn in pregnancy    HTN (hypertension) 09/14/2011   Hx of hyperprolactinemia 08/2002   Hypertension    Irregular periods/menstrual cycles    Left shoulder pain 10/03/2017   Microadenoma 08/2010   Morbid obesity (HCC) 07/26/2011   Obesity    Personal history of colonic adenomas 04/21/2010   Previous cesarean delivery, antepartum condition or complication 08/11/2011   For repeat cesarean 09/10/11 AVS    Status post repeat low transverse cesarean section 09/11/2011   Uterine fibroid 10/1999    Past Surgical History:  Procedure Laterality Date   BREAST BIOPSY Left 03/05/2022   [place] LT BREAST  BX W LOC DEV 1ST LESION IMG BX SPEC US GUIDE 03/05/2022 [facility]   CESAREAN SECTION     x2   COLONOSCOPY     MYOMECTOMY  07/2003   TUBAL LIGATION     UTERINE FIBROID SURGERY  2005   myomectomy x 1    Current Medications: Current Meds  Medication Sig   amLODipine (NORVASC) 2.5 MG tablet TAKE 1 TABLET BY MOUTH EVERY DAY   empagliflozin (JARDIANCE) 10 MG TABS tablet Take 1 tablet (10 mg total) by mouth daily before breakfast.   glucose blood test strip Use as instructed to check blood sugar up to BID   Lancets 30G MISC 1 each by Does not apply route 2 (two) times daily.   promethazine-dextromethorphan (PROMETHAZINE-DM) 6.25-15 MG/5ML syrup Take 5 mLs by mouth 2 (two) times daily as needed for cough.   triamterene-hydrochlorothiazide (MAXZIDE-25) 37.5-25 MG tablet TAKE 1 TABLET BY MOUTH DAILY     Allergies:   Patient has no known allergies.   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[employer]- [employer]   Tobacco Use   Smoking status: Never   Smokeless  tobacco: Never  Vaping Use   Vaping status: Never Used  Substance and Sexual Activity   Alcohol use: Yes    Comment: socially   Drug use: No   Sexual activity: Not Currently    Birth control/protection: Surgical    Comment: BTL  Other Topics Concern   Not on file  Social History Narrative   household- pt, husband, children   Two children  2006, 2013   Social Determinants of Corporate investment banker Strain: Not on file  Food Insecurity: Not on file  Transportation Needs: Not on file  Physical Activity: Not on file  Stress: Not on file  Social Connections: Unknown (09/06/2021)   Received from [facility], [facility]   Social Network    Social Network: Not on file     Family History: The patient's family  history includes Colon cancer (age of onset: 52) in her father; Colon polyps in her brother and sister; Coronary artery disease in an other family member; Diabetes in her maternal grandmother, mother, and sister; Heart disease in her maternal grandmother; Hypertension in her father and mother. There is no history of Breast cancer, Esophageal cancer, Rectal cancer, or Stomach cancer.  ROS:   Review of Systems  Constitution: Negative for decreased appetite, fever and weight gain.  HENT: Negative for congestion, ear discharge, hoarse voice and sore throat.   Eyes: Negative for discharge, redness, vision loss in right eye and visual halos.  Cardiovascular: Negative for chest pain, dyspnea on exertion, leg swelling, orthopnea and palpitations.  Respiratory: Negative for cough, hemoptysis, shortness of breath and snoring.   Endocrine: Negative for heat intolerance and polyphagia.  Hematologic/Lymphatic: Negative for bleeding problem. Does not bruise/bleed easily.  Skin: Negative for flushing, nail changes, rash and suspicious lesions.  Musculoskeletal: Negative for arthritis, joint pain, muscle cramps, myalgias, neck pain and stiffness.  Gastrointestinal: Negative for abdominal pain, bowel incontinence, diarrhea and excessive appetite.  Genitourinary: Negative for decreased libido, genital sores and incomplete emptying.  Neurological: Negative for brief paralysis, focal weakness, headaches and loss of balance.  Psychiatric/Behavioral: Negative for altered mental status, depression and suicidal ideas.  Allergic/Immunologic: Negative for HIV exposure and persistent infections.    EKGs/Labs/Other Studies Reviewed:    The following studies were reviewed today:   EKG:  The ekg ordered today demonstrates sinus rhythm, HR 71 bpm  TTE 2022 IMPRESSIONS   1. Left ventricular ejection fraction, by estimation, is 70 to 75%. The left ventricle has hyperdynamic function. The left ventricle has no  regional  wall motion abnormalities. Left ventricular diastolic parameters are indeterminate.   2. Right ventricular systolic function is normal. The right ventricular size is normal.   3. Left atrial size was mildly dilated.   4. The mitral valve is normal in structure. Mild mitral valve regurgitation.   5. The aortic valve is normal in structure. Aortic valve regurgitation is not visualized.   6. The inferior vena cava is normal in size with greater than 50% respiratory variability, suggesting right atrial pressure of 3 mmHg.   FINDINGS   Left Ventricle: Left ventricular ejection fraction, by estimation, is 70 to 75%. The left ventricle has hyperdynamic function. The left ventricle  has no regional wall motion abnormalities. The left ventricular internal cavity size was normal in size.  There is no left ventricular hypertrophy. Left ventricular diastolic parameters are indeterminate.   Right Ventricle: The right ventricular size is normal. Right vetricular wall thickness was not assessed. Right ventricular systolic function is  normal.   Left Atrium: Left atrial size was mildly dilated.   Right Atrium: Right atrial size was normal in size.   Pericardium: There is no evidence of pericardial effusion.   Mitral Valve: The mitral valve is normal in structure. Mild mitral valve regurgitation.   Tricuspid Valve: The tricuspid valve is normal in structure. Tricuspid valve regurgitation is trivial.   Aortic Valve: The aortic valve is normal in structure. Aortic valve regurgitation is not visualized.   Pulmonic Valve: The pulmonic valve was normal in structure. Pulmonic valve regurgitation is mild.   Aorta: The aortic root and ascending aorta are structurally normal, with no evidence of dilitation.   Venous: The inferior vena cava is normal in size with greater than 50% respiratory variability, suggesting right atrial pressure of 3 mmHg.   IAS/Shunts: No atrial level shunt detected by color flow  Doppler.      CCTA 08/19/2020 Aorta:  Normal size.  No calcifications.  No dissection.   Aortic Valve:  Trileaflet.  No calcifications.   Coronary Arteries:  Normal coronary origin.  Right dominance.   RCA is a large dominant artery that gives rise to PDA and PLA. There is no plaque.   Left main is a large artery that gives rise to LAD, Intermedius Ramus and LCX arteries.   LAD is a large vessel that has no plaque.   LCX is a non-dominant artery that gives rise to one large OM1 branch. There is no plaque.   Other findings:   Normal pulmonary vein drainage into the left atrium.   Normal left atrial appendage without a thrombus.   Normal size of the pulmonary artery.   IMPRESSION: 1. Coronary calcium score of 0. This was 0 percentile for age and sex matched control.   2. Normal coronary origin with right dominance.   3. No evidence of CAD. CAD-RADS 0. No evidence of CAD (0%). Consider non-atherosclerotic causes of chest pain.    Recent Labs: No results found for requested labs within last 365 days.  Recent Lipid Panel    Component Value Date/Time   CHOL 120 06/16/2020 1541   TRIG 74.0 06/16/2020 1541   HDL 51.20 06/16/2020 1541   CHOLHDL 2 06/16/2020 1541   VLDL 14.8 06/16/2020 1541   LDLCALC 54 06/16/2020 1541    Physical Exam:    VS:  BP 132/80   Pulse 71   Ht 5\' 5"  (1.651 m)   Wt 270 lb (122.5 kg)   SpO2 93%   BMI 44.93 kg/m     Wt Readings from Last 3 Encounters:  02/11/23 270 lb (122.5 kg)  09/22/22 263 lb (119.3 kg)  08/28/21 262 lb 3.2 oz (118.9 kg)     GEN: Well nourished, well developed in no acute distress HEENT: Normal NECK: No JVD; No carotid bruits LYMPHATICS: No lymphadenopathy CARDIAC: S1S2 noted,RRR, no murmurs, rubs, gallops RESPIRATORY:  Clear to auscultation without rales, wheezing or rhonchi  ABDOMEN: Soft, non-tender, non-distended, +bowel sounds, no guarding. EXTREMITIES: No edema, No cyanosis, no clubbing MUSCULOSKELETAL:  No  deformity  SKIN: Warm and dry NEUROLOGIC:  Alert and oriented x 3, non-focal PSYCHIATRIC:  Normal affect, good insight  ASSESSMENT:    1. Primary hypertension   2. Coronary vasospasm (HCC)   3. Type 2 diabetes mellitus without complication, without long-term current use of insulin (HCC)   4. Morbid obesity (HCC)    PLAN:    Hypertension Patient currently on Amlodipine and Triamterene-Hydrochlorothiazide. Discussed potential change in medication regimen  with primary care provider for kidney protection due to Diabetes. -Reach out to primary care provider to discuss potential change in medication regimen:Suggest keeping Amlodipine and Hydrochlorothiazide, discontinuing Triamterene, and adding Valsartan for kidney protection. -Plan for a virtual visit to follow up on medication changes and blood pressure control.  Type 2 Diabetes Mellitus  Discussed kidney protection in the context of hypertension management. -Continue current management as per primary care provider's plan.  Blood Pressure Monitoring Patient's current home blood pressure monitor may not be functioning correctly, and cuff size may be inappropriate. -Advise patient to purchase a new home blood pressure monitor with an appropriate cuff size. -Next visit, compare readings from new home monitor with clinic readings for accuracy.  Follow-up Patient has an appointment with primary care provider on March 01, 2023. -Ensure primary care provider receives note and medication suggestions before this appointment.  The patient understands the need to lose weight with diet and exercise. We have discussed specific strategies for this.  The patient is in agreement with the above plan. The patient left the office in stable condition.  The patient will follow up in   Medication Adjustments/Labs and Tests Ordered: Current medicines are reviewed at length with the patient today.  Concerns regarding medicines are outlined above.   Orders Placed This Encounter  Procedures   EKG 12-Lead   No orders of the defined types were placed in this encounter.   Patient Instructions  Medication Instructions:  NO CHANGES  *If you need a refill on your cardiac medications before your next appointment, please call your pharmacy*  Follow-Up: At [facility], you and your health needs are our priority.  As part of our continuing mission to provide you with exceptional heart care, we have created designated Provider Care Teams.  These Care Teams include your primary Cardiologist (physician) and Advanced Practice Providers (APPs -  Physician Assistants and Nurse Practitioners) who all work together to provide you with the care you need, when you need it.  We recommend signing up for the patient portal called "MyChart".  Sign up information is provided on this After Visit Summary.  MyChart is used to connect with patients for Virtual Visits (Telemedicine).  Patients are able to view lab/test results, encounter notes, upcoming appointments, etc.  Non-urgent messages can be sent to your provider as well.   To learn more about what you can do with MyChart, go to [email or web address].    Your next appointment:    6 months with Dr. Servando Salina in person ** call in December for an April 2025 appointment    Adopting a Healthy Lifestyle.  Know what a healthy weight is for you (roughly BMI <25) and aim to maintain this   Aim for 7+ servings of fruits and vegetables daily   65-80+ fluid ounces of water or unsweet tea for healthy kidneys   Limit to max 1 drink of alcohol per day; avoid smoking/tobacco   Limit animal fats in diet for cholesterol and heart health - choose grass fed whenever available   Avoid highly processed foods, and foods high in saturated/trans fats   Aim for low stress - take time to unwind and care for your mental health   Aim for 150 min of moderate intensity exercise weekly for heart health, and weights  twice weekly for bone health   Aim for 7-9 hours of sleep daily   When it comes to diets, agreement about the perfect plan isnt easy to find, even among the experts.  Experts at the [facility] [employer] developed an idea known as the Healthy Eating Plate. Just imagine a plate divided into logical, healthy portions.   The emphasis is on diet quality:   Load up on vegetables and fruits - one-half of your plate: Aim for color and variety, and remember that potatoes dont count.   Go for whole grains - one-quarter of your plate: Whole wheat, barley, wheat berries, quinoa, oats, Scharrer rice, and foods made with them. If you want pasta, go with whole wheat pasta.   Protein power - one-quarter of your plate: Fish, chicken, beans, and nuts are all healthy, versatile protein sources. Limit red meat.   The diet, however, does go beyond the plate, offering a few other suggestions.   Use healthy plant oils, such as olive, canola, soy, corn, sunflower and peanut. Check the labels, and avoid partially hydrogenated oil, which have unhealthy trans fats.   If youre thirsty, drink water. Coffee and tea are good in moderation, but skip sugary drinks and limit milk and dairy products to one or two daily servings.   The type of carbohydrate in the diet is more important than the amount. Some sources of carbohydrates, such as vegetables, fruits, whole grains, and beans-are healthier than others.   Finally, stay active  Signed, Thomasene Ripple, DO  02/13/2023 12:40 AM    [facility]

## 2023-02-15 ENCOUNTER — Other Ambulatory Visit: Payer: Self-pay | Admitting: Cardiology

## 2023-03-15 LAB — HM DIABETES EYE EXAM

## 2023-06-16 ENCOUNTER — Ambulatory Visit
Admission: RE | Admit: 2023-06-16 | Discharge: 2023-06-16 | Disposition: A | Discharge status: Home / Self Care | Payer: 59 | Source: Ambulatory Visit

## 2023-06-16 VITALS — BP 140/68 | HR 107 | Temp 99.3°F | Resp 18 | Ht 65.0 in | Wt 273.0 lb

## 2023-06-16 DIAGNOSIS — J22 Unspecified acute lower respiratory infection: Secondary | ICD-10-CM

## 2023-06-16 LAB — POCT INFLUENZA A/B
Influenza A, POC: NEGATIVE
Influenza B, POC: NEGATIVE

## 2023-06-16 LAB — POC SARS CORONAVIRUS 2 AG -  ED: SARS Coronavirus 2 Ag: NEGATIVE

## 2023-06-16 MED ORDER — FLUTICASONE PROPIONATE 50 MCG/ACT NA SUSP: 1.0000 | Freq: Two times a day (BID) | NASAL | 2 refills | Status: AC | PRN

## 2023-06-16 MED ORDER — AMOXICILLIN 875 MG PO TABS: 875.0000 mg | ORAL_TABLET | Freq: Two times a day (BID) | ORAL | 0 refills | Status: AC

## 2023-06-16 MED ORDER — PROMETHAZINE-DM 6.25-15 MG/5ML PO SYRP: 5.0000 mL | ORAL_SOLUTION | Freq: Three times a day (TID) | ORAL | 0 refills | Status: DC | PRN

## 2023-06-16 NOTE — Discharge Instructions (Addendum)
 All viral testing is negative.  treating you you with a broad-spectrum antibiotic that is sensitive to most lower respiratory bacterial infections.  Complete entire course of medication.  Promethazine DM for cough and congestion.

## 2023-06-16 NOTE — ED Triage Notes (Signed)
 Patient c/o nasal drainage, congestion, fever, body chills, sore throat x 3 days.  Patient has taken Chloricidin, vitamin c and honey.

## 2023-06-16 NOTE — ED Provider Notes (Signed)
 [facility]    CSN: [id number] Arrival date & time: 06/16/23  1840   t  History   Chief Complaint Chief Complaint  Patient presents with   Nasal Congestion    Also had fever and tiredness - Entered by patient    HPI Felicia Monroe is a 53 y.o. female.   HPI Patient presents today with a 3 day history of worsening cough, fever, congestion , generalized body aches, and fatigue. Patient endorses no known exposure to influenza or COVID. Patient currently has a low grade temp but reports yesterday temperature was >101. Patient concern for possible pneumonia, COVID or flu.  Taking over the counter medication without relief of symptoms. Endorses increase work of breathing, no wheezing, or SOB at rest.   .  Patient Active Problem List   Diagnosis Date Noted   Murmur 08/28/2021   Type 2 diabetes mellitus without complication, without long-term current use of insulin (HCC) 08/28/2021   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   Obesity    Hypertension    Heartburn in pregnancy    H/O polydactyly    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    AMA (advanced maternal age) multigravida 35+    Allergy    Left shoulder pain 10/03/2017   PCP NOTES >>> 02/17/2015   Fluttering sensation of heart 07/03/2013   Acid reflux 07/03/2013   Family history of colon cancer - father in 60's 03/24/2013   Annual physical exam 10/04/2012   HTN (hypertension) 09/14/2011   Status post repeat low transverse cesarean section 09/11/2011   Anemia 09/11/2011   H/O myomectomy 08/11/2011   Previous cesarean delivery, antepartum condition or complication 08/11/2011   Morbid obesity (HCC) 07/26/2011   Microadenoma 08/2010   Back pain 07/27/2010   History of colonic polyps 04/21/2010   Hx of hyperprolactinemia 08/2002   H/O infertility 2004   Uterine fibroid 10/1999   H/O chlamydia infection 04/07/1993    Past Surgical History:  Procedure  Laterality Date   BREAST BIOPSY Left 03/05/2022   [place] LT BREAST BX W LOC DEV 1ST LESION IMG BX SPEC US GUIDE 03/05/2022 [facility]   CESAREAN SECTION     x2   COLONOSCOPY     MYOMECTOMY  07/2003   TUBAL LIGATION     UTERINE FIBROID SURGERY  2005   myomectomy x 1    OB History     Gravida  2   Para  2   Term  1   Preterm      AB      Living  2      SAB      IAB      Ectopic      Multiple      Live Births  2            Home Medications    Prior to Admission medications   Medication Sig Start Date End Date Taking? Authorizing Provider  amLODipine (NORVASC) 2.5 MG tablet TAKE 1 TABLET BY MOUTH EVERY DAY 02/15/23  Yes Tobb, Kardie, DO  amoxicillin (AMOXIL) 875 MG tablet Take 1 tablet (875 mg total) by mouth 2 (two) times daily for 7 days. 06/16/23 06/23/23 Yes Bing Neighbors, NP  empagliflozin (JARDIANCE) 10 MG TABS tablet Take 1 tablet (10 mg total) by mouth daily before breakfast. 07/01/21  Yes Paz, Nolon Rod, MD  fluticasone [facility]) 50 MCG/ACT nasal spray Place 1 spray into both nostrils 2 (  two) times daily as needed for up to 7 days for allergies or rhinitis. 06/16/23 06/23/23 Yes Bing Neighbors, NP  glucose blood test strip Use as instructed to check blood sugar up to BID 06/17/21  Yes Copland, Gwenlyn Found, MD  Lancets 30G MISC 1 each by Does not apply route 2 (two) times daily. 06/17/21  Yes Copland, Gwenlyn Found, MD  promethazine-dextromethorphan (PROMETHAZINE-DM) 6.25-15 MG/5ML syrup Take 5 mLs by mouth 3 (three) times daily as needed for cough. 06/16/23  Yes Bing Neighbors, NP  valsartan-hydrochlorothiazide (DIOVAN-HCT) 80-12.5 MG tablet Take 1 tablet by mouth daily.   Yes [provider]  fexofenadine (ALLEGRA ALLERGY) 180 MG tablet Take 1 tablet (180 mg total) by mouth daily for 15 days. 09/22/22 10/07/22  Trevor Iha, FNP  fexofenadine (ALLEGRA ALLERGY) 180 MG tablet Take 1 tablet (180 mg total) by mouth daily for 15 days. 09/22/22 10/07/22   Trevor Iha, FNP  predniSONE (STERAPRED UNI-PAK 21 TAB) 10 MG (21) TBPK tablet Take by mouth daily. Take 6 tabs by mouth daily  for 2 days, then 5 tabs for 2 days, then 4 tabs for 2 days, then 3 tabs for 2 days, 2 tabs for 2 days, then 1 tab by mouth daily for 2 days Patient not taking: Reported on 02/11/2023 09/22/22   Trevor Iha, FNP  predniSONE (STERAPRED UNI-PAK 21 TAB) 10 MG (21) TBPK tablet Take by mouth daily. Take 6 tabs by mouth daily  for 2 days, then 5 tabs for 2 days, then 4 tabs for 2 days, then 3 tabs for 2 days, 2 tabs for 2 days, then 1 tab by mouth daily for 2 days Patient not taking: Reported on 02/11/2023 09/22/22   Trevor Iha, FNP  triamterene-hydrochlorothiazide (MAXZIDE-25) 37.5-25 MG tablet TAKE 1 TABLET BY MOUTH DAILY 03/18/21   Wanda Plump, MD    Family History Family History  Problem Relation Age of Onset   Colon cancer Father 77           Hypertension Father    Colon polyps Brother        x 2   Hypertension Mother    Diabetes Mother    Colon polyps Sister    Coronary artery disease Other        GM, aunt   Diabetes Sister    Heart disease Maternal Grandmother    Diabetes Maternal Grandmother    Breast cancer Neg Hx    Esophageal cancer Neg Hx    Rectal cancer Neg Hx    Stomach cancer Neg Hx     Social History Social History   Tobacco Use   Smoking status: Never   Smokeless tobacco: Never  Vaping Use   Vaping status: Never Used  Substance Use Topics   Alcohol use: Yes    Comment: socially   Drug use: No     Allergies   Patient has no known allergies.   Review of Systems Review of Systems Pertinent negatives listed in HPI   Physical Exam Triage Vital Signs ED Triage Vitals  Encounter Vitals Group     BP 06/16/23 1907 (!) 140/68     Systolic BP Percentile --      Diastolic BP Percentile --      Pulse Rate 06/16/23 1907 (!) 107     Resp 06/16/23 1907 18     Temp 06/16/23 1907 99.3 F (37.4 C)     Temp Source 06/16/23 1907  Oral     SpO2 06/16/23  1907 98 %     Weight 06/16/23 1908 273 lb (123.8 kg)     Height 06/16/23 1908 5\' 5"  (1.651 m)     Head Circumference --      Peak Flow --      Pain Score 06/16/23 1908 3     Pain Loc --      Pain Education --      Exclude from Growth Chart --    No data found.  Updated Vital Signs BP (!) 140/68 (BP Location: Right Arm)   Pulse (!) 107   Temp 99.3 F (37.4 C) (Oral)   Resp 18   Ht 5\' 5"  (1.651 m)   Wt 273 lb (123.8 kg)   SpO2 98%   BMI 45.43 kg/m   Visual Acuity Right Eye Distance:   Left Eye Distance:   Bilateral Distance:    Right Eye Near:   Left Eye Near:    Bilateral Near:     Physical Exam Vitals reviewed.  Constitutional:      Appearance: Normal appearance. She is ill-appearing.  HENT:     Head: Normocephalic and atraumatic.     Right Ear: There is no impacted cerumen.     Nose: Congestion and rhinorrhea present.     Mouth/Throat:     Pharynx: No oropharyngeal exudate or posterior oropharyngeal erythema.  Eyes:     Extraocular Movements: Extraocular movements intact.     Conjunctiva/sclera: Conjunctivae normal.     Pupils: Pupils are equal, round, and reactive to light.  Cardiovascular:     Rate and Rhythm: Regular rhythm. Tachycardia present.  Pulmonary:     Breath sounds: Rhonchi and rales present.  Musculoskeletal:     Cervical back: Normal range of motion and neck supple.  Neurological:     General: No focal deficit present.     Mental Status: She is alert.  Psychiatric:        Mood and Affect: Mood normal.        Behavior: Behavior normal.      UC Treatments / Results  Labs (all labs ordered are listed, but only abnormal results are displayed) Labs Reviewed  POCT INFLUENZA A/B - Normal  POC SARS CORONAVIRUS 2 AG -  ED    EKG   Radiology No results found.  Procedures Procedures (including critical care time)  Medications Ordered in UC Medications - No data to display  Initial Impression / Assessment  and Plan / UC Course  I have reviewed the triage vital signs and the nursing notes.  Pertinent labs & imaging results that were available during my care of the patient were reviewed by me and considered in my medical decision making (see chart for details).    Treating for an acute lower respiratory infection based on exam findings. Viral testing is negative for COVID and Flu. Treatment per discharge medications orders. Return precautions given if symptoms worsen or do not improve. Final Clinical Impressions(s) / UC Diagnoses   Final diagnoses:  Acute lower respiratory infection     Discharge Instructions      All viral testing is negative.  treating you you with a broad-spectrum antibiotic that is sensitive to most lower respiratory bacterial infections.  Complete entire course of medication.  Promethazine DM for cough and congestion.     ED Prescriptions     Medication Sig Dispense Auth. Provider   promethazine-dextromethorphan (PROMETHAZINE-DM) 6.25-15 MG/5ML syrup Take 5 mLs by mouth 3 (three) times daily as needed for cough.  118 mL Bing Neighbors, NP   amoxicillin (AMOXIL) 875 MG tablet Take 1 tablet (875 mg total) by mouth 2 (two) times daily for 7 days. 14 tablet Bing Neighbors, NP   fluticasone (FLONASE) 50 MCG/ACT nasal spray Place 1 spray into both nostrils 2 (two) times daily as needed for up to 7 days for allergies or rhinitis. 9.9 mL Bing Neighbors, NP      PDMP not reviewed this encounter.   Bing Neighbors, NP 06/19/23 2156

## 2023-07-18 HISTORY — PX: KNEE SURGERY: SHX244

## 2023-08-09 ENCOUNTER — Other Ambulatory Visit: Payer: Self-pay

## 2023-08-09 ENCOUNTER — Ambulatory Visit: Attending: Physician Assistant | Admitting: Physical Therapy

## 2023-08-09 ENCOUNTER — Encounter: Payer: Self-pay | Admitting: Physical Therapy

## 2023-08-09 DIAGNOSIS — M25562 Pain in left knee: Secondary | ICD-10-CM | POA: Insufficient documentation

## 2023-08-09 DIAGNOSIS — M6281 Muscle weakness (generalized): Secondary | ICD-10-CM | POA: Insufficient documentation

## 2023-08-09 NOTE — Therapy (Signed)
 OUTPATIENT PHYSICAL THERAPY LOWER EXTREMITY EVALUATION   Patient Name: Felicia Monroe MRN: [id number] DOB:[birth year], 53 y.o., female Today's Date: 08/09/2023  END OF SESSION:  PT End of Session - 08/09/23 1013     Visit Number 1    Number of Visits 8    Date for PT Re-Evaluation 10/04/23    Authorization Type [employer]    PT Start Time 0930    PT Stop Time 1007    PT Time Calculation (min) 37 min    Activity Tolerance Patient tolerated treatment well    Behavior During Therapy WFL for tasks assessed/performed             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    Family history of colon cancer - father in 38's 03/24/2013   Brother and sister also have polyps    Fluttering sensation of heart 07/03/2013   H/O chlamydia infection 04/07/93   H/O infertility 2004   H/O myomectomy 08/11/2011   For repeat cesarean section    H/O polydactyly    Heartburn in pregnancy    HTN (hypertension) 09/14/2011   Hx of hyperprolactinemia 08/2002   Hypertension    Irregular periods/menstrual cycles    Left shoulder pain 10/03/2017   Microadenoma 08/2010   Morbid obesity (HCC) 07/26/2011   Obesity    Personal history of colonic adenomas 04/21/2010   Previous cesarean delivery, antepartum condition or complication 08/11/2011   For repeat cesarean 09/10/11 AVS    Status post repeat low transverse cesarean section 09/11/2011   Uterine fibroid 10/1999   Past Surgical History:  Procedure Laterality Date   BREAST BIOPSY Left 03/05/2022   [place] LT BREAST BX W LOC DEV 1ST LESION IMG BX SPEC US GUIDE 03/05/2022 [facility]   CESAREAN SECTION     x2   COLONOSCOPY     MYOMECTOMY  07/2003   TUBAL LIGATION     UTERINE FIBROID SURGERY  2005    myomectomy x 1   Patient Active Problem List   Diagnosis Date Noted   Murmur 08/28/2021   Type 2 diabetes mellitus without complication, without long-term current use of insulin (HCC) 08/28/2021   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   Obesity    Hypertension    Heartburn in pregnancy    H/O polydactyly    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    AMA (advanced maternal age) multigravida 35+    Allergy    Left shoulder pain 10/03/2017   PCP NOTES >>> 02/17/2015   Fluttering sensation of heart 07/03/2013   Acid reflux 07/03/2013   Family history of colon cancer - father in 58's 03/24/2013   Annual physical exam 10/04/2012   HTN (hypertension) 09/14/2011   Status post repeat low transverse cesarean section 09/11/2011   Anemia 09/11/2011   H/O myomectomy 08/11/2011   Previous cesarean delivery, antepartum condition or complication 08/11/2011   Morbid obesity (HCC) 07/26/2011   Microadenoma 08/2010   Back pain 07/27/2010   History of colonic polyps 04/21/2010   Hx of hyperprolactinemia 08/2002   H/O infertility 2004   Uterine fibroid 10/1999   H/O chlamydia infection 04/07/1993  PCP: Geoffery Spruce PROVIDER: Dion Saucier  REFERRING DIAG: s/p Lt knee scope  THERAPY DIAG:  Acute pain of left knee  Muscle weakness (generalized)  Rationale for Evaluation and Treatment: Rehabilitation  ONSET DATE: 07/18/23  SUBJECTIVE:   SUBJECTIVE STATEMENT: Pt states that she had a knee scope 07/18/23 where they repaired her meniscus and removed some cartilage. She states that since surgery she has not had too much knee pain, she states it is hard to perform sit <> stand sometimes due to slight pain and weakness. She also reports pain occasionally at the end of the day. She feels her knee cap is "pulling in". Pain resolves with rest  PERTINENT HISTORY: Rt plantar fasciitis PAIN:  Are you having pain? Yes: NPRS scale:  1/10 Lt knee Pain location: Lt knee Pain description: ache Aggravating factors: sit to stand Relieving factors: rest  PRECAUTIONS: None  RED FLAGS: None   WEIGHT BEARING RESTRICTIONS: No  FALLS:  Has patient fallen in last 6 months? 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  DIAGNOSTIC FINDINGS: none on file  PATIENT SURVEYS:  KOOS Jr. 5/28  73%  COGNITION: Overall cognitive status: Within functional limits for tasks assessed      POSTURE: No Significant postural limitations  PALPATION: Patella mobility WFL Scar mobility mild hypomobility  LOWER EXTREMITY ROM:  Active ROM Right eval Left eval  Hip flexion    Hip extension    Hip abduction    Hip adduction    Hip internal rotation    Hip external rotation    Knee flexion 119 99  Knee extension 0 0  Ankle dorsiflexion    Ankle plantarflexion    Ankle inversion    Ankle eversion     (Blank rows = not tested)  LOWER EXTREMITY MMT:  MMT Right eval Left eval  Hip flexion 4 3+  Hip extension    Hip abduction 4 3+  Hip adduction    Hip internal rotation    Hip external rotation    Knee flexion 4 3+  Knee extension 4 4  Ankle dorsiflexion    Ankle plantarflexion    Ankle inversion    Ankle eversion     (Blank rows = not tested)    FUNCTIONAL TESTS:  5 times sit to stand: 20.32 seconds  GAIT: Distance walked: 100' Assistive device utilized: None Level of assistance: Complete Independence Comments: wide BOS, mild antalgic gait Stair negotiation: Step to pattern ascending and descending                                                                                                                                TREATMENT DATE: 08/09/23 See HEP Pt educated on PT POC and goals, HEP, scar massage, rationale for  treatment    PATIENT EDUCATION:  Education details: PT POC and goals, HEP, scar massage Person educated: Patient Education method: Explanation, Demonstration, and Handouts Education comprehension: verbalized understanding and returned demonstration  HOME EXERCISE PROGRAM: Access Code: [email or web address] URL: [email or web address] Date: 08/09/2023 Prepared by: Lowery Rue  Exercises - Hip Abduction with Resistance Loop  - 1 x daily - 7 x weekly - 3 sets - 10 reps - Hip Extension with Resistance Loop  - 1 x daily - 7 x weekly - 3 sets - 10 reps - Standing Hamstring Curl with Resistance  - 1 x daily - 7 x weekly - 3 sets - 10 reps - Supine Active Straight Leg Raise  - 1 x daily - 7 x weekly - 3 sets - 10 reps - Straight Leg Raise with External Rotation  - 1 x daily - 7 x weekly - 3 sets - 10 reps  Patient Education - Scar Massage  ASSESSMENT:  CLINICAL IMPRESSION: Patient is a 53 y.o. female who was seen today for physical therapy evaluation and treatment for s/p Lt knee scope. She presents with decreased strength and ROM and impaired gait. She will benefit from skilled physical therapy to address deficits and improve functional mobility.   OBJECTIVE IMPAIRMENTS: decreased activity tolerance, difficulty walking, decreased ROM, decreased strength, and pain.   ACTIVITY LIMITATIONS: stairs, transfers, and locomotion level  PARTICIPATION LIMITATIONS: community activity  PERSONAL FACTORS: Behavior pattern and Time since onset of injury/illness/exacerbation are also affecting patient's functional outcome.   REHAB POTENTIAL: Good  CLINICAL DECISION MAKING: Stable/uncomplicated  EVALUATION COMPLEXITY: Low   GOALS: Goals reviewed with patient? Yes  SHORT TERM GOALS: Target date: 09/06/2023   Pt will be independent with initial HEP Baseline: Goal status: INITIAL  2.  Pt will improve 5 x STS to <= 12 seconds to demo improved LE strength Baseline:  Goal status:  INITIAL    LONG TERM GOALS: Target date: 10/04/2023    Pt will be independent with advanced HEP and plan for fitness at home or in community Baseline:  Goal status: INITIAL  2.  Pt will improve KOOS Jr score to <= 1/28 to demo improved functional mobility Baseline:  Goal status: INITIAL  3.  Pt will improve Lt LE strength to 4+/5 to improve standing tolerance Baseline:  Goal status: INITIAL  4.  Pt will perform stair negotiation x 15 stairs with reciprocal pattern Baseline:  Goal status: INITIAL     PLAN:  PT FREQUENCY: 1x/week  PT DURATION: 8 weeks  PLANNED INTERVENTIONS: 97164- PT Re-evaluation, 97110-Therapeutic exercises, 97530- Therapeutic activity, 97112- Neuromuscular re-education, 97535- Self Care, 72536- Manual therapy, [id number]- Aquatic Therapy, [id number]- Electrical stimulation (unattended), 97016- Vasopneumatic device, [id number]- Ionotophoresis 4mg /ml Dexamethasone, Patient/Family education, Balance training, Taping, Dry Needling, Cryotherapy, and Moist heat  PLAN FOR NEXT SESSION: assess response to HEP, stair training, knee stability and strength   Shahil Speegle, PT 08/09/2023, 10:14 AM

## 2023-08-17 ENCOUNTER — Ambulatory Visit: Admitting: Physical Therapy

## 2023-08-17 ENCOUNTER — Encounter: Payer: Self-pay | Admitting: Physical Therapy

## 2023-08-17 DIAGNOSIS — M6281 Muscle weakness (generalized): Secondary | ICD-10-CM

## 2023-08-17 DIAGNOSIS — M25562 Pain in left knee: Secondary | ICD-10-CM

## 2023-08-17 NOTE — Therapy (Signed)
 OUTPATIENT PHYSICAL THERAPY LOWER EXTREMITY TREATMENT   Patient Name: Felicia Monroe MRN: [id number] DOB:[birth year], 53 y.o., female Today's Date: 08/17/2023  END OF SESSION:  PT End of Session - 08/17/23 0924     Visit Number 2    Number of Visits 8    Date for PT Re-Evaluation 10/04/23    Authorization Type [employer]    PT Start Time 0845    PT Stop Time 0924    PT Time Calculation (min) 39 min    Activity Tolerance Patient tolerated treatment well    Behavior During Therapy [facility] for tasks assessed/performed              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    Family history of colon cancer - father in 28's 03/24/2013   Brother and sister also have polyps    Fluttering sensation of heart 07/03/2013   H/O chlamydia infection 04/07/93   H/O infertility 2004   H/O myomectomy 08/11/2011   For repeat cesarean section    H/O polydactyly    Heartburn in pregnancy    HTN (hypertension) 09/14/2011   Hx of hyperprolactinemia 08/2002   Hypertension    Irregular periods/menstrual cycles    Left shoulder pain 10/03/2017   Microadenoma 08/2010   Morbid obesity (HCC) 07/26/2011   Obesity    Personal history of colonic adenomas 04/21/2010   Previous cesarean delivery, antepartum condition or complication 08/11/2011   For repeat cesarean 09/10/11 AVS    Status post repeat low transverse cesarean section 09/11/2011   Uterine fibroid 10/1999   Past Surgical History:  Procedure Laterality Date   BREAST BIOPSY Left 03/05/2022   US  LT BREAST BX W LOC DEV 1ST LESION IMG BX SPEC US  GUIDE 03/05/2022 [facility] MAMMOGRAPHY   CESAREAN SECTION     x2   COLONOSCOPY     MYOMECTOMY  07/2003   TUBAL LIGATION     UTERINE FIBROID SURGERY  2005    myomectomy x 1   Patient Active Problem List   Diagnosis Date Noted   Murmur 08/28/2021   Type 2 diabetes mellitus without complication, without long-term current use of insulin (HCC) 08/28/2021   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   Obesity    Hypertension    Heartburn in pregnancy    H/O polydactyly    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    AMA (advanced maternal age) multigravida 35+    Allergy    Left shoulder pain 10/03/2017   PCP NOTES >>> 02/17/2015   Fluttering sensation of heart 07/03/2013   Acid reflux 07/03/2013   Family history of colon cancer - father in 94's 03/24/2013   Annual physical exam 10/04/2012   HTN (hypertension) 09/14/2011   Status post repeat low transverse cesarean section 09/11/2011   Anemia 09/11/2011   H/O myomectomy 08/11/2011   Previous cesarean delivery, antepartum condition or complication 08/11/2011   Morbid obesity (HCC) 07/26/2011   Microadenoma 08/2010   Back pain 07/27/2010   History of colonic polyps 04/21/2010   Hx of hyperprolactinemia 08/2002   H/O infertility 2004   Uterine fibroid 10/1999   H/O chlamydia infection  04/07/1993    PCP: Dorena Gander  REFERRING PROVIDER: Karyl Paget  REFERRING DIAG: s/p Lt knee scope  THERAPY DIAG:  Acute pain of left knee  Muscle weakness (generalized)  Rationale for Evaluation and Treatment: Rehabilitation  ONSET DATE: 07/18/23  SUBJECTIVE:   SUBJECTIVE STATEMENT: Pt with no new complaints. She states she has no pain this morning  PERTINENT HISTORY: Rt plantar fasciitis  Pt states that she had a knee scope 07/18/23 where they repaired her meniscus and removed some cartilage. She states that since surgery she has not had too much knee pain, she states it is hard to perform sit <> stand sometimes due to slight pain and weakness. She also reports pain occasionally at the end of the day. She feels her knee cap is "pulling in". Pain  resolves with rest PAIN:  Are you having pain? Yes: NPRS scale: 0/10 Lt knee Pain location: Lt knee Pain description: ache Aggravating factors: sit to stand Relieving factors: rest  PRECAUTIONS: None  RED FLAGS: None   WEIGHT BEARING RESTRICTIONS: No  FALLS:  Has patient fallen in last 6 months? 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  DIAGNOSTIC FINDINGS: none on file  PATIENT SURVEYS:  KOOS Jr. 5/28  73%  COGNITION: Overall cognitive status: Within functional limits for tasks assessed      POSTURE: No Significant postural limitations  PALPATION: Patella mobility WFL Scar mobility mild hypomobility  LOWER EXTREMITY ROM:  Active ROM Right eval Left eval  Hip flexion    Hip extension    Hip abduction    Hip adduction    Hip internal rotation    Hip external rotation    Knee flexion 119 99  Knee extension 0 0  Ankle dorsiflexion    Ankle plantarflexion    Ankle inversion    Ankle eversion     (Blank rows = not tested)  LOWER EXTREMITY MMT:  MMT Right eval Left eval  Hip flexion 4 3+  Hip extension    Hip abduction 4 3+  Hip adduction    Hip internal rotation    Hip external rotation    Knee flexion 4 3+  Knee extension 4 4  Ankle dorsiflexion    Ankle plantarflexion    Ankle inversion    Ankle eversion     (Blank rows = not tested)    FUNCTIONAL TESTS:  5 times sit to stand: 20.32 seconds  GAIT: Distance walked: 100' Assistive device utilized: None Level of assistance: Complete Independence Comments: wide BOS, mild antalgic gait Stair negotiation: Step to pattern ascending and descending  [facility] PT Treatment:                                                 DATE: 08/17/23 Therapeutic Exercise/Activity: Nustep L6 x 5 min for AAROM and warm up SLR 2 x 10 SLR with ER x 10 Knee flexion heel slide with physioball x 10 Standing hip abd, ext, HS curl red TB 2 x 10 Standing heel raise 2 x 10 Step ups fwd and laterally 4'' step 2 x 10 , 2 UE support Tandem stance on foam 2 x 30 sec bilat Sit <> stand x 10 Seated knee flexion heel slide with hold at end range 10 sec x 5   TREATMENT DATE: 08/09/23 See HEP Pt educated on PT POC and goals, HEP, scar massage, rationale for treatment    PATIENT EDUCATION:  Education details: PT POC and goals, HEP, scar massage Person educated: Patient Education method: Explanation, Demonstration, and Handouts Education comprehension: verbalized understanding and returned demonstration  HOME EXERCISE PROGRAM: Access Code: [email or web address] URL: [email or web address] Date: 08/09/2023 Prepared by: Lowery Rue  Exercises - Hip Abduction with Resistance Loop  - 1 x daily - 7 x weekly - 3 sets - 10 reps - Hip Extension with Resistance Loop  - 1 x daily - 7 x weekly - 3 sets - 10 reps - Standing Hamstring Curl with Resistance  - 1 x daily - 7 x weekly - 3 sets - 10 reps - Supine Active Straight Leg Raise  - 1 x daily - 7 x weekly - 3 sets - 10 reps - Straight Leg Raise with External Rotation  - 1 x daily - 7 x weekly - 3 sets - 10 reps  Patient Education - Scar Massage  ASSESSMENT:  CLINICAL IMPRESSION: Pt with good tolerance to progression of exercises. She requires frequent rests during SLR due to muscle fatigue. She is able to tolerate 4'' steps without increase in pain. Pt with some discomfort with knee flexion but pain eases with out of position  OBJECTIVE IMPAIRMENTS: decreased activity tolerance, difficulty walking, decreased ROM, decreased strength, and pain.     GOALS: Goals reviewed with patient? Yes  SHORT TERM GOALS: Target date: 09/06/2023   Pt will be  independent with initial HEP Baseline: Goal status: INITIAL  2.  Pt will improve 5 x STS to <= 12 seconds to demo improved LE strength Baseline:  Goal status: INITIAL    LONG TERM GOALS: Target date: 10/04/2023    Pt will be independent with advanced HEP and plan for fitness at home or in community Baseline:  Goal status: INITIAL  2.  Pt will improve KOOS Jr score to <= 1/28 to demo improved functional mobility Baseline:  Goal status: INITIAL  3.  Pt will improve Lt LE strength to 4+/5 to improve standing tolerance Baseline:  Goal status: INITIAL  4.  Pt will perform stair negotiation x 15 stairs with reciprocal pattern Baseline:  Goal status: INITIAL     PLAN:  PT FREQUENCY: 1x/week  PT DURATION: 8 weeks  PLANNED INTERVENTIONS: 97164- PT Re-evaluation, 97110-Therapeutic exercises, 97530- Therapeutic activity, [id number]- Neuromuscular re-education, 97535- Self Care, [id number]- Manual therapy, [id number]- Aquatic Therapy, [id number]- Electrical stimulation (unattended), 97016- Vasopneumatic device, [id number]- Ionotophoresis 4mg /ml Dexamethasone, Patient/Family education, Balance training, Taping, Dry Needling, Cryotherapy, and Moist heat  PLAN FOR NEXT SESSION:  stair training, knee  stability and strength and endurance   Murry Diaz, PT 08/17/2023, 9:24 AM

## 2023-08-23 ENCOUNTER — Encounter: Payer: Self-pay | Admitting: Physical Therapy

## 2023-08-23 ENCOUNTER — Other Ambulatory Visit: Payer: Self-pay | Admitting: Cardiology

## 2023-08-23 ENCOUNTER — Ambulatory Visit: Admitting: Physical Therapy

## 2023-08-23 DIAGNOSIS — M25562 Pain in left knee: Secondary | ICD-10-CM | POA: Diagnosis not present

## 2023-08-23 DIAGNOSIS — M6281 Muscle weakness (generalized): Secondary | ICD-10-CM

## 2023-08-23 NOTE — Therapy (Signed)
 OUTPATIENT PHYSICAL THERAPY LOWER EXTREMITY TREATMENT   Patient Name: Felicia Monroe MRN: [id number] DOB:[birth year], 53 y.o., female Today's Date: 08/23/2023  END OF SESSION:  PT End of Session - 08/23/23 1018     Visit Number 3    Number of Visits 8    Date for PT Re-Evaluation 10/04/23    PT Start Time 0930    PT Stop Time 1010    PT Time Calculation (min) 40 min    Activity Tolerance Patient tolerated treatment well    Behavior During Therapy [facility] for tasks assessed/performed               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    Family history of colon cancer - father in 34's 03/24/2013   Brother and sister also have polyps    Fluttering sensation of heart 07/03/2013   H/O chlamydia infection 04/07/93   H/O infertility 2004   H/O myomectomy 08/11/2011   For repeat cesarean section    H/O polydactyly    Heartburn in pregnancy    HTN (hypertension) 09/14/2011   Hx of hyperprolactinemia 08/2002   Hypertension    Irregular periods/menstrual cycles    Left shoulder pain 10/03/2017   Microadenoma 08/2010   Morbid obesity (HCC) 07/26/2011   Obesity    Personal history of colonic adenomas 04/21/2010   Previous cesarean delivery, antepartum condition or complication 08/11/2011   For repeat cesarean 09/10/11 AVS    Status post repeat low transverse cesarean section 09/11/2011   Uterine fibroid 10/1999   Past Surgical History:  Procedure Laterality Date   BREAST BIOPSY Left 03/05/2022   US  LT BREAST BX W LOC DEV 1ST LESION IMG BX SPEC US  GUIDE 03/05/2022 [facility] MAMMOGRAPHY   CESAREAN SECTION     x2   COLONOSCOPY     MYOMECTOMY  07/2003   TUBAL LIGATION     UTERINE FIBROID SURGERY  2005   myomectomy x 1   Patient  Active Problem List   Diagnosis Date Noted   Murmur 08/28/2021   Type 2 diabetes mellitus without complication, without long-term current use of insulin (HCC) 08/28/2021   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   Obesity    Hypertension    Heartburn in pregnancy    H/O polydactyly    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    AMA (advanced maternal age) multigravida 35+    Allergy    Left shoulder pain 10/03/2017   PCP NOTES >>> 02/17/2015   Fluttering sensation of heart 07/03/2013   Acid reflux 07/03/2013   Family history of colon cancer - father in 62's 03/24/2013   Annual physical exam 10/04/2012   HTN (hypertension) 09/14/2011   Status post repeat low transverse cesarean section 09/11/2011   Anemia 09/11/2011   H/O myomectomy 08/11/2011   Previous cesarean delivery, antepartum condition or complication 08/11/2011   Morbid obesity (HCC) 07/26/2011   Microadenoma 08/2010   Back pain 07/27/2010   History of colonic polyps 04/21/2010   Hx of hyperprolactinemia 08/2002   H/O infertility 2004   Uterine fibroid 10/1999   H/O chlamydia infection 04/07/1993    PCP:  Dorena Gander  REFERRING PROVIDER: Karyl Paget  REFERRING DIAG: s/p Lt knee scope  THERAPY DIAG:  Acute pain of left knee  Muscle weakness (generalized)  Rationale for Evaluation and Treatment: Rehabilitation  ONSET DATE: 07/18/23  SUBJECTIVE:   SUBJECTIVE STATEMENT: Pt with no new complaints  PERTINENT HISTORY: Rt plantar fasciitis  Pt states that she had a knee scope 07/18/23 where they repaired her meniscus and removed some cartilage. She states that since surgery she has not had too much knee pain, she states it is hard to perform sit <> stand sometimes due to slight pain and weakness. She also reports pain occasionally at the end of the day. She feels her knee cap is "pulling in". Pain resolves with rest PAIN:  Are you having pain? Yes: NPRS scale:  0/10 Lt knee Pain location: Lt knee Pain description: ache Aggravating factors: sit to stand Relieving factors: rest  PRECAUTIONS: None  RED FLAGS: None   WEIGHT BEARING RESTRICTIONS: No  FALLS:  Has patient fallen in last 6 months? 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  DIAGNOSTIC FINDINGS: none on file  PATIENT SURVEYS:  KOOS Jr. 5/28  73%  COGNITION: Overall cognitive status: Within functional limits for tasks assessed      POSTURE: No Significant postural limitations  PALPATION: Patella mobility WFL Scar mobility mild hypomobility  LOWER EXTREMITY ROM:  Active ROM Right eval Left eval  Hip flexion    Hip extension    Hip abduction    Hip adduction    Hip internal rotation    Hip external rotation    Knee flexion 119 99  Knee extension 0 0  Ankle dorsiflexion    Ankle plantarflexion    Ankle inversion    Ankle eversion     (Blank rows = not tested)  LOWER EXTREMITY MMT:  MMT Right eval Left eval  Hip flexion 4 3+  Hip extension    Hip abduction 4 3+  Hip adduction    Hip internal rotation    Hip external rotation    Knee flexion 4 3+  Knee extension 4 4  Ankle dorsiflexion    Ankle plantarflexion    Ankle inversion    Ankle eversion     (Blank rows = not tested)    FUNCTIONAL TESTS:  5 times sit to stand: 20.32 seconds  GAIT: Distance walked: 100' Assistive device utilized: None Level of assistance: Complete Independence Comments: wide BOS, mild antalgic gait Stair negotiation: Step to pattern ascending and descending                                                                                                                                [facility] Adult PT Treatment:  DATE: 08/23/23 Therapeutic  Exercise/Activity: Nustep L6 x 5 min Step ups fwd 6'' step 1 UE support x 10 Lateral step downs 6'' step 1 UE support x 10 Standing hip abd, ext, HS curl green TB 2 x 10 Standing heel raise 2 x 10 SLR 2 x 10 SLR with ER x 10 SAQ 2# 2 x 10 Leg press 95# 2 x 10 Modified SL deadlift with kickstand 15# KB x 10 Sit <> stand 15#KB x 10   [facility] Adult PT Treatment:                                                DATE: 08/17/23 Therapeutic Exercise/Activity: Nustep L6 x 5 min for AAROM and warm up SLR 2 x 10 SLR with ER x 10 Knee flexion heel slide with physioball x 10 Standing hip abd, ext, HS curl red TB 2 x 10 Standing heel raise 2 x 10 Step ups fwd and laterally 4'' step 2 x 10 , 2 UE support Tandem stance on foam 2 x 30 sec bilat Sit <> stand x 10 Seated knee flexion heel slide with hold at end range 10 sec x 5   TREATMENT DATE: 08/09/23 See HEP Pt educated on PT POC and goals, HEP, scar massage, rationale for treatment    PATIENT EDUCATION:  Education details: PT POC and goals, HEP, scar massage Person educated: Patient Education method: Explanation, Demonstration, and Handouts Education comprehension: verbalized understanding and returned demonstration  HOME EXERCISE PROGRAM: Access Code: [email or web address] URL: [email or web address] Date: 08/23/2023 Prepared by: Lowery Rue  Exercises - Hip Abduction with Resistance Loop  - 1 x daily - 7 x weekly - 3 sets - 10 reps - Hip Extension with Resistance Loop  - 1 x daily - 7 x weekly - 3 sets - 10 reps - Standing Hamstring Curl with Resistance  - 1 x daily - 7 x weekly - 3 sets - 10 reps - Supine Active Straight Leg Raise  - 1 x daily - 7 x weekly - 3 sets - 10 reps - Straight Leg Raise with External Rotation  - 1 x daily - 7 x weekly - 3 sets - 10 reps - Modified Single-Leg Deadlift  - 1 x daily - 7 x weekly - 3 sets - 10 reps - Squat with Chair Touch  - 1 x daily - 7 x weekly - 3 sets - 10 reps  Patient  Education - Scar Massage  ASSESSMENT:  CLINICAL IMPRESSION: Pt with improving quad strength and is able to perform more SLR before fatigue. Still having difficulty with SLR with ER requiring frequent rests during this exercise. She has no pain throughout session, increased fatigue with sit <> stand with weight and leg press today. Updated HEP  OBJECTIVE IMPAIRMENTS: decreased activity tolerance, difficulty walking, decreased ROM, decreased strength, and pain.     GOALS: Goals reviewed with patient? Yes  SHORT TERM GOALS: Target date: 09/06/2023   Pt will be independent with initial HEP Baseline: Goal status: INITIAL  2.  Pt will improve 5 x STS to <= 12 seconds to demo improved LE strength Baseline:  Goal status: INITIAL    LONG TERM GOALS: Target date: 10/04/2023    Pt will be independent with advanced HEP and plan for fitness at home or in community Baseline:  Goal status: INITIAL  2.  Pt will improve KOOS Jr score to <= 1/28 to demo improved functional mobility Baseline:  Goal status: INITIAL  3.  Pt will improve Lt LE strength to 4+/5 to improve standing tolerance Baseline:  Goal status: INITIAL  4.  Pt will perform stair negotiation x 15 stairs with reciprocal pattern Baseline:  Goal status: INITIAL     PLAN:  PT FREQUENCY: 1x/week  PT DURATION: 8 weeks  PLANNED INTERVENTIONS: 97164- PT Re-evaluation, 97110-Therapeutic exercises, 97530- Therapeutic activity, 97112- Neuromuscular re-education, 97535- Self Care, 13244- Manual therapy, [id number]- Aquatic Therapy, W1027- Electrical stimulation (unattended), 97016- Vasopneumatic device, [id number]- Ionotophoresis 4mg /ml Dexamethasone, Patient/Family education, Balance training, Taping, Dry Needling, Cryotherapy, and Moist heat  PLAN FOR NEXT SESSION:  stair training, knee stability and strength and endurance   Erian Lariviere, PT 08/23/2023, 10:19 AM

## 2023-08-30 ENCOUNTER — Ambulatory Visit: Attending: Physician Assistant | Admitting: Physical Therapy

## 2023-08-30 ENCOUNTER — Encounter: Payer: Self-pay | Admitting: Physical Therapy

## 2023-08-30 DIAGNOSIS — M6281 Muscle weakness (generalized): Secondary | ICD-10-CM | POA: Diagnosis present

## 2023-08-30 DIAGNOSIS — M25562 Pain in left knee: Secondary | ICD-10-CM | POA: Insufficient documentation

## 2023-08-30 NOTE — Therapy (Signed)
 OUTPATIENT PHYSICAL THERAPY LOWER EXTREMITY TREATMENT   Patient Name: Felicia Monroe MRN: [id number] DOB:1970/05/20, 53 y.o., female Today's Date: 08/30/2023  END OF SESSION:  PT End of Session - 08/30/23 1222     Visit Number 4    Number of Visits 8    Date for PT Re-Evaluation 10/04/23    Authorization Type [employer]    PT Start Time 1145    PT Stop Time 1223    PT Time Calculation (min) 38 min    Activity Tolerance Patient tolerated treatment well    Behavior During Therapy WFL for tasks assessed/performed                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    Family history of colon cancer - father in 89's 03/24/2013   Brother and sister also have polyps    Fluttering sensation of heart 07/03/2013   H/O chlamydia infection 04/07/93   H/O infertility 2004   H/O myomectomy 08/11/2011   For repeat cesarean section    H/O polydactyly    Heartburn in pregnancy    HTN (hypertension) 09/14/2011   Hx of hyperprolactinemia 08/2002   Hypertension    Irregular periods/menstrual cycles    Left shoulder pain 10/03/2017   Microadenoma 08/2010   Morbid obesity (HCC) 07/26/2011   Obesity    Personal history of colonic adenomas 04/21/2010   Previous cesarean delivery, antepartum condition or complication 08/11/2011   For repeat cesarean 09/10/11 AVS    Status post repeat low transverse cesarean section 09/11/2011   Uterine fibroid 10/1999   Past Surgical History:  Procedure Laterality Date   BREAST BIOPSY Left 03/05/2022   US  LT BREAST BX W LOC DEV 1ST LESION IMG BX SPEC US  GUIDE 03/05/2022 [facility]AMMOGRAP[facility]   CESAREAN SECTION     x2   COLONOSCOPY     MYOMECTOMY  07/2003   TUBAL LIGATION     UTERINE FIBROID SURGERY  2005    myomectomy x 1   Patient Active Problem List   Diagnosis Date Noted   Murmur 08/28/2021   Type 2 diabetes mellitus without complication, without long-term current use of insulin (HCC) 08/28/2021   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   Obesity    Hypertension    Heartburn in pregnancy    H/O polydactyly    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    AMA (advanced maternal age) multigravida 35+    Allergy    Left shoulder pain 10/03/2017   PCP NOTES >>> 02/17/2015   Fluttering sensation of heart 07/03/2013   Acid reflux 07/03/2013   Family history of colon cancer - father in 28's 03/24/2013   Annual physical exam 10/04/2012   HTN (hypertension) 09/14/2011   Status post repeat low transverse cesarean section 09/11/2011   Anemia 09/11/2011   H/O myomectomy 08/11/2011   Previous cesarean delivery, antepartum condition or complication 08/11/2011   Morbid obesity (HCC) 07/26/2011   Microadenoma 08/2010   Back pain 07/27/2010   History of colonic polyps 04/21/2010   Hx of hyperprolactinemia 08/2002   H/O infertility 2004   Uterine fibroid 10/1999   H/O  chlamydia infection 04/07/1993    PCP: Dorena Gander  REFERRING PROVIDER: Karyl Paget  REFERRING DIAG: s/p Lt knee scope  THERAPY DIAG:  Acute pain of left knee  Muscle weakness (generalized)  Rationale for Evaluation and Treatment: Rehabilitation  ONSET DATE: 07/18/23  SUBJECTIVE:   SUBJECTIVE STATEMENT: Pt is more stiff today due to being on her feet and walking a lot of hills this weekend at a track meet  PERTINENT HISTORY: Rt plantar fasciitis  Pt states that she had a knee scope 07/18/23 where they repaired her meniscus and removed some cartilage. She states that since surgery she has not had too much knee pain, she states it is hard to perform sit <> stand sometimes due to slight pain and weakness. She also reports pain occasionally at the end of the day. She  feels her knee cap is "pulling in". Pain resolves with rest PAIN:  Are you having pain? Yes: NPRS scale: 1/10 Lt knee Pain location: Lt knee Pain description: stiff Aggravating factors: sit to stand Relieving factors: rest  PRECAUTIONS: None  RED FLAGS: None   WEIGHT BEARING RESTRICTIONS: No  FALLS:  Has patient fallen in last 6 months? 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  DIAGNOSTIC FINDINGS: none on file  PATIENT SURVEYS:  KOOS Jr. 5/28  73%  COGNITION: Overall cognitive status: Within functional limits for tasks assessed      POSTURE: No Significant postural limitations  PALPATION: Patella mobility WFL Scar mobility mild hypomobility  LOWER EXTREMITY ROM:  Active ROM Right eval Left eval Left 08/30/23  Hip flexion     Hip extension     Hip abduction     Hip adduction     Hip internal rotation     Hip external rotation     Knee flexion 119 99 110  Knee extension 0 0   Ankle dorsiflexion     Ankle plantarflexion     Ankle inversion     Ankle eversion      (Blank rows = not tested)  LOWER EXTREMITY MMT:  MMT Right eval Left eval Left 08/30/23  Hip flexion 4 3+ 3+  Hip extension     Hip abduction 4 3+   Hip adduction     Hip internal rotation     Hip external rotation     Knee flexion 4 3+ 4  Knee extension 4 4 4   Ankle dorsiflexion     Ankle plantarflexion     Ankle inversion     Ankle eversion      (Blank rows = not tested)    FUNCTIONAL TESTS:  5 times sit to stand: 20.32 seconds  (eval) 5 x STS 17.61 (08/30/23)  GAIT: Distance walked: 100' Assistive device utilized: None Level of assistance: Complete Independence Comments: wide BOS, mild antalgic gait Stair negotiation: Step to pattern ascending and descending  [facility] Adult PT Treatment:                                                DATE: 08/30/23 Therapeutic Exercise/Activity: Nustep L6 x 5 min 5 x STS 17.61 seconds ROM and strength measurements (see above) SLR 2 x 10 SAQ 2# 2 x 10 Standing hip abd, ext, HS curl green TB 2 x 10 Standing heel raise x 15 Modified SL deadlift with kickstand 15# KB x 10   [facility] Adult PT Treatment:                                                DATE: 08/23/23 Therapeutic Exercise/Activity: Nustep L6 x 5 min Step ups fwd 6'' step 1 UE support x 10 Lateral step downs 6'' step 1 UE support x 10 Standing hip abd, ext, HS curl green TB 2 x 10 Standing heel raise 2 x 10 SLR 2 x 10 SLR with ER x 10 SAQ 2# 2 x 10 Leg press 95# 2 x 10 Modified SL deadlift with kickstand 15# KB x 10 Sit <> stand 15#KB x 10   [facility] Adult PT Treatment:                                                DATE: 08/17/23 Therapeutic Exercise/Activity: Nustep L6 x 5 min for AAROM and warm up SLR 2 x 10 SLR with ER x 10 Knee flexion heel slide with physioball x 10 Standing hip abd, ext, HS curl red TB 2 x 10 Standing heel raise 2 x 10 Step ups fwd and laterally 4'' step 2 x 10 , 2 UE support Tandem stance on foam 2 x 30 sec bilat Sit <> stand x 10 Seated knee flexion heel slide with hold at end range 10 sec x 5  PATIENT EDUCATION:  Education details: PT POC and goals, HEP, scar massage Person educated: Patient Education method: Explanation, Demonstration, and Handouts Education comprehension: verbalized understanding and returned demonstration  HOME EXERCISE PROGRAM: Access Code: [email or web address] URL: [email or web address] Date: 08/23/2023 Prepared by: Lowery Rue  Exercises - Hip Abduction with Resistance Loop  - 1 x daily - 7 x weekly - 3 sets - 10 reps - Hip Extension with Resistance Loop  - 1 x daily - 7 x weekly - 3 sets - 10 reps -  Standing Hamstring Curl with Resistance  - 1 x daily - 7 x weekly - 3 sets - 10 reps - Supine Active Straight Leg Raise  - 1 x daily - 7 x weekly - 3 sets - 10 reps - Straight Leg Raise with External Rotation  - 1 x daily - 7 x weekly - 3 sets - 10 reps - Modified Single-Leg Deadlift  - 1 x daily - 7 x weekly - 3 sets - 10 reps - Squat with Chair Touch  - 1 x daily - 7 x weekly - 3 sets - 10 reps  Patient Education - Scar Massage  ASSESSMENT:  CLINICAL IMPRESSION: Held stairs and leg press today due to pt with increased pain due to walking  a lot this weekend. Pt has made good improvements in knee ROM. She still has quad weakness but is improving strength slowly  OBJECTIVE IMPAIRMENTS: decreased activity tolerance, difficulty walking, decreased ROM, decreased strength, and pain.     GOALS: Goals reviewed with patient? Yes  SHORT TERM GOALS: Target date: 09/06/2023   Pt will be independent with initial HEP Baseline: Goal status: MET  2.  Pt will improve 5 x STS to <= 12 seconds to demo improved LE strength Baseline:  Goal status: IN PROGRESS    LONG TERM GOALS: Target date: 10/04/2023    Pt will be independent with advanced HEP and plan for fitness at home or in community Baseline:  Goal status: INITIAL  2.  Pt will improve KOOS Jr score to <= 1/28 to demo improved functional mobility Baseline:  Goal status: INITIAL  3.  Pt will improve Lt LE strength to 4+/5 to improve standing tolerance Baseline:  Goal status: INITIAL  4.  Pt will perform stair negotiation x 15 stairs with reciprocal pattern Baseline:  Goal status: INITIAL     PLAN:  PT FREQUENCY: 1x/week  PT DURATION: 8 weeks  PLANNED INTERVENTIONS: 97164- PT Re-evaluation, 97110-Therapeutic exercises, 97530- Therapeutic activity, 97112- Neuromuscular re-education, 97535- Self Care, [id number]- Manual therapy, [id number]- Aquatic Therapy, [id number]- Electrical stimulation (unattended), 97016- Vasopneumatic device,  [id number]- Ionotophoresis 4mg /ml Dexamethasone, Patient/Family education, Balance training, Taping, Dry Needling, Cryotherapy, and Moist heat  PLAN FOR NEXT SESSION:  stair training, knee stability and strength and endurance   Artesia Berkey, PT 08/30/2023, 12:23 PM

## 2023-09-06 ENCOUNTER — Encounter: Payer: Self-pay | Admitting: Physical Therapy

## 2023-09-06 ENCOUNTER — Ambulatory Visit: Admitting: Physical Therapy

## 2023-09-06 DIAGNOSIS — M25562 Pain in left knee: Secondary | ICD-10-CM | POA: Diagnosis not present

## 2023-09-06 DIAGNOSIS — M6281 Muscle weakness (generalized): Secondary | ICD-10-CM

## 2023-09-06 NOTE — Therapy (Signed)
 OUTPATIENT PHYSICAL THERAPY LOWER EXTREMITY TREATMENT   Patient Name: Felicia Monroe MRN: [id number] DOB:[birth year], 53 y.o., female Today's Date: 09/06/2023  END OF SESSION:  PT End of Session - 09/06/23 0931     Visit Number 5    Number of Visits 8    Date for PT Re-Evaluation 10/04/23    Authorization Type [employer]    PT Start Time 0845    PT Stop Time 0928    PT Time Calculation (min) 43 min    Activity Tolerance Patient tolerated treatment well    Behavior During Therapy [facility] for tasks assessed/performed                 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    Family history of colon cancer - father in 17's 03/24/2013   Brother and sister also have polyps    Fluttering sensation of heart 07/03/2013   H/O chlamydia infection 04/07/93   H/O infertility 2004   H/O myomectomy 08/11/2011   For repeat cesarean section    H/O polydactyly    Heartburn in pregnancy    HTN (hypertension) 09/14/2011   Hx of hyperprolactinemia 08/2002   Hypertension    Irregular periods/menstrual cycles    Left shoulder pain 10/03/2017   Microadenoma 08/2010   Morbid obesity (HCC) 07/26/2011   Obesity    Personal history of colonic adenomas 04/21/2010   Previous cesarean delivery, antepartum condition or complication 08/11/2011   For repeat cesarean 09/10/11 AVS    Status post repeat low transverse cesarean section 09/11/2011   Uterine fibroid 10/1999   Past Surgical History:  Procedure Laterality Date   BREAST BIOPSY Left 03/05/2022   US  LT BREAST BX W LOC DEV 1ST LESION IMG BX SPEC US  GUIDE 03/05/2022 [facility]AMMOGRAP[facility]   CESAREAN SECTION     x2   COLONOSCOPY     MYOMECTOMY  07/2003   TUBAL LIGATION     UTERINE FIBROID SURGERY  2005    myomectomy x 1   Patient Active Problem List   Diagnosis Date Noted   Murmur 08/28/2021   Type 2 diabetes mellitus without complication, without long-term current use of insulin (HCC) 08/28/2021   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   Obesity    Hypertension    Heartburn in pregnancy    H/O polydactyly    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    AMA (advanced maternal age) multigravida 35+    Allergy    Left shoulder pain 10/03/2017   PCP NOTES >>> 02/17/2015   Fluttering sensation of heart 07/03/2013   Acid reflux 07/03/2013   Family history of colon cancer - father in 78's 03/24/2013   Annual physical exam 10/04/2012   HTN (hypertension) 09/14/2011   Status post repeat low transverse cesarean section 09/11/2011   Anemia 09/11/2011   H/O myomectomy 08/11/2011   Previous cesarean delivery, antepartum condition or complication 08/11/2011   Morbid obesity (HCC) 07/26/2011   Microadenoma 08/2010   Back pain 07/27/2010   History of colonic polyps 04/21/2010   Hx of hyperprolactinemia 08/2002   H/O infertility 2004   Uterine fibroid 10/1999  H/O chlamydia infection 04/07/1993    PCP: Dorena Gander  REFERRING PROVIDER: Karyl Paget  REFERRING DIAG: s/p Lt knee scope  THERAPY DIAG:  Acute pain of left knee  Muscle weakness (generalized)  Rationale for Evaluation and Treatment: Rehabilitation  ONSET DATE: 07/18/23  SUBJECTIVE:   SUBJECTIVE STATEMENT: Pt did a lot of walking again but states it wasn't as bad as last week. She saw MD who recommends 4 more weeks of PT  PERTINENT HISTORY: Rt plantar fasciitis  Pt states that she had a knee scope 07/18/23 where they repaired her meniscus and removed some cartilage. She states that since surgery she has not had too much knee pain, she states it is hard to perform sit <> stand sometimes due to slight pain and weakness. She also reports pain occasionally at the end of the  day. She feels her knee cap is "pulling in". Pain resolves with rest PAIN:  Are you having pain? Yes: NPRS scale: 1/10 Lt knee Pain location: Lt knee Pain description: stiff Aggravating factors: sit to stand Relieving factors: rest  PRECAUTIONS: None  RED FLAGS: None   WEIGHT BEARING RESTRICTIONS: No  FALLS:  Has patient fallen in last 6 months? 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  DIAGNOSTIC FINDINGS: none on file  PATIENT SURVEYS:  KOOS Jr. 5/28  73%  COGNITION: Overall cognitive status: Within functional limits for tasks assessed      POSTURE: No Significant postural limitations  PALPATION: Patella mobility WFL Scar mobility mild hypomobility  LOWER EXTREMITY ROM:  Active ROM Right eval Left eval Left 08/30/23  Hip flexion     Hip extension     Hip abduction     Hip adduction     Hip internal rotation     Hip external rotation     Knee flexion 119 99 110  Knee extension 0 0   Ankle dorsiflexion     Ankle plantarflexion     Ankle inversion     Ankle eversion      (Blank rows = not tested)  LOWER EXTREMITY MMT:  MMT Right eval Left eval Left 08/30/23  Hip flexion 4 3+ 3+  Hip extension     Hip abduction 4 3+   Hip adduction     Hip internal rotation     Hip external rotation     Knee flexion 4 3+ 4  Knee extension 4 4 4   Ankle dorsiflexion     Ankle plantarflexion     Ankle inversion     Ankle eversion      (Blank rows = not tested)    FUNCTIONAL TESTS:  5 times sit to stand: 20.32 seconds (eval) 5 x STS 17.61 (08/30/23)  GAIT: Distance walked: 100' Assistive device utilized: None Level of assistance: Complete Independence Comments: wide BOS, mild antalgic gait Stair negotiation: Step to pattern ascending and descending  [facility] Adult PT Treatment:                                                DATE: 09/06/23 Therapeutic Exercise/Activity: Nustep L6 x 5 min Mini squats --> mini squats on incline, decline Forward step downs 4'' step without UE support to focus on eccentric quad control Lt LE stance with Rt LE clock reach on slider -- difficult! SL leg press 45# focus on eccentric control x 20 SAQ 2.5# x 20 SLR 2 x 10 Sidelying hip circles CW/CCW 2 x 10 Sidelying hip adduction 2 x 10   [facility] Adult PT Treatment:                                                DATE: 08/30/23 Therapeutic Exercise/Activity: Nustep L6 x 5 min 5 x STS 17.61 seconds ROM and strength measurements (see above) SLR 2 x 10 SAQ 2# 2 x 10 Standing hip abd, ext, HS curl green TB 2 x 10 Standing heel raise x 15 Modified SL deadlift with kickstand 15# KB x 10   [facility] Adult PT Treatment:                                                DATE: 08/23/23 Therapeutic Exercise/Activity: Nustep L6 x 5 min Step ups fwd 6'' step 1 UE support x 10 Lateral step downs 6'' step 1 UE support x 10 Standing hip abd, ext, HS curl green TB 2 x 10 Standing heel raise 2 x 10 SLR 2 x 10 SLR with ER x 10 SAQ 2# 2 x 10 Leg press 95# 2 x 10 Modified SL deadlift with kickstand 15# KB x 10 Sit <> stand 15#KB x 10   PATIENT EDUCATION:  Education details: PT POC and goals, HEP, scar massage Person educated: Patient Education method: Explanation, Demonstration, and Handouts Education comprehension: verbalized understanding and returned demonstration  HOME EXERCISE PROGRAM: Access Code: [email or web address] URL: [email or web address] Date: 08/23/2023 Prepared by: Lowery Rue  Exercises - Hip Abduction with Resistance Loop  - 1 x daily - 7 x weekly - 3 sets - 10 reps - Hip Extension with Resistance Loop  - 1 x daily - 7 x weekly - 3 sets - 10 reps - Standing  Hamstring Curl with Resistance  - 1 x daily - 7 x weekly - 3 sets - 10 reps - Supine Active Straight Leg Raise  - 1 x daily - 7 x weekly - 3 sets - 10 reps - Straight Leg Raise with External Rotation  - 1 x daily - 7 x weekly - 3 sets - 10 reps - Modified Single-Leg Deadlift  - 1 x daily - 7 x weekly - 3 sets - 10 reps - Squat with Chair Touch  - 1 x daily - 7 x weekly - 3 sets - 10 reps  Patient Education - Scar Massage  ASSESSMENT:  CLINICAL IMPRESSION: Pt with decreased Lt knee stability and eccentric control. Addressed this with inclined squats and step downs. Encouraged pt to continue eccentric activities at home  OBJECTIVE IMPAIRMENTS: decreased activity  tolerance, difficulty walking, decreased ROM, decreased strength, and pain.     GOALS: Goals reviewed with patient? Yes  SHORT TERM GOALS: Target date: 09/06/2023   Pt will be independent with initial HEP Baseline: Goal status: MET  2.  Pt will improve 5 x STS to <= 12 seconds to demo improved LE strength Baseline:  Goal status: IN PROGRESS    LONG TERM GOALS: Target date: 10/04/2023    Pt will be independent with advanced HEP and plan for fitness at home or in community Baseline:  Goal status: INITIAL  2.  Pt will improve KOOS Jr score to <= 1/28 to demo improved functional mobility Baseline:  Goal status: INITIAL  3.  Pt will improve Lt LE strength to 4+/5 to improve standing tolerance Baseline:  Goal status: INITIAL  4.  Pt will perform stair negotiation x 15 stairs with reciprocal pattern Baseline:  Goal status: INITIAL     PLAN:  PT FREQUENCY: 1x/week  PT DURATION: 8 weeks  PLANNED INTERVENTIONS: 97164- PT Re-evaluation, 97110-Therapeutic exercises, 97530- Therapeutic activity, 97112- Neuromuscular re-education, 97535- Self Care, 84166- Manual therapy, [id number]- Aquatic Therapy, A6301- Electrical stimulation (unattended), 97016- Vasopneumatic device, 97033- Ionotophoresis 4mg /ml Dexamethasone,  Patient/Family education, Balance training, Taping, Dry Needling, Cryotherapy, and Moist heat  PLAN FOR NEXT SESSION:  UPDATE HEP, stair training, knee stability and strength and endurance   Teriah Muela, PT 09/06/2023, 9:32 AM

## 2023-09-13 ENCOUNTER — Encounter: Payer: Self-pay | Admitting: Physical Therapy

## 2023-09-13 ENCOUNTER — Ambulatory Visit: Admitting: Physical Therapy

## 2023-09-13 DIAGNOSIS — M6281 Muscle weakness (generalized): Secondary | ICD-10-CM

## 2023-09-13 DIAGNOSIS — M25562 Pain in left knee: Secondary | ICD-10-CM | POA: Diagnosis not present

## 2023-09-13 NOTE — Therapy (Signed)
 OUTPATIENT PHYSICAL THERAPY LOWER EXTREMITY TREATMENT   Patient Name: Felicia Monroe MRN: [id number] DOB:[birth year], 53 y.o., female Today's Date: 09/13/2023  END OF SESSION:  PT End of Session - 09/13/23 0925     Visit Number 6    Number of Visits 8    Date for PT Re-Evaluation 10/04/23    Authorization Type UHC    PT Start Time 0848    PT Stop Time 0926    PT Time Calculation (min) 38 min    Activity Tolerance Patient tolerated treatment well    Behavior During Therapy [facility] for tasks assessed/performed                  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    Family history of colon cancer - father in 45's 03/24/2013   Brother and sister also have polyps    Fluttering sensation of heart 07/03/2013   H/O chlamydia infection 04/07/93   H/O infertility 2004   H/O myomectomy 08/11/2011   For repeat cesarean section    H/O polydactyly    Heartburn in pregnancy    HTN (hypertension) 09/14/2011   Hx of hyperprolactinemia 08/2002   Hypertension    Irregular periods/menstrual cycles    Left shoulder pain 10/03/2017   Microadenoma 08/2010   Morbid obesity (HCC) 07/26/2011   Obesity    Personal history of colonic adenomas 04/21/2010   Previous cesarean delivery, antepartum condition or complication 08/11/2011   For repeat cesarean 09/10/11 AVS    Status post repeat low transverse cesarean section 09/11/2011   Uterine fibroid 10/1999   Past Surgical History:  Procedure Laterality Date   BREAST BIOPSY Left 03/05/2022   US  LT BREAST BX W LOC DEV 1ST LESION IMG BX SPEC US  GUIDE 03/05/2022 [facility] MAMMOGRAPHY   CESAREAN SECTION     x2   COLONOSCOPY     MYOMECTOMY  07/2003   TUBAL LIGATION     UTERINE FIBROID SURGERY  2005    myomectomy x 1   Patient Active Problem List   Diagnosis Date Noted   Murmur 08/28/2021   Type 2 diabetes mellitus without complication, without long-term current use of insulin (HCC) 08/28/2021   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   Obesity    Hypertension    Heartburn in pregnancy    H/O polydactyly    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    AMA (advanced maternal age) multigravida 35+    Allergy    Left shoulder pain 10/03/2017   PCP NOTES >>> 02/17/2015   Fluttering sensation of heart 07/03/2013   Acid reflux 07/03/2013   Family history of colon cancer - father in 11's 03/24/2013   Annual physical exam 10/04/2012   HTN (hypertension) 09/14/2011   Status post repeat low transverse cesarean section 09/11/2011   Anemia 09/11/2011   H/O myomectomy 08/11/2011   Previous cesarean delivery, antepartum condition or complication 08/11/2011   Morbid obesity (HCC) 07/26/2011   Microadenoma 08/2010   Back pain 07/27/2010   History of colonic polyps 04/21/2010   Hx of hyperprolactinemia 08/2002   H/O infertility 2004   Uterine fibroid 10/1999  H/O chlamydia infection 04/07/1993    PCP: Dorena Gander  REFERRING PROVIDER: Karyl Paget  REFERRING DIAG: s/p Lt knee scope  THERAPY DIAG:  Acute pain of left knee  Muscle weakness (generalized)  Rationale for Evaluation and Treatment: Rehabilitation  ONSET DATE: 07/18/23  SUBJECTIVE:   SUBJECTIVE STATEMENT: Pt states that after she does lots of walking and standing her knee feels "tight" but no pain  PERTINENT HISTORY: Rt plantar fasciitis  Pt states that she had a knee scope 07/18/23 where they repaired her meniscus and removed some cartilage. She states that since surgery she has not had too much knee pain, she states it is hard to perform sit <> stand sometimes due to slight pain and weakness. She also reports pain occasionally at the end of the day. She feels her  knee cap is "pulling in". Pain resolves with rest PAIN:  Are you having pain? Yes: NPRS scale: 1/10 Lt knee Pain location: Lt knee Pain description: stiff Aggravating factors: sit to stand Relieving factors: rest  PRECAUTIONS: None  RED FLAGS: None   WEIGHT BEARING RESTRICTIONS: No  FALLS:  Has patient fallen in last 6 months? 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  DIAGNOSTIC FINDINGS: none on file  PATIENT SURVEYS:  KOOS Jr. 5/28  73%  COGNITION: Overall cognitive status: Within functional limits for tasks assessed      POSTURE: No Significant postural limitations  PALPATION: Patella mobility WFL Scar mobility mild hypomobility  LOWER EXTREMITY ROM:  Active ROM Right eval Left eval Left 08/30/23  Hip flexion     Hip extension     Hip abduction     Hip adduction     Hip internal rotation     Hip external rotation     Knee flexion 119 99 110  Knee extension 0 0   Ankle dorsiflexion     Ankle plantarflexion     Ankle inversion     Ankle eversion      (Blank rows = not tested)  LOWER EXTREMITY MMT:  MMT Right eval Left eval Left 08/30/23  Hip flexion 4 3+ 3+  Hip extension     Hip abduction 4 3+   Hip adduction     Hip internal rotation     Hip external rotation     Knee flexion 4 3+ 4  Knee extension 4 4 4   Ankle dorsiflexion     Ankle plantarflexion     Ankle inversion     Ankle eversion      (Blank rows = not tested)    FUNCTIONAL TESTS:  5 times sit to stand: 20.32 seconds (eval) 5 x STS 17.61 (08/30/23)  GAIT: Distance walked: 100' Assistive device utilized: None Level of assistance: Complete Independence Comments: wide BOS, mild antalgic gait Stair negotiation: Step to pattern ascending and descending  [facility] Adult PT Treatment:                                                DATE: 09/12/23 Therapeutic Exercise/Activity: Nustep L6 x 5 min Mini squats on incline, decline --> increased difficulty with decline SL leg press 45# focus on eccentric control x 20 Lt LE stance with Rt LE clock reach on slide Modified deadlift black power band x 10 --> SL modified deadlift black power band x 10 SLR 3 x 10 Sidelying hip abd 2 x 10 Sidelying hip circles CW/CCW 2 x 10 Review updated HEP   [facility] Adult PT Treatment:                                                DATE: 09/06/23 Therapeutic Exercise/Activity: Nustep L6 x 5 min Mini squats --> mini squats on incline, decline Forward step downs 4'' step without UE support to focus on eccentric quad control Lt LE stance with Rt LE clock reach on slider -- difficult! SL leg press 45# focus on eccentric control x 20 SAQ 2.5# x 20 SLR 2 x 10 Sidelying hip circles CW/CCW 2 x 10 Sidelying hip adduction 2 x 10   [facility] Adult PT Treatment:                                                DATE: 08/30/23 Therapeutic Exercise/Activity: Nustep L6 x 5 min 5 x STS 17.61 seconds ROM and strength measurements (see above) SLR 2 x 10 SAQ 2# 2 x 10 Standing hip abd, ext, HS curl green TB 2 x 10 Standing heel raise x 15 Modified SL deadlift with kickstand 15# KB x 10   PATIENT EDUCATION:  Education details: PT POC and goals, HEP, scar massage Person educated: Patient Education method: Explanation, Demonstration, and Handouts Education comprehension: verbalized understanding and returned demonstration  HOME EXERCISE PROGRAM: Access Code: [email or web address] URL: [email or web address] Date: 09/13/2023 Prepared by: Lowery Rue  Exercises - Supine Active Straight Leg Raise  - 1 x daily - 7 x weekly - 3 sets - 10 reps - Straight Leg Raise with External Rotation  - 1 x daily - 7 x weekly  - 3 sets - 10 reps - Modified Single-Leg Deadlift  - 1 x daily - 7 x weekly - 3 sets - 10 reps - Squat with Chair Touch  - 1 x daily - 7 x weekly - 3 sets - 10 reps - Sidelying Hip Circles  - 1 x daily - 7 x weekly - 3 sets - 10 reps - Sidelying Hip Abduction  - 1 x daily - 7 x weekly - 3 sets - 10 reps  ASSESSMENT:  CLINICAL IMPRESSION: Updated HEP and encouraged pt to perform increased reps to progress knee endurance to decrease tightness and difficulty with standing and walking for long periods  OBJECTIVE IMPAIRMENTS: decreased activity tolerance, difficulty walking, decreased ROM, decreased strength, and pain.     GOALS: Goals reviewed with patient? Yes  SHORT TERM GOALS: Target date: 09/06/2023   Pt will be independent with initial HEP Baseline: Goal  status: MET  2.  Pt will improve 5 x STS to <= 12 seconds to demo improved LE strength Baseline:  Goal status: IN PROGRESS    LONG TERM GOALS: Target date: 10/04/2023    Pt will be independent with advanced HEP and plan for fitness at home or in community Baseline:  Goal status: INITIAL  2.  Pt will improve KOOS Jr score to <= 1/28 to demo improved functional mobility Baseline:  Goal status: INITIAL  3.  Pt will improve Lt LE strength to 4+/5 to improve standing tolerance Baseline:  Goal status: INITIAL  4.  Pt will perform stair negotiation x 15 stairs with reciprocal pattern Baseline:  Goal status: INITIAL     PLAN:  PT FREQUENCY: 1x/week  PT DURATION: 8 weeks  PLANNED INTERVENTIONS: 97164- PT Re-evaluation, 97110-Therapeutic exercises, 97530- Therapeutic activity, 97112- Neuromuscular re-education, 97535- Self Care, 98119- Manual therapy, [id number]- Aquatic Therapy, [id number]- Electrical stimulation (unattended), 97016- Vasopneumatic device, [id number]- Ionotophoresis 4mg /ml Dexamethasone, Patient/Family education, Balance training, Taping, Dry Needling, Cryotherapy, and Moist heat  PLAN FOR NEXT SESSION:   endurance, stair training, knee stability and strength and endurance   Tyre Beaver, PT 09/13/2023, 9:26 AM

## 2023-09-21 ENCOUNTER — Ambulatory Visit: Admitting: Physical Therapy

## 2023-09-21 ENCOUNTER — Encounter: Payer: Self-pay | Admitting: Physical Therapy

## 2023-09-21 DIAGNOSIS — M25562 Pain in left knee: Secondary | ICD-10-CM | POA: Diagnosis not present

## 2023-09-21 DIAGNOSIS — M6281 Muscle weakness (generalized): Secondary | ICD-10-CM

## 2023-09-21 NOTE — Therapy (Signed)
 OUTPATIENT PHYSICAL THERAPY LOWER EXTREMITY TREATMENT   Patient Name: Felicia Monroe MRN: [id number] DOB:1970-11-14, 53 y.o., female Today's Date: 09/21/2023  END OF SESSION:  PT End of Session - 09/21/23 0924     Visit Number 7    Number of Visits 8    Date for PT Re-Evaluation 10/04/23    Authorization Type UHC    PT Start Time 0850    PT Stop Time 0928    PT Time Calculation (min) 38 min    Activity Tolerance Patient tolerated treatment well    Behavior During Therapy [facility] for tasks assessed/performed                   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    Family history of colon cancer - father in 68's 03/24/2013   Brother and sister also have polyps    Fluttering sensation of heart 07/03/2013   H/O chlamydia infection 04/07/93   H/O infertility 2004   H/O myomectomy 08/11/2011   For repeat cesarean section    H/O polydactyly    Heartburn in pregnancy    HTN (hypertension) 09/14/2011   Hx of hyperprolactinemia 08/2002   Hypertension    Irregular periods/menstrual cycles    Left shoulder pain 10/03/2017   Microadenoma 08/2010   Morbid obesity (HCC) 07/26/2011   Obesity    Personal history of colonic adenomas 04/21/2010   Previous cesarean delivery, antepartum condition or complication 08/11/2011   For repeat cesarean 09/10/11 AVS    Status post repeat low transverse cesarean section 09/11/2011   Uterine fibroid 10/1999   Past Surgical History:  Procedure Laterality Date   BREAST BIOPSY Left 03/05/2022   US  LT BREAST BX W LOC DEV 1ST LESION IMG BX SPEC US  GUIDE 03/05/2022 [facility] MAMMOGRAPHY   CESAREAN SECTION     x2   COLONOSCOPY     MYOMECTOMY  07/2003   TUBAL LIGATION     UTERINE FIBROID SURGERY  2005    myomectomy x 1   Patient Active Problem List   Diagnosis Date Noted   Murmur 08/28/2021   Type 2 diabetes mellitus without complication, without long-term current use of insulin (HCC) 08/28/2021   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   Obesity    Hypertension    Heartburn in pregnancy    H/O polydactyly    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    AMA (advanced maternal age) multigravida 35+    Allergy    Left shoulder pain 10/03/2017   PCP NOTES >>> 02/17/2015   Fluttering sensation of heart 07/03/2013   Acid reflux 07/03/2013   Family history of colon cancer - father in 88's 03/24/2013   Annual physical exam 10/04/2012   HTN (hypertension) 09/14/2011   Status post repeat low transverse cesarean section 09/11/2011   Anemia 09/11/2011   H/O myomectomy 08/11/2011   Previous cesarean delivery, antepartum condition or complication 08/11/2011   Morbid obesity (HCC) 07/26/2011   Microadenoma 08/2010   Back pain 07/27/2010   History of colonic polyps 04/21/2010   Hx of hyperprolactinemia 08/2002   H/O infertility 2004   Uterine fibroid 10/1999  H/O chlamydia infection 04/07/1993    PCP: Dorena Gander  REFERRING PROVIDER: Karyl Paget  REFERRING DIAG: s/p Lt knee scope  THERAPY DIAG:  Acute pain of left knee  Muscle weakness (generalized)  Rationale for Evaluation and Treatment: Rehabilitation  ONSET DATE: 07/18/23  SUBJECTIVE:   SUBJECTIVE STATEMENT: Pt states she was stiff and sore after being on her feet a lot over the weekend but that she is feeling better now  PERTINENT HISTORY: Rt plantar fasciitis  Pt states that she had a knee scope 07/18/23 where they repaired her meniscus and removed some cartilage. She states that since surgery she has not had too much knee pain, she states it is hard to perform sit <> stand sometimes due to slight pain and weakness. She also reports pain occasionally at the end of  the day. She feels her knee cap is "pulling in". Pain resolves with rest PAIN:  Are you having pain? Yes: NPRS scale: 1/10 Lt knee Pain location: Lt knee Pain description: stiff Aggravating factors: sit to stand Relieving factors: rest  PRECAUTIONS: None  RED FLAGS: None   WEIGHT BEARING RESTRICTIONS: No  FALLS:  Has patient fallen in last 6 months? 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  DIAGNOSTIC FINDINGS: none on file  PATIENT SURVEYS:  KOOS Jr. 5/28  73%  COGNITION: Overall cognitive status: Within functional limits for tasks assessed      POSTURE: No Significant postural limitations  PALPATION: Patella mobility WFL Scar mobility mild hypomobility  LOWER EXTREMITY ROM:  Active ROM Right eval Left eval Left 08/30/23  Hip flexion     Hip extension     Hip abduction     Hip adduction     Hip internal rotation     Hip external rotation     Knee flexion 119 99 110  Knee extension 0 0   Ankle dorsiflexion     Ankle plantarflexion     Ankle inversion     Ankle eversion      (Blank rows = not tested)  LOWER EXTREMITY MMT:  MMT Right eval Left eval Left 08/30/23  Hip flexion 4 3+ 3+  Hip extension     Hip abduction 4 3+   Hip adduction     Hip internal rotation     Hip external rotation     Knee flexion 4 3+ 4  Knee extension 4 4 4   Ankle dorsiflexion     Ankle plantarflexion     Ankle inversion     Ankle eversion      (Blank rows = not tested)    FUNCTIONAL TESTS:  5 times sit to stand: 20.32 seconds (eval) 5 x STS 17.61 (08/30/23)  GAIT: Distance walked: 100' Assistive device utilized: None Level of assistance: Complete Independence Comments: wide BOS, mild antalgic gait Stair negotiation: Step to pattern ascending and descending    [facility] Adult PT Treatment:                                                 DATE: 09/21/23 Therapeutic Exercise/Activity: Nustep L6 x 5 min Runners step up 6'' without UE support x 10 Lateral step up 6'' step  x 10 SL leg press 65# x 20 Lt LE stance with Rt LE clock reach on slider Matrix 5# hip abd 3 x 5 Matrix 5# hip ext 2 x 10 SLR with hip abd/add over yoga block Sidelying hip circles CW/CCW 2 x 10 Sidelying hip adduction 2 x 10 Bridge with add squeeze 2 x 10 Sit <> stand 2 x 10 without UE support                                                                                                                              [facility] Adult PT Treatment:                                                DATE: 09/12/23 Therapeutic Exercise/Activity: Nustep L6 x 5 min Mini squats on incline, decline --> increased difficulty with decline SL leg press 45# focus on eccentric control x 20 Lt LE stance with Rt LE clock reach on slide Modified deadlift black power band x 10 --> SL modified deadlift black power band x 10 SLR 3 x 10 Sidelying hip abd 2 x 10 Sidelying hip circles CW/CCW 2 x 10 Review updated HEP   [facility] Adult PT Treatment:                                                DATE: 09/06/23 Therapeutic Exercise/Activity: Nustep L6 x 5 min Mini squats --> mini squats on incline, decline Forward step downs 4'' step without UE support to focus on eccentric quad control Lt LE stance with Rt LE clock reach on slider -- difficult! SL leg press 45# focus on eccentric control x 20 SAQ 2.5# x 20 SLR 2 x 10 Sidelying hip circles CW/CCW 2 x 10 Sidelying hip adduction 2 x 10   PATIENT EDUCATION:  Education details: PT POC and goals, HEP, scar massage Person educated: Patient Education method: Explanation, Demonstration, and Handouts Education comprehension: verbalized understanding and returned demonstration  HOME EXERCISE PROGRAM: Access Code: [email or web address] URL: [email or web address] Date: 09/13/2023 Prepared  by: Lowery Rue  Exercises - Supine Active Straight Leg Raise  - 1 x daily - 7 x weekly - 3 sets - 10 reps - Straight Leg Raise with External Rotation  - 1 x daily - 7 x weekly - 3 sets - 10 reps - Modified Single-Leg Deadlift  - 1 x daily - 7 x weekly - 3 sets - 10 reps - Squat with Chair Touch  - 1 x daily - 7 x weekly - 3 sets - 10 reps - Sidelying Hip Circles  - 1 x daily - 7 x weekly - 3 sets - 10 reps - Sidelying Hip Abduction  -  1 x daily - 7 x weekly - 3 sets - 10 reps  ASSESSMENT:  CLINICAL IMPRESSION: Pt continues with notable hip abductor weakness - PT emphasized importance of continuing hip strengthening at home. Pt better able to tolerate stair negotiation today with less knee pain  OBJECTIVE IMPAIRMENTS: decreased activity tolerance, difficulty walking, decreased ROM, decreased strength, and pain.     GOALS: Goals reviewed with patient? Yes  SHORT TERM GOALS: Target date: 09/06/2023   Pt will be independent with initial HEP Baseline: Goal status: MET  2.  Pt will improve 5 x STS to <= 12 seconds to demo improved LE strength Baseline:  Goal status: IN PROGRESS    LONG TERM GOALS: Target date: 10/04/2023    Pt will be independent with advanced HEP and plan for fitness at home or in community Baseline:  Goal status: INITIAL  2.  Pt will improve KOOS Jr score to <= 1/28 to demo improved functional mobility Baseline:  Goal status: INITIAL  3.  Pt will improve Lt LE strength to 4+/5 to improve standing tolerance Baseline:  Goal status: INITIAL  4.  Pt will perform stair negotiation x 15 stairs with reciprocal pattern Baseline:  Goal status: INITIAL     PLAN:  PT FREQUENCY: 1x/week  PT DURATION: 8 weeks  PLANNED INTERVENTIONS: 97164- PT Re-evaluation, 97110-Therapeutic exercises, 97530- Therapeutic activity, 97112- Neuromuscular re-education, 97535- Self Care, 84696- Manual therapy, [id number]- Aquatic Therapy, [id number]- Electrical stimulation  (unattended), 97016- Vasopneumatic device, [id number]- Ionotophoresis 4mg /ml Dexamethasone, Patient/Family education, Balance training, Taping, Dry Needling, Cryotherapy, and Moist heat  PLAN FOR NEXT SESSION:  check goals, d/c   Adana Marik, PT 09/21/2023, 9:24 AM

## 2023-09-27 ENCOUNTER — Ambulatory Visit: Attending: Physician Assistant | Admitting: Physical Therapy

## 2023-09-27 ENCOUNTER — Encounter: Payer: Self-pay | Admitting: Physical Therapy

## 2023-09-27 DIAGNOSIS — M6281 Muscle weakness (generalized): Secondary | ICD-10-CM

## 2023-09-27 DIAGNOSIS — M25562 Pain in left knee: Secondary | ICD-10-CM

## 2023-09-27 NOTE — Therapy (Signed)
 OUTPATIENT PHYSICAL THERAPY LOWER EXTREMITY TREATMENT AND DISCHARGE   Patient Name: Felicia Monroe MRN: [id number] DOB:August 11, 1970, 53 y.o., female Today's Date: 09/27/2023  END OF SESSION:  PT End of Session - 09/27/23 0924     Visit Number 8    Number of Visits 8    Date for PT Re-Evaluation 10/04/23    Authorization Type [employer]    PT Start Time 0848    PT Stop Time 0926    PT Time Calculation (min) 38 min    Activity Tolerance Patient tolerated treatment well    Behavior During Therapy [facility] for tasks assessed/performed                    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    Family history of colon cancer - father in 32's 03/24/2013   Brother and sister also have polyps    Fluttering sensation of heart 07/03/2013   H/O chlamydia infection 04/07/93   H/O infertility 2004   H/O myomectomy 08/11/2011   For repeat cesarean section    H/O polydactyly    Heartburn in pregnancy    HTN (hypertension) 09/14/2011   Hx of hyperprolactinemia 08/2002   Hypertension    Irregular periods/menstrual cycles    Left shoulder pain 10/03/2017   Microadenoma 08/2010   Morbid obesity (HCC) 07/26/2011   Obesity    Personal history of colonic adenomas 04/21/2010   Previous cesarean delivery, antepartum condition or complication 08/11/2011   For repeat cesarean 09/10/11 AVS    Status post repeat low transverse cesarean section 09/11/2011   Uterine fibroid 10/1999   Past Surgical History:  Procedure Laterality Date   BREAST BIOPSY Left 03/05/2022   US  LT BREAST BX W LOC DEV 1ST LESION IMG BX SPEC US  GUIDE 03/05/2022 [facility]   CESAREAN SECTION     x2   COLONOSCOPY     MYOMECTOMY  07/2003   TUBAL LIGATION     UTERINE FIBROID  SURGERY  2005   myomectomy x 1   Patient Active Problem List   Diagnosis Date Noted   Murmur 08/28/2021   Type 2 diabetes mellitus without complication, without long-term current use of insulin (HCC) 08/28/2021   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   Obesity    Hypertension    Heartburn in pregnancy    H/O polydactyly    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    AMA (advanced maternal age) multigravida 35+    Allergy    Left shoulder pain 10/03/2017   PCP NOTES >>> 02/17/2015   Fluttering sensation of heart 07/03/2013   Acid reflux 07/03/2013   Family history of colon cancer - father in 7's 03/24/2013   Annual physical exam 10/04/2012   HTN (hypertension) 09/14/2011   Status post repeat low transverse cesarean section 09/11/2011   Anemia 09/11/2011   H/O myomectomy 08/11/2011   Previous cesarean delivery, antepartum condition or complication 08/11/2011   Morbid obesity (HCC) 07/26/2011   Microadenoma 08/2010   Back pain 07/27/2010   History of colonic polyps 04/21/2010   Hx of hyperprolactinemia 08/2002   H/O infertility 2004  Uterine fibroid 10/1999   H/O chlamydia infection 04/07/1993    PCP: Dorena Gander  REFERRING PROVIDER: Karyl Paget  REFERRING DIAG: s/p Lt knee scope  THERAPY DIAG:  Acute pain of left knee  Muscle weakness (generalized)  Rationale for Evaluation and Treatment: Rehabilitation  ONSET DATE: 07/18/23  SUBJECTIVE:   SUBJECTIVE STATEMENT: Pt states she was stiff and sore after being on her feet a lot over the weekend but that she is feeling better now  PERTINENT HISTORY: Rt plantar fasciitis  Pt states that she had a knee scope 07/18/23 where they repaired her meniscus and removed some cartilage. She states that since surgery she has not had too much knee pain, she states it is hard to perform sit <> stand sometimes due to slight pain and weakness. She also reports pain occasionally at  the end of the day. She feels her knee cap is "pulling in". Pain resolves with rest PAIN:  Are you having pain? Yes: NPRS scale: 1/10 Lt knee Pain location: Lt knee Pain description: stiff Aggravating factors: sit to stand Relieving factors: rest  PRECAUTIONS: None  RED FLAGS: None   WEIGHT BEARING RESTRICTIONS: No  FALLS:  Has patient fallen in last 6 months? 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  DIAGNOSTIC FINDINGS: none on file  PATIENT SURVEYS:  KOOS Jr. 5/28  73%  COGNITION: Overall cognitive status: Within functional limits for tasks assessed      POSTURE: No Significant postural limitations  PALPATION: Patella mobility WFL Scar mobility mild hypomobility  LOWER EXTREMITY ROM:  Active ROM Right eval Left eval Left 08/30/23  Hip flexion     Hip extension     Hip abduction     Hip adduction     Hip internal rotation     Hip external rotation     Knee flexion 119 99 110  Knee extension 0 0   Ankle dorsiflexion     Ankle plantarflexion     Ankle inversion     Ankle eversion      (Blank rows = not tested)  LOWER EXTREMITY MMT:  MMT Right eval Left eval Left 08/30/23 Left 09/27/23  Hip flexion 4 3+ 3+ 4  Hip extension      Hip abduction 4 3+  3+  Hip adduction      Hip internal rotation      Hip external rotation      Knee flexion 4 3+ 4 4+  Knee extension 4 4 4  4+  Ankle dorsiflexion      Ankle plantarflexion      Ankle inversion      Ankle eversion       (Blank rows = not tested)    FUNCTIONAL TESTS:  5 times sit to stand: 20.32 seconds (eval) 5 x STS 17.61 (08/30/23) 5 x STS 13.09 seconds (09/27/23)  GAIT: Distance walked: 100' Assistive device utilized: None Level of assistance: Complete Independence Comments: wide BOS, mild antalgic gait Stair  negotiation: Step to pattern ascending and descending   [facility] Adult PT Treatment:  DATE: 09/27/23 Therapeutic Exercise/Activity: Nustep L6 x 5 min 5 x STS = 13.09 seconds Stair negotiation with step to pattern descending, reciprocal pattern ascending Strength testing (see above) Sidelying hip abd 2 x 10 Sidelying hip circles CW/CCW 2 x 10 SLR with hip abd/add over yoga block KOOS -12 79.1%    [facility] Adult PT Treatment:                                                DATE: 09/21/23 Therapeutic Exercise/Activity: Nustep L6 x 5 min Runners step up 6'' without UE support x 10 Lateral step up 6'' step x 10 SL leg press 65# x 20 Lt LE stance with Rt LE clock reach on slider Matrix 5# hip abd 3 x 5 Matrix 5# hip ext 2 x 10 SLR with hip abd/add over yoga block Sidelying hip circles CW/CCW 2 x 10 Sidelying hip adduction 2 x 10 Bridge with add squeeze 2 x 10 Sit <> stand 2 x 10 without UE support                                                                                                                              [facility] Adult PT Treatment:                                                DATE: 09/12/23 Therapeutic Exercise/Activity: Nustep L6 x 5 min Mini squats on incline, decline --> increased difficulty with decline SL leg press 45# focus on eccentric control x 20 Lt LE stance with Rt LE clock reach on slide Modified deadlift black power band x 10 --> SL modified deadlift black power band x 10 SLR 3 x 10 Sidelying hip abd 2 x 10 Sidelying hip circles CW/CCW 2 x 10 Review updated HEP   PATIENT EDUCATION:  Education details: PT POC and goals, HEP, scar massage Person educated: Patient Education method: Explanation, Demonstration, and Handouts Education comprehension: verbalized understanding and returned demonstration  HOME EXERCISE PROGRAM: Access Code: [email or web address] URL: [email or web address] Date: 09/13/2023 Prepared by:  Lowery Rue  Exercises - Supine Active Straight Leg Raise  - 1 x daily - 7 x weekly - 3 sets - 10 reps - Straight Leg Raise with External Rotation  - 1 x daily - 7 x weekly - 3 sets - 10 reps - Modified Single-Leg Deadlift  - 1 x daily - 7 x weekly - 3 sets - 10 reps - Squat with Chair Touch  - 1 x daily - 7 x weekly - 3 sets - 10 reps - Sidelying Hip Circles  - 1 x daily - 7 x weekly - 3 sets - 10 reps - Sidelying Hip  Abduction  - 1 x daily - 7 x weekly - 3 sets - 10 reps  ASSESSMENT:  CLINICAL IMPRESSION: Pt has improved strength and functional activity tolerance. She continues with weakness in hip abduction and eccentric knee control but she is independent in HEP to continued to strengthen at home. She feels ready for d/c to HEP  OBJECTIVE IMPAIRMENTS: decreased activity tolerance, difficulty walking, decreased ROM, decreased strength, and pain.     GOALS: Goals reviewed with patient? Yes  SHORT TERM GOALS: Target date: 09/06/2023   Pt will be independent with initial HEP Baseline: Goal status: MET  2.  Pt will improve 5 x STS to <= 12 seconds to demo improved LE strength Baseline:  Goal status: IN PROGRESS    LONG TERM GOALS: Target date: 10/04/2023    Pt will be independent with advanced HEP and plan for fitness at home or in community Baseline:  Goal status: MET  2.  Pt will improve KOOS Jr score to <= 1/28 to demo improved functional mobility Baseline:  Goal status: IN PROGRESS  3.  Pt will improve Lt LE strength to 4+/5 to improve standing tolerance Baseline: see above Goal status: IN PROGRESS  4.  Pt will perform stair negotiation x 15 stairs with reciprocal pattern Baseline:  Goal status: IN PROGRESS     PLAN:  PT FREQUENCY: 1x/week  PT DURATION: 8 weeks  PLANNED INTERVENTIONS: 97164- PT Re-evaluation, 97110-Therapeutic exercises, 97530- Therapeutic activity, 97112- Neuromuscular re-education, 97535- Self Care, 91478- Manual therapy, [phone]-  Aquatic Therapy, Z3086- Electrical stimulation (unattended), 97016- Vasopneumatic device, 97033- Ionotophoresis 4mg /ml Dexamethasone, Patient/Family education, Balance training, Taping, Dry Needling, Cryotherapy, and Moist heat  PLAN FOR NEXT SESSION: d/c   Chasity Outten, PT 09/27/2023, 9:24 AM  PHYSICAL THERAPY DISCHARGE SUMMARY  Visits from Start of Care: 8  Current functional level related to goals / functional outcomes: Improving knee strength and functional mobility   Remaining deficits: See above   Education / Equipment: HEP   Patient agrees to discharge. Patient goals were partially met. Patient is being discharged due to being pleased with the current functional level.

## 2023-12-20 ENCOUNTER — Other Ambulatory Visit: Payer: Self-pay | Admitting: Obstetrics and Gynecology

## 2023-12-20 DIAGNOSIS — Z1231 Encounter for screening mammogram for malignant neoplasm of breast: Secondary | ICD-10-CM

## 2024-02-02 ENCOUNTER — Other Ambulatory Visit: Payer: Self-pay | Admitting: Obstetrics and Gynecology

## 2024-02-02 DIAGNOSIS — Z1231 Encounter for screening mammogram for malignant neoplasm of breast: Secondary | ICD-10-CM

## 2024-02-19 ENCOUNTER — Other Ambulatory Visit: Payer: Self-pay | Admitting: Cardiology

## 2024-02-23 ENCOUNTER — Ambulatory Visit (HOSPITAL_BASED_OUTPATIENT_CLINIC_OR_DEPARTMENT_OTHER)

## 2024-02-28 ENCOUNTER — Ambulatory Visit

## 2024-02-28 DIAGNOSIS — Z1231 Encounter for screening mammogram for malignant neoplasm of breast: Secondary | ICD-10-CM | POA: Diagnosis not present

## 2024-03-27 ENCOUNTER — Ambulatory Visit (AMBULATORY_SURGERY_CENTER)

## 2024-03-27 VITALS — Ht 65.0 in | Wt 285.6 lb

## 2024-03-27 DIAGNOSIS — Z800 Family history of malignant neoplasm of digestive organs: Secondary | ICD-10-CM

## 2024-03-27 DIAGNOSIS — Z860100 Personal history of colon polyps, unspecified: Secondary | ICD-10-CM

## 2024-03-27 NOTE — Progress Notes (Signed)

## 2024-04-03 ENCOUNTER — Encounter: Payer: Self-pay | Admitting: Internal Medicine

## 2024-04-09 NOTE — Progress Notes (Unsigned)
 [facility] History and Physical   Primary Care Physician:  Rolinda Millman, MD   Reason for Procedure:    Encounter Diagnoses  Name Primary?   History of colonic polyps Yes   Family history of colon cancer - father in 69's      Plan:    colonoscopy   The patient was provided an opportunity to ask questions and all were answered. The patient agreed with the plan.   HPI: Felicia Monroe is a 53 y.o. female with a personal hx colon polyps and family history of colon cancer in her father. Last exam 2019 - no polyps. 03/2010 - diminutive aenoma and diminutive sessile serrated adenoma 08/29/2013 3 diminutive polyps adenoma x 1 and ssp x 2 - 07/19/2017 no polyps -   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    Family history of colon cancer - father in 27's 03/24/2013   Brother and sister also have polyps    Fluttering sensation of heart 07/03/2013   H/O chlamydia infection 04/07/93   H/O infertility 2004   H/O myomectomy 08/11/2011   For repeat cesarean section    H/O polydactyly    Heartburn in pregnancy    HTN (hypertension) 09/14/2011   Hx of hyperprolactinemia 08/2002   Hypertension    Irregular periods/menstrual cycles    Left shoulder pain 10/03/2017   Microadenoma 08/2010   Morbid obesity (HCC) 07/26/2011   Obesity    Personal history of colonic adenomas 04/21/2010   Previous cesarean delivery, antepartum condition or complication 08/11/2011   For repeat cesarean 09/10/11 AVS    Status post repeat low transverse cesarean section 09/11/2011   Uterine fibroid 10/1999    Past Surgical History:  Procedure Laterality Date   BREAST BIOPSY Left 03/05/2022   US  LT BREAST BX W LOC DEV 1ST LESION IMG BX SPEC US  GUIDE  03/05/2022 [facility]AMMOGRAP[facility]   CESAREAN SECTION     x2   COLONOSCOPY     KNEE SURGERY Left 07/18/2023   And miniscus repair   MYOMECTOMY  07/2003   TUBAL LIGATION     UTERINE FIBROID SURGERY  2005   myomectomy x 1     Current Outpatient Medications  Medication Sig Dispense Refill   amLODipine  (NORVASC ) 2.5 MG tablet TAKE 1 TABLET BY MOUTH EVERY DAY 90 tablet 1   empagliflozin  (JARDIANCE ) 10 MG TABS tablet Take 1 tablet (10 mg total) by mouth daily before breakfast. 30 tablet 3   fexofenadine  (ALLEGRA  ALLERGY) 180 MG tablet Take 1 tablet (180 mg total) by mouth daily for 15 days. (Patient not taking: Reported on 03/27/2024) 15 tablet 0   fexofenadine  (ALLEGRA  ALLERGY) 180 MG tablet Take 1 tablet (180 mg total) by mouth daily for 15 days. (Patient not taking: Reported on 03/27/2024) 15 tablet 0   fluticasone  (FLONASE ) 50 MCG/ACT nasal spray Place 1 spray into both nostrils 2 (two) times daily as needed for up to 7 days for allergies or rhinitis. 9.9 mL 2   glucose blood test strip Use as instructed to check blood sugar up to BID 100 each 12   Lancets 30G MISC 1 each by Does not apply route 2 (two)  times daily. 100 each PRN   naproxen (NAPROSYN) 500 MG tablet Take 1 tablet by mouth 2 (two) times daily.     predniSONE  (STERAPRED UNI-PAK 21 TAB) 10 MG (21) TBPK tablet Take by mouth daily. Take 6 tabs by mouth daily  for 2 days, then 5 tabs for 2 days, then 4 tabs for 2 days, then 3 tabs for 2 days, 2 tabs for 2 days, then 1 tab by mouth daily for 2 days (Patient not taking: Reported on 03/27/2024) 42 tablet 0   predniSONE  (STERAPRED UNI-PAK 21 TAB) 10 MG (21) TBPK tablet Take by mouth daily. Take 6 tabs by mouth daily  for 2 days, then 5 tabs for 2 days, then 4 tabs for 2 days, then 3 tabs for 2 days, 2 tabs for 2 days, then 1 tab by mouth daily for 2 days (Patient not taking: Reported on 03/27/2024) 42 tablet 0   promethazine -dextromethorphan (PROMETHAZINE -DM) 6.25-15 MG/5ML syrup Take 5 mLs by  mouth 3 (three) times daily as needed for cough. (Patient not taking: Reported on 03/27/2024) 118 mL 0   triamterene -hydrochlorothiazide  (MAXZIDE-25) 37.5-25 MG tablet TAKE 1 TABLET BY MOUTH DAILY (Patient not taking: Reported on 03/27/2024) 90 tablet 1   valsartan-hydrochlorothiazide  (DIOVAN-HCT) 80-12.5 MG tablet Take 1 tablet by mouth daily.     No current facility-administered medications for this visit.    Allergies as of 04/10/2024 - Review Complete 03/27/2024  Allergen Reaction Noted   Metformin  and related Rash and Other (See Comments) 03/27/2024    Family History  Problem Relation Age of Onset   Colon cancer Father 59           Hypertension Father    Colon polyps Brother        x 2   Hypertension Mother    Diabetes Mother    Colon polyps Sister    Coronary artery disease Other        GM, aunt   Diabetes Sister    Heart disease Maternal Grandmother    Diabetes Maternal Grandmother    Breast cancer Neg Hx    Esophageal cancer Neg Hx    Rectal cancer Neg Hx    Stomach cancer Neg Hx     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[employer]- [employer]   Tobacco Use   Smoking status: Never   Smokeless tobacco: Never  Vaping Use   Vaping status: Never Used  Substance and Sexual Activity   Alcohol use: Yes    Comment: socially   Drug use: No   Sexual activity: Not Currently    Birth control/protection: Surgical    Comment: BTL  Other Topics Concern   Not on file  Social History Narrative   household- pt, husband, children   Two children  2006, 2013   Social Drivers of Health   Tobacco Use: Low Risk (03/27/2024)   Patient History    Smoking Tobacco Use: Never    Smokeless Tobacco Use: Never    Passive Exposure: Not on file  Financial Resource Strain: Not on file  Food  Insecurity: Not on file  Transportation Needs: Not on file  Physical Activity: Not on file  Stress: Not on file  Social Connections: Unknown (09/06/2021)   Received from [facility]   Social Network    Social Network: Not on file  Intimate Partner Violence: Unknown (07/29/2021)   Received from [facility]   HITS    Physically Hurt: Not on file    Insult or Talk Down To: Not on file    Threaten Physical Harm: Not on file    Scream or Curse: Not on file  Depression (PHQ2-9): Low Risk (07/01/2021)   Depression (PHQ2-9)    PHQ-2 Score: 0  Alcohol Screen: Not on file  Housing: Not on file  Utilities: Not on file  Health Literacy: Not on file    Review of Systems: Positive for *** All other review of systems negative except as mentioned in the HPI.  Physical Exam: Vital signs There were no vitals taken for this visit.  General:   Alert,  Well-developed, well-nourished, pleasant and cooperative in NAD Lungs:  Clear throughout to auscultation.   Heart:  Regular rate and rhythm; no murmurs, clicks, rubs,  or gallops. Abdomen:  Soft, nontender and nondistended. Normal bowel sounds.   Neuro/Psych:  Alert and cooperative. Normal mood and affect. A and O x 3   @Glenyce Randle  CHARLENA Commander, MD, [facility] [phone] (pager) 04/09/2024 7:33 PM@

## 2024-04-10 ENCOUNTER — Encounter: Payer: Self-pay | Admitting: Internal Medicine

## 2024-04-10 ENCOUNTER — Ambulatory Visit: Admitting: Internal Medicine

## 2024-04-10 VITALS — BP 146/62 | HR 74 | Temp 97.7°F | Resp 11 | Ht 65.0 in | Wt 285.6 lb

## 2024-04-10 DIAGNOSIS — Z1211 Encounter for screening for malignant neoplasm of colon: Secondary | ICD-10-CM

## 2024-04-10 DIAGNOSIS — Z860101 Personal history of adenomatous and serrated colon polyps: Secondary | ICD-10-CM

## 2024-04-10 DIAGNOSIS — Z860100 Personal history of colon polyps, unspecified: Secondary | ICD-10-CM

## 2024-04-10 DIAGNOSIS — Z800 Family history of malignant neoplasm of digestive organs: Secondary | ICD-10-CM

## 2024-04-10 MED ORDER — SODIUM CHLORIDE 0.9 % IV SOLN: 500.0000 mL | INTRAVENOUS | Status: DC

## 2024-04-10 NOTE — Progress Notes (Signed)
 Pt's states no medical or surgical changes since previsit or office visit.

## 2024-04-10 NOTE — Op Note (Signed)
 [facility] Patient Name: Felicia Monroe Procedure Date: 04/10/2024 10:04 AM MRN: [id number] Endoscopist: Lupita FORBES Commander , MD, [id number] Age: 53 Referring MD:  Date of Birth: May 08, 1970 Gender: Female Account #: [id number] Procedure:                Colonoscopy Indications:              High risk colon cancer surveillance: Personal                            history of colonic polyps, Last colonoscopy: 2019 Medicines:                Monitored Anesthesia Care Procedure:                Pre-Anesthesia Assessment:                           - Prior to the procedure, a History and Physical                            was performed, and patient medications and                            allergies were reviewed. The patient's tolerance of                            previous anesthesia was also reviewed. The risks                            and benefits of the procedure and the sedation                            options and risks were discussed with the patient.                            All questions were answered, and informed consent                            was obtained. Prior Anticoagulants: The patient has                            taken no anticoagulant or antiplatelet agents. ASA                            Grade Assessment: III - A patient with severe                            systemic disease. After reviewing the risks and                            benefits, the patient was deemed in satisfactory                            condition to undergo the procedure.  After obtaining informed consent, the colonoscope                            was passed under direct vision. Throughout the                            procedure, the patient's blood pressure, pulse, and                            oxygen saturations were monitored continuously. The                            CF [id number] #[id number] was introduced through the anus                            and  advanced to the the cecum, identified by                            appendiceal orifice and ileocecal valve. The                            colonoscopy was performed without difficulty. The                            patient tolerated the procedure well. The quality                            of the bowel preparation was good. The ileocecal                            valve, appendiceal orifice, and rectum were                            photographed. The bowel preparation used was                            Miralax via split dose instruction. Scope In: 11:03:51 AM Scope Out: 11:13:22 AM Scope Withdrawal Time: 0 hours 6 minutes 54 seconds  Total Procedure Duration: 0 hours 9 minutes 31 seconds  Findings:                 The perianal and digital rectal examinations were                            normal.                           The entire examined colon appeared normal on direct                            and retroflexion views. Complications:            No immediate complications. Estimated Blood Loss:     Estimated blood loss: none. Impression:               - The entire examined  colon is normal on direct and                            retroflexion views.                           - No specimens collected.                           - Personal hx polyps and family history colon                            cancer (brother)                           -2019 - no polyps.                           03/2010 - diminutive aenoma and diminutive sessile                            serrated adenoma                           08/29/2013 3 diminutive polyps adenoma x 1 and ssp x                            2 -                           07/19/2017 no polyps - Recommendation:           - Patient has a contact number available for                            emergencies. The signs and symptoms of potential                            delayed complications were discussed with the                             patient. Return to normal activities tomorrow.                            Written discharge instructions were provided to the                            patient.                           - Resume previous diet.                           - Continue present medications.                           - Repeat colonoscopy in 5 years. Lupita FORBES Commander, MD 04/10/2024 11:21:36 AM This report has been  signed electronically.

## 2024-04-10 NOTE — Progress Notes (Signed)
 Report to PACU, RN, vss, BBS= Clear.

## 2024-04-10 NOTE — Patient Instructions (Addendum)
 No polyps again today.  Based upon your polyp and family history you should repeat a colonoscopy in approximately 5 years.  I appreciate the opportunity care for you. Felicia CHARLENA Commander, MD, FACG  YOU HAD AN ENDOSCOPIC PROCEDURE TODAY AT THE [facility]:   Refer to the procedure report that was given to you for any specific questions about what was found during the examination.  If the procedure report does not answer your questions, please call your gastroenterologist to clarify.  If you requested that your care partner not be given the details of your procedure findings, then the procedure report has been included in a sealed envelope for you to review at your convenience later.  YOU SHOULD EXPECT: Some feelings of bloating in the abdomen. Passage of more gas than usual.  Walking can help get rid of the air that was put into your GI tract during the procedure and reduce the bloating. If you had a lower endoscopy (such as a colonoscopy or flexible sigmoidoscopy) you may notice spotting of blood in your stool or on the toilet paper. If you underwent a bowel prep for your procedure, you may not have a normal bowel movement for a few days.  Please Note:  You might notice some irritation and congestion in your nose or some drainage.  This is from the oxygen used during your procedure.  There is no need for concern and it should clear up in a day or so.  SYMPTOMS TO REPORT IMMEDIATELY:  Following lower endoscopy (colonoscopy or flexible sigmoidoscopy):  Excessive amounts of blood in the stool  Significant tenderness or worsening of abdominal pains  Swelling of the abdomen that is new, acute  Fever of 100F or higher  For urgent or emergent issues, a gastroenterologist can be reached at any hour by calling [phone]. Do not use MyChart messaging for urgent concerns.    DIET:  We do recommend a small meal at first, but then you may proceed to your regular diet.  Drink plenty of  fluids but you should avoid alcoholic beverages for 24 hours.  ACTIVITY:  You should plan to take it easy for the rest of today and you should NOT DRIVE or use heavy machinery until tomorrow (because of the sedation medicines used during the test).    FOLLOW UP: Our staff will call the number listed on your records the next business day following your procedure.  We will call around 7:15- 8:00 am to check on you and address any questions or concerns that you may have regarding the information given to you following your procedure. If we do not reach you, we will leave a message.     If any biopsies were taken you will be contacted by phone or by letter within the next 1-3 weeks.  Please call us  at [phone] if you have not heard about the biopsies in 3 weeks.    SIGNATURES/CONFIDENTIALITY: You and/or your care partner have signed paperwork which will be entered into your electronic medical record.  These signatures attest to the fact that that the information above on your After Visit Summary has been reviewed and is understood.  Full responsibility of the confidentiality of this discharge information lies with you and/or your care-partner.

## 2024-04-11 ENCOUNTER — Telehealth: Payer: Self-pay

## 2024-04-11 NOTE — Telephone Encounter (Signed)
°  Follow up Call-     04/10/2024   10:12 AM  Call back number  Post procedure Call Back phone  # [phone]  Permission to leave phone message Yes     Patient questions:  Do you have a fever, pain , or abdominal swelling? No. Pain Score  0 *  Have you tolerated food without any problems? Yes.    Have you been able to return to your normal activities? Yes.    Do you have any questions about your discharge instructions: Diet   No. Medications  No. Follow up visit  No.  Do you have questions or concerns about your Care? No.  Actions: * If pain score is 4 or above: No action needed, pain <4.
